# Patient Record
Sex: Male | Born: 1945
Health system: Southern US, Community
[De-identification: ages and names within clinical notes are randomized; demographics above are authoritative.]

## PROBLEM LIST (undated history)

## (undated) DIAGNOSIS — Z9889 Other specified postprocedural states: Secondary | ICD-10-CM

## (undated) DIAGNOSIS — G4733 Obstructive sleep apnea (adult) (pediatric): Secondary | ICD-10-CM

## (undated) DIAGNOSIS — K5732 Diverticulitis of large intestine without perforation or abscess without bleeding: Secondary | ICD-10-CM

## (undated) DIAGNOSIS — G4730 Sleep apnea, unspecified: Secondary | ICD-10-CM

## (undated) DIAGNOSIS — F528 Other sexual dysfunction not due to a substance or known physiological condition: Secondary | ICD-10-CM

## (undated) DIAGNOSIS — K439 Ventral hernia without obstruction or gangrene: Secondary | ICD-10-CM

## (undated) DIAGNOSIS — C61 Malignant neoplasm of prostate: Secondary | ICD-10-CM

## (undated) DIAGNOSIS — I739 Peripheral vascular disease, unspecified: Secondary | ICD-10-CM

## (undated) DIAGNOSIS — E785 Hyperlipidemia, unspecified: Secondary | ICD-10-CM

## (undated) DIAGNOSIS — R5383 Other fatigue: Secondary | ICD-10-CM

## (undated) DIAGNOSIS — M545 Low back pain, unspecified: Secondary | ICD-10-CM

## (undated) DIAGNOSIS — N183 Chronic kidney disease, stage 3 unspecified: Secondary | ICD-10-CM

## (undated) DIAGNOSIS — K219 Gastro-esophageal reflux disease without esophagitis: Secondary | ICD-10-CM

## (undated) DIAGNOSIS — M129 Arthropathy, unspecified: Secondary | ICD-10-CM

## (undated) DIAGNOSIS — R5381 Other malaise: Secondary | ICD-10-CM

## (undated) DIAGNOSIS — L732 Hidradenitis suppurativa: Secondary | ICD-10-CM

## (undated) DIAGNOSIS — I7389 Other specified peripheral vascular diseases: Secondary | ICD-10-CM

## (undated) DIAGNOSIS — I10 Essential (primary) hypertension: Secondary | ICD-10-CM

## (undated) DIAGNOSIS — I252 Old myocardial infarction: Secondary | ICD-10-CM

## (undated) DIAGNOSIS — E669 Obesity, unspecified: Secondary | ICD-10-CM

## (undated) DIAGNOSIS — E1149 Type 2 diabetes mellitus with other diabetic neurological complication: Secondary | ICD-10-CM

## (undated) DIAGNOSIS — I2510 Atherosclerotic heart disease of native coronary artery without angina pectoris: Secondary | ICD-10-CM

## (undated) HISTORY — DX: Other malaise: R53.81

## (undated) HISTORY — PX: APPENDECTOMY: SHX54

## (undated) HISTORY — DX: Obstructive sleep apnea (adult) (pediatric): G47.33

## (undated) HISTORY — DX: Old myocardial infarction: I25.2

## (undated) HISTORY — DX: Low back pain: M54.5

## (undated) HISTORY — DX: Other sexual dysfunction not due to a substance or known physiological condition: F52.8

## (undated) HISTORY — DX: Peripheral vascular disease, unspecified: I73.9

## (undated) HISTORY — PX: ABDOMINAL SURGERY: SHX537

## (undated) HISTORY — DX: Obesity, unspecified: E66.9

## (undated) HISTORY — DX: Malignant neoplasm of prostate: C61

## (undated) HISTORY — DX: Other fatigue: R53.83

## (undated) HISTORY — DX: Essential (primary) hypertension: I10

## (undated) HISTORY — DX: Diverticulitis of large intestine without perforation or abscess without bleeding: K57.32

## (undated) HISTORY — DX: Chronic kidney disease, stage 3 unspecified: N18.30

## (undated) HISTORY — DX: Sleep apnea, unspecified: G47.30

## (undated) HISTORY — DX: Ventral hernia without obstruction or gangrene: K43.9

## (undated) HISTORY — DX: Atherosclerotic heart disease of native coronary artery without angina pectoris: I25.10

## (undated) HISTORY — DX: Low back pain, unspecified: M54.50

## (undated) HISTORY — DX: Other specified postprocedural states: Z98.890

## (undated) HISTORY — DX: Chronic kidney disease, stage 3 (moderate): N18.3

## (undated) HISTORY — DX: Arthropathy, unspecified: M12.9

## (undated) HISTORY — DX: Other specified peripheral vascular diseases: I73.89

## (undated) HISTORY — PX: HERNIA REPAIR: SHX51

## (undated) HISTORY — DX: Type 2 diabetes mellitus with other diabetic neurological complication: E11.49

## (undated) HISTORY — PX: WRIST SURGERY: SHX841

## (undated) HISTORY — DX: Hyperlipidemia, unspecified: E78.5

---

## 1998-03-04 ENCOUNTER — Encounter: Admission: RE | Admit: 1998-03-04 | Discharge: 1998-03-04 | Payer: Self-pay | Admitting: Family Medicine

## 1998-03-07 ENCOUNTER — Encounter: Admission: RE | Admit: 1998-03-07 | Discharge: 1998-03-07 | Payer: Self-pay | Admitting: Family Medicine

## 1998-03-13 ENCOUNTER — Encounter: Admission: RE | Admit: 1998-03-13 | Discharge: 1998-03-13 | Payer: Self-pay | Admitting: Family Medicine

## 1998-03-24 ENCOUNTER — Encounter: Admission: RE | Admit: 1998-03-24 | Discharge: 1998-03-24 | Payer: Self-pay | Admitting: Family Medicine

## 1998-06-30 ENCOUNTER — Encounter: Admission: RE | Admit: 1998-06-30 | Discharge: 1998-06-30 | Payer: Self-pay | Admitting: Family Medicine

## 1998-09-12 ENCOUNTER — Encounter: Admission: RE | Admit: 1998-09-12 | Discharge: 1998-09-12 | Payer: Self-pay | Admitting: Family Medicine

## 1998-11-01 HISTORY — PX: JOINT REPLACEMENT: SHX530

## 1998-11-01 HISTORY — PX: TOTAL KNEE ARTHROPLASTY: SHX125

## 1998-12-18 ENCOUNTER — Encounter: Admission: RE | Admit: 1998-12-18 | Discharge: 1998-12-18 | Payer: Self-pay | Admitting: Family Medicine

## 1999-01-02 ENCOUNTER — Encounter: Admission: RE | Admit: 1999-01-02 | Discharge: 1999-01-02 | Payer: Self-pay | Admitting: Family Medicine

## 1999-01-21 ENCOUNTER — Encounter: Admission: RE | Admit: 1999-01-21 | Discharge: 1999-01-21 | Payer: Self-pay | Admitting: Family Medicine

## 1999-05-08 ENCOUNTER — Encounter: Admission: RE | Admit: 1999-05-08 | Discharge: 1999-05-08 | Payer: Self-pay | Admitting: Family Medicine

## 1999-10-14 ENCOUNTER — Encounter: Payer: Self-pay | Admitting: Orthopedic Surgery

## 1999-10-15 ENCOUNTER — Encounter: Admission: RE | Admit: 1999-10-15 | Discharge: 1999-10-15 | Payer: Self-pay | Admitting: Family Medicine

## 1999-10-19 ENCOUNTER — Encounter: Payer: Self-pay | Admitting: Orthopedic Surgery

## 1999-10-19 ENCOUNTER — Inpatient Hospital Stay (HOSPITAL_COMMUNITY): Admission: RE | Admit: 1999-10-19 | Discharge: 1999-10-23 | Payer: Self-pay | Admitting: Orthopedic Surgery

## 1999-10-23 ENCOUNTER — Inpatient Hospital Stay (HOSPITAL_COMMUNITY)
Admission: RE | Admit: 1999-10-23 | Discharge: 1999-10-28 | Payer: Self-pay | Admitting: Physical Medicine & Rehabilitation

## 1999-11-02 DIAGNOSIS — I252 Old myocardial infarction: Secondary | ICD-10-CM

## 1999-11-02 HISTORY — DX: Old myocardial infarction: I25.2

## 1999-11-20 ENCOUNTER — Encounter: Admission: RE | Admit: 1999-11-20 | Discharge: 1999-11-20 | Payer: Self-pay | Admitting: Family Medicine

## 1999-12-01 ENCOUNTER — Encounter: Admission: RE | Admit: 1999-12-01 | Discharge: 1999-12-01 | Payer: Self-pay | Admitting: Sports Medicine

## 1999-12-02 ENCOUNTER — Encounter: Admission: RE | Admit: 1999-12-02 | Discharge: 2000-01-01 | Payer: Self-pay | Admitting: Neurology

## 2000-01-11 ENCOUNTER — Encounter: Admission: RE | Admit: 2000-01-11 | Discharge: 2000-01-11 | Payer: Self-pay | Admitting: Family Medicine

## 2000-02-29 ENCOUNTER — Inpatient Hospital Stay (HOSPITAL_COMMUNITY): Admission: EM | Admit: 2000-02-29 | Discharge: 2000-03-04 | Payer: Self-pay | Admitting: Emergency Medicine

## 2000-02-29 ENCOUNTER — Encounter: Payer: Self-pay | Admitting: Emergency Medicine

## 2000-04-06 ENCOUNTER — Encounter: Admission: RE | Admit: 2000-04-06 | Discharge: 2000-04-06 | Payer: Self-pay | Admitting: Family Medicine

## 2000-10-03 ENCOUNTER — Encounter: Admission: RE | Admit: 2000-10-03 | Discharge: 2000-10-03 | Payer: Self-pay | Admitting: Family Medicine

## 2001-01-30 ENCOUNTER — Encounter: Admission: RE | Admit: 2001-01-30 | Discharge: 2001-01-30 | Payer: Self-pay | Admitting: Family Medicine

## 2001-06-30 ENCOUNTER — Encounter: Admission: RE | Admit: 2001-06-30 | Discharge: 2001-06-30 | Payer: Self-pay | Admitting: Family Medicine

## 2001-07-11 ENCOUNTER — Encounter: Admission: RE | Admit: 2001-07-11 | Discharge: 2001-07-11 | Payer: Self-pay | Admitting: Family Medicine

## 2001-10-09 ENCOUNTER — Encounter: Admission: RE | Admit: 2001-10-09 | Discharge: 2001-10-09 | Payer: Self-pay | Admitting: Family Medicine

## 2001-10-09 ENCOUNTER — Encounter: Admission: RE | Admit: 2001-10-09 | Discharge: 2001-10-09 | Payer: Self-pay | Admitting: Sports Medicine

## 2002-01-24 ENCOUNTER — Encounter: Admission: RE | Admit: 2002-01-24 | Discharge: 2002-01-24 | Payer: Self-pay | Admitting: Family Medicine

## 2002-01-30 ENCOUNTER — Encounter: Admission: RE | Admit: 2002-01-30 | Discharge: 2002-01-30 | Payer: Self-pay | Admitting: Family Medicine

## 2002-02-13 ENCOUNTER — Encounter: Admission: RE | Admit: 2002-02-13 | Discharge: 2002-02-13 | Payer: Self-pay | Admitting: Family Medicine

## 2002-06-15 ENCOUNTER — Encounter: Admission: RE | Admit: 2002-06-15 | Discharge: 2002-06-15 | Payer: Self-pay | Admitting: Family Medicine

## 2002-07-30 ENCOUNTER — Encounter: Admission: RE | Admit: 2002-07-30 | Discharge: 2002-07-30 | Payer: Self-pay | Admitting: Family Medicine

## 2002-08-15 ENCOUNTER — Ambulatory Visit (HOSPITAL_COMMUNITY): Admission: RE | Admit: 2002-08-15 | Discharge: 2002-08-15 | Payer: Self-pay | Admitting: *Deleted

## 2002-08-15 ENCOUNTER — Encounter (INDEPENDENT_AMBULATORY_CARE_PROVIDER_SITE_OTHER): Payer: Self-pay | Admitting: Specialist

## 2002-11-30 ENCOUNTER — Encounter: Admission: RE | Admit: 2002-11-30 | Discharge: 2002-11-30 | Payer: Self-pay | Admitting: Family Medicine

## 2002-12-21 ENCOUNTER — Encounter: Admission: RE | Admit: 2002-12-21 | Discharge: 2002-12-21 | Payer: Self-pay | Admitting: Family Medicine

## 2003-01-17 ENCOUNTER — Encounter: Admission: RE | Admit: 2003-01-17 | Discharge: 2003-01-17 | Payer: Self-pay | Admitting: Family Medicine

## 2003-02-18 ENCOUNTER — Encounter: Admission: RE | Admit: 2003-02-18 | Discharge: 2003-02-18 | Payer: Self-pay | Admitting: Family Medicine

## 2003-07-31 ENCOUNTER — Encounter: Admission: RE | Admit: 2003-07-31 | Discharge: 2003-07-31 | Payer: Self-pay | Admitting: Family Medicine

## 2003-08-09 ENCOUNTER — Encounter: Admission: RE | Admit: 2003-08-09 | Discharge: 2003-08-09 | Payer: Self-pay | Admitting: Sports Medicine

## 2004-02-24 ENCOUNTER — Encounter: Admission: RE | Admit: 2004-02-24 | Discharge: 2004-02-24 | Payer: Self-pay | Admitting: Family Medicine

## 2004-02-24 ENCOUNTER — Ambulatory Visit (HOSPITAL_COMMUNITY): Admission: RE | Admit: 2004-02-24 | Discharge: 2004-02-24 | Payer: Self-pay | Admitting: Sports Medicine

## 2004-02-25 ENCOUNTER — Encounter: Admission: RE | Admit: 2004-02-25 | Discharge: 2004-02-25 | Payer: Self-pay | Admitting: Sports Medicine

## 2004-02-26 ENCOUNTER — Encounter: Admission: RE | Admit: 2004-02-26 | Discharge: 2004-02-26 | Payer: Self-pay | Admitting: Family Medicine

## 2004-03-03 ENCOUNTER — Encounter: Admission: RE | Admit: 2004-03-03 | Discharge: 2004-03-03 | Payer: Self-pay | Admitting: Family Medicine

## 2004-07-20 ENCOUNTER — Ambulatory Visit: Payer: Self-pay | Admitting: Family Medicine

## 2005-05-06 ENCOUNTER — Ambulatory Visit: Payer: Self-pay | Admitting: Family Medicine

## 2005-06-03 ENCOUNTER — Ambulatory Visit: Payer: Self-pay | Admitting: Sports Medicine

## 2005-06-15 ENCOUNTER — Emergency Department (HOSPITAL_COMMUNITY): Admission: EM | Admit: 2005-06-15 | Discharge: 2005-06-15 | Payer: Self-pay | Admitting: Emergency Medicine

## 2005-09-17 ENCOUNTER — Ambulatory Visit: Payer: Self-pay | Admitting: Family Medicine

## 2006-03-11 ENCOUNTER — Ambulatory Visit: Payer: Self-pay | Admitting: Family Medicine

## 2006-03-30 ENCOUNTER — Ambulatory Visit: Payer: Self-pay | Admitting: Sports Medicine

## 2006-04-18 ENCOUNTER — Ambulatory Visit: Payer: Self-pay | Admitting: Family Medicine

## 2006-05-03 ENCOUNTER — Ambulatory Visit: Payer: Self-pay | Admitting: Sports Medicine

## 2006-06-09 ENCOUNTER — Ambulatory Visit (HOSPITAL_COMMUNITY): Admission: RE | Admit: 2006-06-09 | Discharge: 2006-06-09 | Payer: Self-pay | Admitting: Urology

## 2006-06-15 ENCOUNTER — Ambulatory Visit (HOSPITAL_COMMUNITY): Admission: RE | Admit: 2006-06-15 | Discharge: 2006-06-15 | Payer: Self-pay | Admitting: Vascular Surgery

## 2006-06-17 ENCOUNTER — Encounter: Payer: Self-pay | Admitting: Cardiology

## 2006-06-17 ENCOUNTER — Ambulatory Visit: Payer: Self-pay

## 2006-06-21 ENCOUNTER — Inpatient Hospital Stay (HOSPITAL_COMMUNITY): Admission: RE | Admit: 2006-06-21 | Discharge: 2006-07-02 | Payer: Self-pay | Admitting: Vascular Surgery

## 2006-06-21 ENCOUNTER — Encounter (INDEPENDENT_AMBULATORY_CARE_PROVIDER_SITE_OTHER): Payer: Self-pay | Admitting: Specialist

## 2006-06-23 ENCOUNTER — Encounter: Payer: Self-pay | Admitting: Vascular Surgery

## 2006-08-01 ENCOUNTER — Ambulatory Visit: Admission: RE | Admit: 2006-08-01 | Discharge: 2006-10-30 | Payer: Self-pay | Admitting: Radiation Oncology

## 2006-08-10 ENCOUNTER — Ambulatory Visit: Payer: Self-pay | Admitting: Family Medicine

## 2006-09-05 ENCOUNTER — Ambulatory Visit: Payer: Self-pay | Admitting: Family Medicine

## 2006-09-14 ENCOUNTER — Ambulatory Visit: Payer: Self-pay | Admitting: Sports Medicine

## 2006-10-31 ENCOUNTER — Ambulatory Visit: Admission: RE | Admit: 2006-10-31 | Discharge: 2007-01-29 | Payer: Self-pay | Admitting: Radiation Oncology

## 2006-12-29 DIAGNOSIS — E119 Type 2 diabetes mellitus without complications: Secondary | ICD-10-CM

## 2006-12-29 DIAGNOSIS — M1711 Unilateral primary osteoarthritis, right knee: Secondary | ICD-10-CM | POA: Insufficient documentation

## 2006-12-29 DIAGNOSIS — E1140 Type 2 diabetes mellitus with diabetic neuropathy, unspecified: Secondary | ICD-10-CM | POA: Insufficient documentation

## 2006-12-29 DIAGNOSIS — M109 Gout, unspecified: Secondary | ICD-10-CM | POA: Insufficient documentation

## 2006-12-29 DIAGNOSIS — I252 Old myocardial infarction: Secondary | ICD-10-CM

## 2006-12-29 DIAGNOSIS — I2510 Atherosclerotic heart disease of native coronary artery without angina pectoris: Secondary | ICD-10-CM

## 2006-12-29 DIAGNOSIS — C61 Malignant neoplasm of prostate: Secondary | ICD-10-CM | POA: Insufficient documentation

## 2006-12-29 DIAGNOSIS — I10 Essential (primary) hypertension: Secondary | ICD-10-CM | POA: Insufficient documentation

## 2006-12-29 DIAGNOSIS — I739 Peripheral vascular disease, unspecified: Secondary | ICD-10-CM | POA: Insufficient documentation

## 2006-12-29 DIAGNOSIS — K5732 Diverticulitis of large intestine without perforation or abscess without bleeding: Secondary | ICD-10-CM | POA: Insufficient documentation

## 2006-12-29 DIAGNOSIS — E785 Hyperlipidemia, unspecified: Secondary | ICD-10-CM | POA: Insufficient documentation

## 2006-12-29 DIAGNOSIS — F528 Other sexual dysfunction not due to a substance or known physiological condition: Secondary | ICD-10-CM

## 2007-01-09 ENCOUNTER — Ambulatory Visit: Payer: Self-pay | Admitting: Family Medicine

## 2007-02-03 ENCOUNTER — Encounter (INDEPENDENT_AMBULATORY_CARE_PROVIDER_SITE_OTHER): Payer: Self-pay | Admitting: Family Medicine

## 2007-03-07 ENCOUNTER — Ambulatory Visit: Payer: Self-pay | Admitting: Family Medicine

## 2007-04-03 ENCOUNTER — Telehealth (INDEPENDENT_AMBULATORY_CARE_PROVIDER_SITE_OTHER): Payer: Self-pay | Admitting: Family Medicine

## 2007-04-11 ENCOUNTER — Ambulatory Visit: Payer: Self-pay | Admitting: Sports Medicine

## 2007-04-11 LAB — CONVERTED CEMR LAB: Hgb A1c MFr Bld: 9 %

## 2007-05-12 ENCOUNTER — Encounter: Payer: Self-pay | Admitting: *Deleted

## 2007-06-16 ENCOUNTER — Encounter (INDEPENDENT_AMBULATORY_CARE_PROVIDER_SITE_OTHER): Payer: Self-pay | Admitting: Family Medicine

## 2007-06-28 ENCOUNTER — Telehealth (INDEPENDENT_AMBULATORY_CARE_PROVIDER_SITE_OTHER): Payer: Self-pay | Admitting: Family Medicine

## 2007-07-16 IMAGING — CR DG ABDOMEN 2V
2 series · 2 of 2 positions shown · non-contrast
Comparison: 06/28/2006

CLINICAL DATA: Abdominal aortic aneurysm. Nausea and vomiting.

[w abdomen upright *]
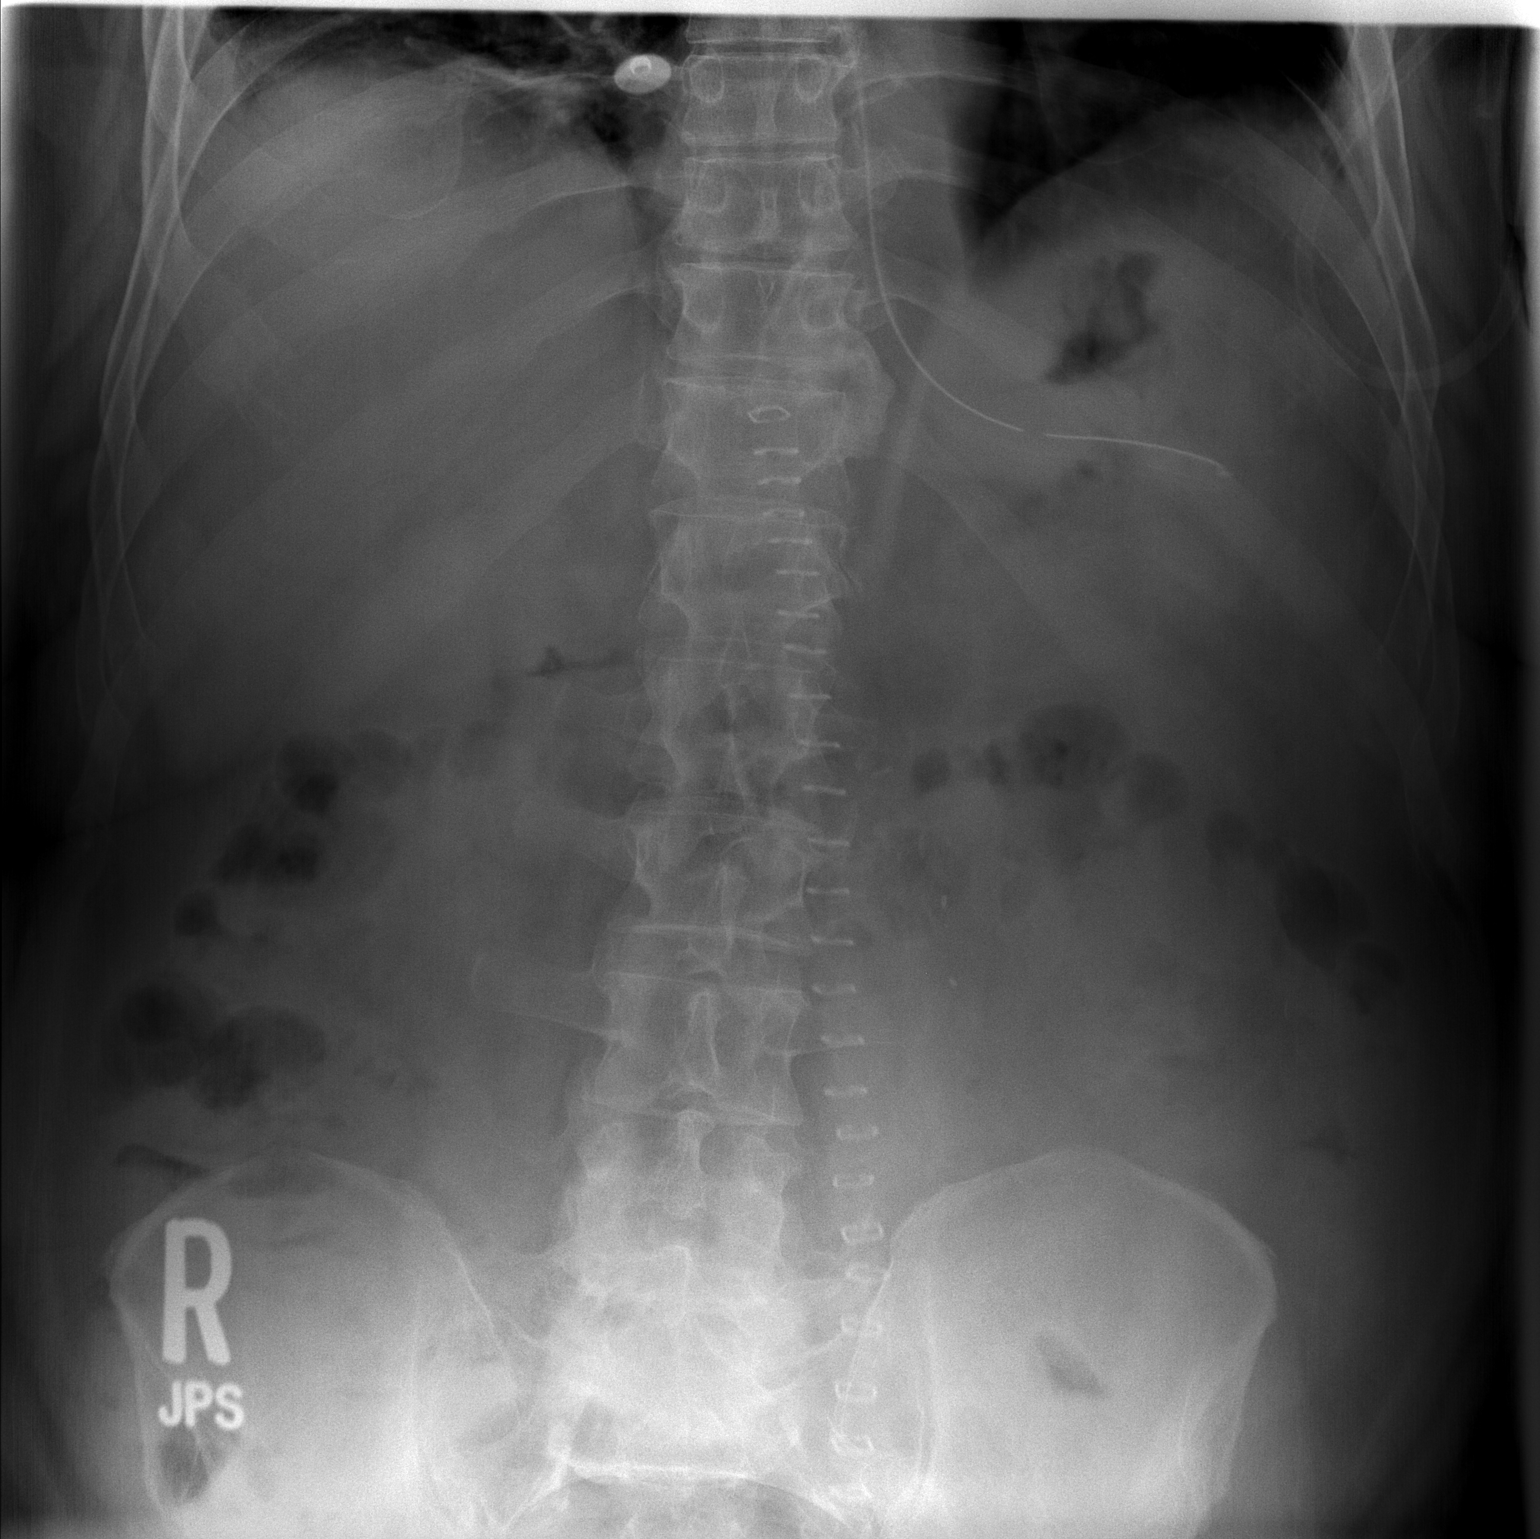

[t abdomen supine]
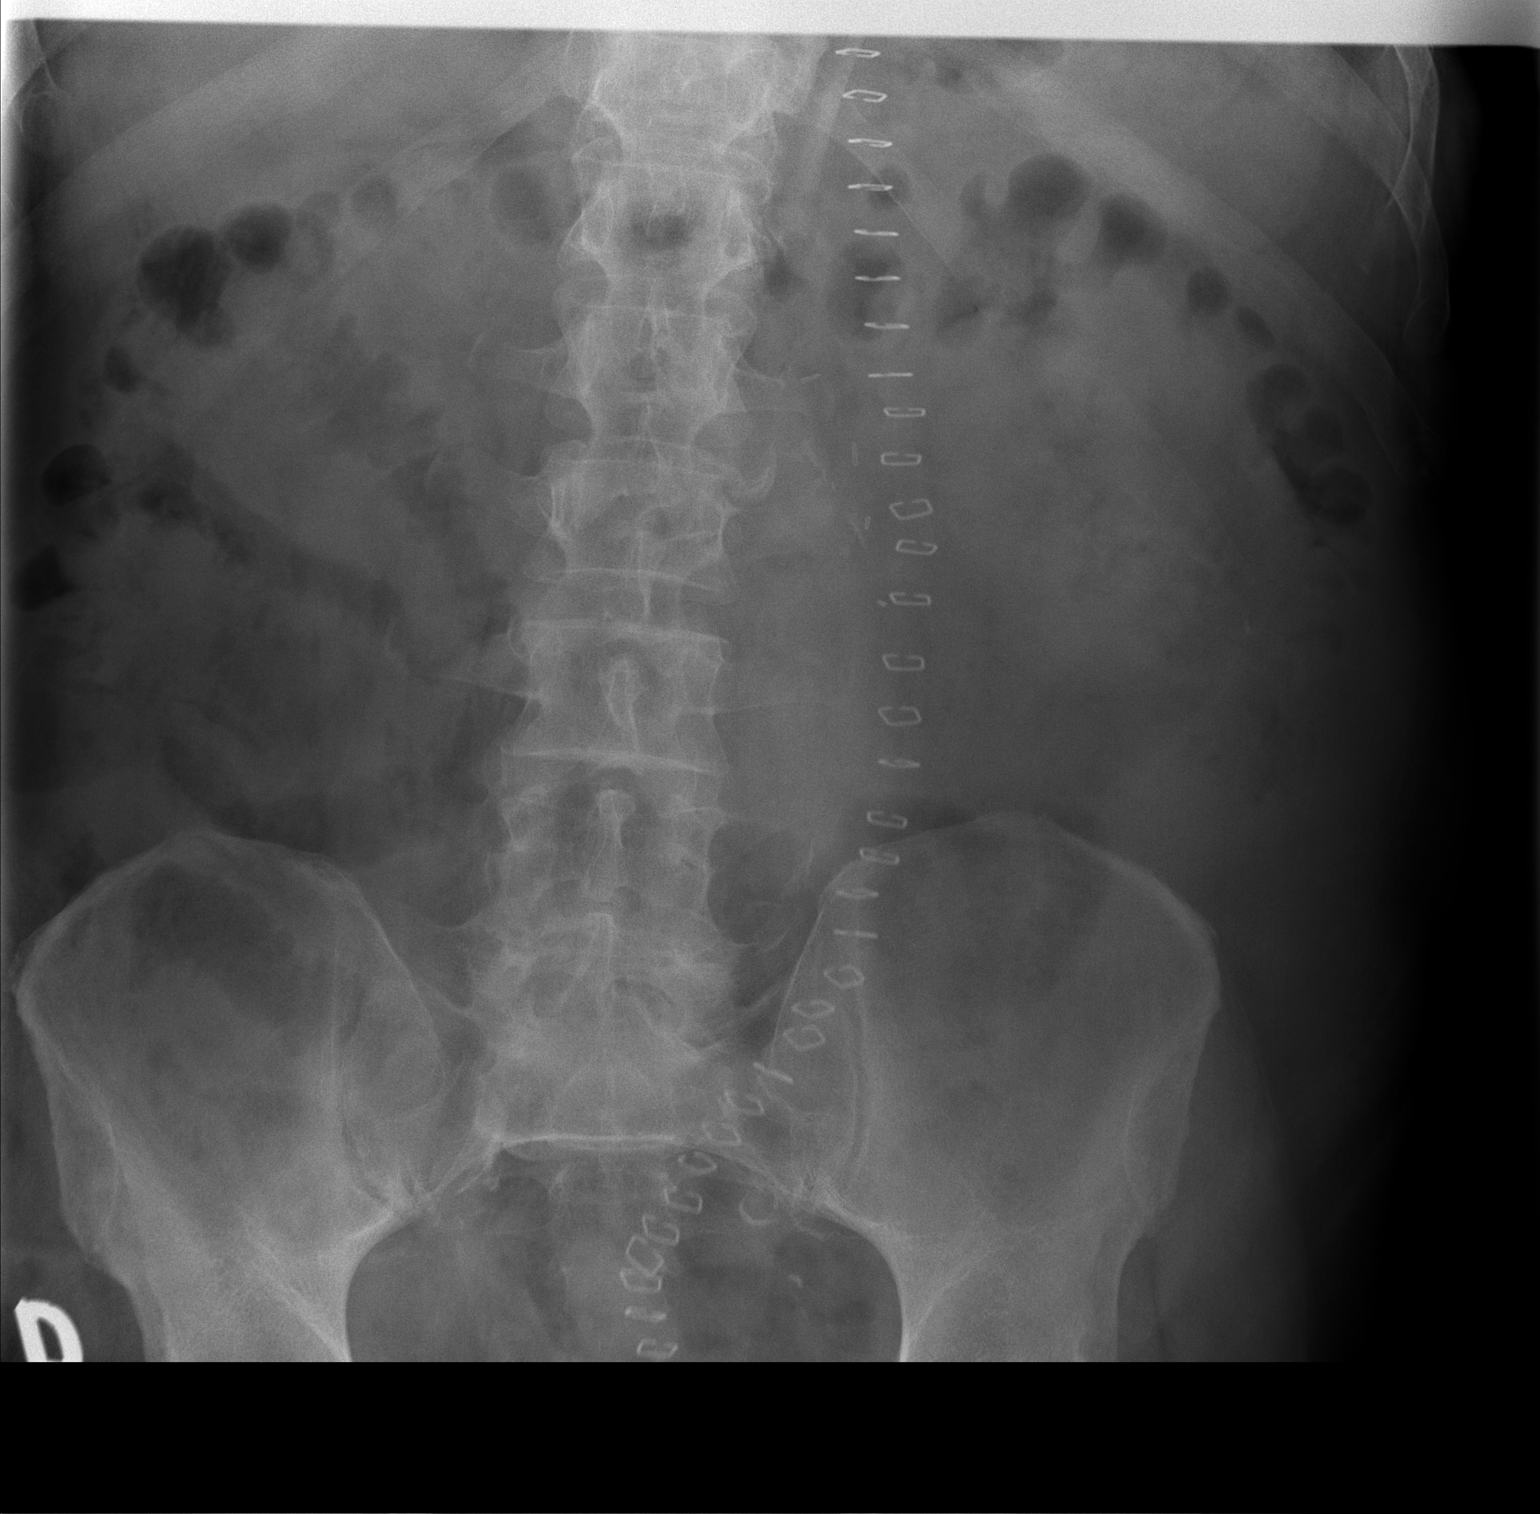

[2 of 2 positions shown; findings below may reference images not displayed]

ABDOMEN - 2 VIEW:

No evidence for intraperitoneal free air. Atelectasis noted at the right lung
base with a tiny right effusion. NG tube overlies the gastric fundus.

There is no gaseous bowel dilation on the current study. There are some
nondistended somewhat fixed bowel loops in the right abdomen which are stable
over multiple prior exams back to 06/26/2006. Although nondistended on the
current study, the fixed position may related to wall thickening or inflammatory
change. Clinical correlation will be required
IMPRESSION: No bowel dilation.

Several fixed bowel loops in the right abdomen appear to represent small bowel
based on earlier studies. Fixed position can be related to wall thickening or
inflammation. Please correlate clinically.

## 2007-08-15 ENCOUNTER — Ambulatory Visit: Payer: Self-pay | Admitting: Family Medicine

## 2007-08-18 ENCOUNTER — Telehealth (INDEPENDENT_AMBULATORY_CARE_PROVIDER_SITE_OTHER): Payer: Self-pay | Admitting: Family Medicine

## 2007-08-22 ENCOUNTER — Telehealth: Payer: Self-pay | Admitting: *Deleted

## 2007-09-05 ENCOUNTER — Ambulatory Visit: Payer: Self-pay | Admitting: Family Medicine

## 2007-09-05 ENCOUNTER — Encounter: Admission: RE | Admit: 2007-09-05 | Discharge: 2007-09-05 | Payer: Self-pay | Admitting: Sports Medicine

## 2007-09-05 DIAGNOSIS — M545 Low back pain, unspecified: Secondary | ICD-10-CM | POA: Insufficient documentation

## 2007-09-18 ENCOUNTER — Encounter (INDEPENDENT_AMBULATORY_CARE_PROVIDER_SITE_OTHER): Payer: Self-pay | Admitting: Family Medicine

## 2007-09-18 ENCOUNTER — Ambulatory Visit: Payer: Self-pay | Admitting: Family Medicine

## 2007-09-24 LAB — CONVERTED CEMR LAB
AST: 13 units/L (ref 0–37)
Albumin: 4.6 g/dL (ref 3.5–5.2)
Alkaline Phosphatase: 55 units/L (ref 39–117)
Calcium: 10.1 mg/dL (ref 8.4–10.5)
Creatinine, Ser: 1.43 mg/dL (ref 0.40–1.50)
Total Protein: 7.7 g/dL (ref 6.0–8.3)

## 2007-10-04 ENCOUNTER — Encounter (INDEPENDENT_AMBULATORY_CARE_PROVIDER_SITE_OTHER): Payer: Self-pay | Admitting: Family Medicine

## 2007-10-04 ENCOUNTER — Ambulatory Visit: Payer: Self-pay | Admitting: Family Medicine

## 2007-10-05 LAB — CONVERTED CEMR LAB
Total CHOL/HDL Ratio: 4.9
Triglycerides: 227 mg/dL — ABNORMAL HIGH (ref <150)
VLDL: 45 mg/dL — ABNORMAL HIGH (ref 0–40)

## 2007-10-10 ENCOUNTER — Encounter (INDEPENDENT_AMBULATORY_CARE_PROVIDER_SITE_OTHER): Payer: Self-pay | Admitting: Family Medicine

## 2007-11-02 HISTORY — PX: ABDOMINAL AORTIC ANEURYSM REPAIR: SUR1152

## 2007-11-23 ENCOUNTER — Encounter (INDEPENDENT_AMBULATORY_CARE_PROVIDER_SITE_OTHER): Payer: Self-pay | Admitting: Family Medicine

## 2007-11-24 ENCOUNTER — Telehealth: Payer: Self-pay | Admitting: *Deleted

## 2007-11-24 ENCOUNTER — Ambulatory Visit: Payer: Self-pay | Admitting: Family Medicine

## 2007-12-13 ENCOUNTER — Ambulatory Visit: Payer: Self-pay | Admitting: Family Medicine

## 2007-12-25 ENCOUNTER — Telehealth (INDEPENDENT_AMBULATORY_CARE_PROVIDER_SITE_OTHER): Payer: Self-pay | Admitting: Family Medicine

## 2008-01-12 ENCOUNTER — Ambulatory Visit: Payer: Self-pay | Admitting: Family Medicine

## 2008-01-12 ENCOUNTER — Telehealth (INDEPENDENT_AMBULATORY_CARE_PROVIDER_SITE_OTHER): Payer: Self-pay | Admitting: *Deleted

## 2008-01-12 ENCOUNTER — Encounter (INDEPENDENT_AMBULATORY_CARE_PROVIDER_SITE_OTHER): Payer: Self-pay | Admitting: Family Medicine

## 2008-01-12 DIAGNOSIS — N183 Chronic kidney disease, stage 3 (moderate): Secondary | ICD-10-CM

## 2008-01-12 LAB — CONVERTED CEMR LAB
BUN: 20 mg/dL (ref 6–23)
Calcium: 9.9 mg/dL (ref 8.4–10.5)
Creatinine, Ser: 1.23 mg/dL (ref 0.40–1.50)
Hgb A1c MFr Bld: 9 %
Sodium: 139 meq/L (ref 135–145)

## 2008-02-12 ENCOUNTER — Encounter (INDEPENDENT_AMBULATORY_CARE_PROVIDER_SITE_OTHER): Payer: Self-pay | Admitting: Family Medicine

## 2008-02-22 ENCOUNTER — Encounter (INDEPENDENT_AMBULATORY_CARE_PROVIDER_SITE_OTHER): Payer: Self-pay | Admitting: *Deleted

## 2008-04-25 ENCOUNTER — Encounter (INDEPENDENT_AMBULATORY_CARE_PROVIDER_SITE_OTHER): Payer: Self-pay | Admitting: Family Medicine

## 2008-04-25 ENCOUNTER — Ambulatory Visit: Payer: Self-pay | Admitting: Family Medicine

## 2008-04-25 LAB — CONVERTED CEMR LAB
BUN: 29 mg/dL — ABNORMAL HIGH (ref 6–23)
Calcium: 9.9 mg/dL (ref 8.4–10.5)
Creatinine, Ser: 1.4 mg/dL (ref 0.40–1.50)
Glucose, Bld: 104 mg/dL — ABNORMAL HIGH (ref 70–99)
Hgb A1c MFr Bld: 7 %
Sodium: 140 meq/L (ref 135–145)

## 2008-08-29 ENCOUNTER — Encounter (INDEPENDENT_AMBULATORY_CARE_PROVIDER_SITE_OTHER): Payer: Self-pay | Admitting: Family Medicine

## 2008-08-29 ENCOUNTER — Ambulatory Visit: Payer: Self-pay | Admitting: Family Medicine

## 2008-08-29 LAB — CONVERTED CEMR LAB
Calcium: 9.3 mg/dL (ref 8.4–10.5)
Direct LDL: 101 mg/dL — ABNORMAL HIGH
Sodium: 136 meq/L (ref 135–145)

## 2008-10-16 ENCOUNTER — Encounter (INDEPENDENT_AMBULATORY_CARE_PROVIDER_SITE_OTHER): Payer: Self-pay | Admitting: Family Medicine

## 2008-11-29 ENCOUNTER — Encounter (INDEPENDENT_AMBULATORY_CARE_PROVIDER_SITE_OTHER): Payer: Self-pay | Admitting: Family Medicine

## 2008-11-29 ENCOUNTER — Ambulatory Visit: Payer: Self-pay | Admitting: Family Medicine

## 2008-11-29 LAB — CONVERTED CEMR LAB
AST: 12 units/L (ref 0–37)
Albumin: 4.1 g/dL (ref 3.5–5.2)
Alkaline Phosphatase: 56 units/L (ref 39–117)
BUN: 21 mg/dL (ref 6–23)
Creatinine, Ser: 1.38 mg/dL (ref 0.40–1.50)
Glucose, Bld: 158 mg/dL — ABNORMAL HIGH (ref 70–99)
Hgb A1c MFr Bld: 8.4 %
MCHC: 32.5 g/dL (ref 30.0–36.0)
Platelets: 275 10*3/uL (ref 150–400)
TSH: 2.303 microintl units/mL (ref 0.350–4.50)
WBC: 6.8 10*3/uL (ref 4.0–10.5)

## 2008-11-30 ENCOUNTER — Encounter (INDEPENDENT_AMBULATORY_CARE_PROVIDER_SITE_OTHER): Payer: Self-pay | Admitting: Family Medicine

## 2008-12-30 LAB — CONVERTED CEMR LAB

## 2009-01-01 ENCOUNTER — Ambulatory Visit: Payer: Self-pay | Admitting: Family Medicine

## 2009-01-01 DIAGNOSIS — E669 Obesity, unspecified: Secondary | ICD-10-CM | POA: Insufficient documentation

## 2009-01-20 ENCOUNTER — Encounter (INDEPENDENT_AMBULATORY_CARE_PROVIDER_SITE_OTHER): Payer: Self-pay | Admitting: Family Medicine

## 2009-03-05 ENCOUNTER — Ambulatory Visit: Payer: Self-pay | Admitting: Family Medicine

## 2009-03-05 DIAGNOSIS — Z9889 Other specified postprocedural states: Secondary | ICD-10-CM | POA: Insufficient documentation

## 2009-03-05 DIAGNOSIS — I739 Peripheral vascular disease, unspecified: Secondary | ICD-10-CM

## 2009-03-05 LAB — CONVERTED CEMR LAB: Hgb A1c MFr Bld: 7.3 %

## 2009-05-28 ENCOUNTER — Encounter: Payer: Self-pay | Admitting: Family Medicine

## 2009-09-08 ENCOUNTER — Ambulatory Visit: Payer: Self-pay | Admitting: Family Medicine

## 2009-09-08 ENCOUNTER — Encounter: Payer: Self-pay | Admitting: Family Medicine

## 2009-09-08 LAB — CONVERTED CEMR LAB
CO2: 19 meq/L (ref 19–32)
Calcium: 9 mg/dL (ref 8.4–10.5)
Chloride: 100 meq/L (ref 96–112)
Creatinine, Ser: 1.28 mg/dL (ref 0.40–1.50)
HDL: 30 mg/dL — ABNORMAL LOW (ref >39)
Potassium: 4.4 meq/L (ref 3.5–5.3)
Sodium: 137 meq/L (ref 135–145)
Total CHOL/HDL Ratio: 5.4
Total Protein: 7.2 g/dL (ref 6.0–8.3)
Triglycerides: 255 mg/dL — ABNORMAL HIGH (ref <150)

## 2009-12-03 ENCOUNTER — Encounter: Payer: Self-pay | Admitting: Family Medicine

## 2009-12-30 ENCOUNTER — Encounter: Payer: Self-pay | Admitting: Family Medicine

## 2009-12-30 ENCOUNTER — Ambulatory Visit: Payer: Self-pay | Admitting: Family Medicine

## 2009-12-30 DIAGNOSIS — G4730 Sleep apnea, unspecified: Secondary | ICD-10-CM | POA: Insufficient documentation

## 2009-12-30 DIAGNOSIS — R5381 Other malaise: Secondary | ICD-10-CM | POA: Insufficient documentation

## 2009-12-30 DIAGNOSIS — R5383 Other fatigue: Secondary | ICD-10-CM

## 2009-12-30 LAB — CONVERTED CEMR LAB
Blood in Urine, dipstick: NEGATIVE
Free T4: 1.54 ng/dL (ref 0.80–1.80)
Glucose, Urine, Semiquant: NEGATIVE
Hgb A1c MFr Bld: 7.6 %
Ketones, urine, test strip: NEGATIVE
Specific Gravity, Urine: 1.02
TSH: 3.052 microintl units/mL (ref 0.350–4.500)
Urobilinogen, UA: 0.2
WBC Urine, dipstick: NEGATIVE
pH: 5.5

## 2010-01-20 ENCOUNTER — Ambulatory Visit (HOSPITAL_BASED_OUTPATIENT_CLINIC_OR_DEPARTMENT_OTHER): Admission: RE | Admit: 2010-01-20 | Discharge: 2010-01-20 | Payer: Self-pay | Admitting: Family Medicine

## 2010-01-21 ENCOUNTER — Encounter: Admission: RE | Admit: 2010-01-21 | Discharge: 2010-01-21 | Payer: Self-pay | Admitting: Family Medicine

## 2010-01-27 ENCOUNTER — Ambulatory Visit: Payer: Self-pay | Admitting: Internal Medicine

## 2010-02-10 ENCOUNTER — Ambulatory Visit: Payer: Self-pay | Admitting: Family Medicine

## 2010-02-10 ENCOUNTER — Encounter: Payer: Self-pay | Admitting: Family Medicine

## 2010-02-10 ENCOUNTER — Encounter: Payer: Self-pay | Admitting: *Deleted

## 2010-02-10 DIAGNOSIS — G4733 Obstructive sleep apnea (adult) (pediatric): Secondary | ICD-10-CM

## 2010-02-10 LAB — CONVERTED CEMR LAB
Calcium: 8.9 mg/dL (ref 8.4–10.5)
Creatinine, Ser: 1.66 mg/dL — ABNORMAL HIGH (ref 0.40–1.50)
Potassium: 4.1 meq/L (ref 3.5–5.3)

## 2010-03-10 ENCOUNTER — Telehealth (INDEPENDENT_AMBULATORY_CARE_PROVIDER_SITE_OTHER): Payer: Self-pay | Admitting: *Deleted

## 2010-03-19 ENCOUNTER — Encounter: Payer: Self-pay | Admitting: Family Medicine

## 2010-03-19 ENCOUNTER — Ambulatory Visit: Payer: Self-pay | Admitting: Family Medicine

## 2010-03-19 LAB — CONVERTED CEMR LAB
Calcium: 9.6 mg/dL (ref 8.4–10.5)
Creatinine, Ser: 1.56 mg/dL — ABNORMAL HIGH (ref 0.40–1.50)
Potassium: 4.3 meq/L (ref 3.5–5.3)
Sodium: 135 meq/L (ref 135–145)

## 2010-05-14 ENCOUNTER — Encounter: Payer: Self-pay | Admitting: Family Medicine

## 2010-05-29 ENCOUNTER — Encounter: Payer: Self-pay | Admitting: Family Medicine

## 2010-05-29 ENCOUNTER — Ambulatory Visit: Payer: Self-pay | Admitting: Family Medicine

## 2010-05-29 LAB — CONVERTED CEMR LAB
BUN: 23 mg/dL (ref 6–23)
Calcium: 9.3 mg/dL (ref 8.4–10.5)
Chloride: 100 meq/L (ref 96–112)
Sodium: 134 meq/L — ABNORMAL LOW (ref 135–145)

## 2010-06-05 ENCOUNTER — Encounter: Payer: Self-pay | Admitting: Family Medicine

## 2010-06-11 ENCOUNTER — Telehealth: Payer: Self-pay | Admitting: *Deleted

## 2010-08-03 ENCOUNTER — Encounter: Payer: Self-pay | Admitting: Family Medicine

## 2010-12-01 ENCOUNTER — Ambulatory Visit: Admit: 2010-12-01 | Payer: Self-pay

## 2010-12-01 ENCOUNTER — Ambulatory Visit
Admission: RE | Admit: 2010-12-01 | Discharge: 2010-12-01 | Payer: Self-pay | Source: Home / Self Care | Attending: Family Medicine | Admitting: Family Medicine

## 2010-12-01 NOTE — Miscellaneous (Signed)
Summary: SLEEP STUDY  Clinical Lists Changes NAME:  Adam [name], Adam [name]            ACCOUNT NO.:  [id number]      MEDICAL RECORD NO.:  [id number]          PATIENT TYPE:  OUT      LOCATION:  SLEEP CENTER                 FACILITY:  [facility]      PHYSICIAN:  Clinton D. Annamaria Boots, MD, FCCP, FACPDATE OF BIRTH:  22-Mar-1947      DATE OF STUDY:  01/20/2010                               NOCTURNAL POLYSOMNOGRAM      REFERRING PHYSICIAN:  Shanda Howells, MD      INDICATION FOR STUDY:  Hypersomnia with sleep apnea.      EPWORTH SLEEPINESS SCORE:  7/24.  BMI 37.  Weight 296 pounds.  Height 75   inches.  Neck 18.5 inches.      MEDICATIONS:  Charted and reviewed.      SLEEP ARCHITECTURE:  Split study protocol.  During the diagnostic phase,   total sleep time 137.5 minutes with sleep efficiency 77.2%.  Stage I was   23.6%, stage II 76.4%, stages III and REM were absent.  Sleep latency 21   minutes, awake after sleep onset 15 minutes, arousal index 102.1   indicating increased EEG arousal.      BEDTIME MEDICATIONS:  Glucophage, metoprolol, and Crestor.      RESPIRATORY DATA:  Split study protocol.  Apnea-hypopnea index (AHI)   56.7 per hour.  A total of 130 events were scored including 22   obstructive apneas and 108 hypopneas.  Nonsupine events.  CPAP was then   titrated to 15 CWP, AHI 0 per hour.  He wore a medium ResMed Quattro   full-face mask with heated humidifier.      OXYGEN DATA:  Moderately loud snoring before CPAP with oxygen   desaturation to a nadir of 86%.  After CPAP control, mean oxygen   saturation held 90.9% on room air.  Oxygen saturation on arrival while   awake was 93% on room air.      CARDIAC DATA:  Sinus rhythm with occasional PVC.      MOVEMENT-PARASOMNIA:  No significant movement disturbance.  Bathroom x1.      IMPRESSIONS-RECOMMENDATIONS:   1. Severe obstructive sleep apnea/hypopnea syndrome, AHI 56.7 per hour       with nonsupine events, moderately loud snoring, and  oxygen       desaturation to a nadir of 86% on room air.   2. Successful continuous positive airway pressure titration to 15 CWP,       AHI 0 per hour.  He wore a medium ResMed Quattro full-face mask       with heated humidifier.               Clinton D. Annamaria Boots, MD, [facility], FACP   Diplomate, Tax adviser of Sleep Medicine   Electronically Signed            CDY/MEDQ  D:  01/24/2010 09:20:52  T:  01/24/2010 20:59:18  Job:  [id number]

## 2010-12-01 NOTE — Progress Notes (Signed)
Summary: phn msg  Phone Note Call from Patient Call back at Home Phone [phone]   Caller: spouse-[name] Summary of Call: not sure if he needs to be taking the Novalog AND Lantus or just the Novalog? Initial call taken by: Audie Clear,  Mar 10, 2010 1:58 PM  Follow-up for Phone Call        advised that yes patient is to be on both insulins.  she states he has tried  to get insulin from pharmacy but they keep trying to give him the novolog. RN called [facility] and they do have current RX for Lantus. advised wife that rx is at pharmacy and to go and pick up. Follow-up by: Marcell Barlow RN,  Mar 10, 2010 5:32 PM

## 2010-12-01 NOTE — Assessment & Plan Note (Signed)
Summary: f/u bmc   Vital Signs:  Patient profile:   65 year old male Height:      73 inches Weight:      295 pounds Temp:     98.1 degrees F oral Pulse rate:   70 / minute BP sitting:   145 / 77  (left arm) Cuff size:   large  Vitals Entered By: Amado Nash, SMA CC: F/U Is Patient Diabetic? Yes   Primary Care Provider:  Shanda Howells MD  CC:  F/U.  History of Present Illness: 7 YOM w/ PMHx/o HTN, HLD, DM here for chronic followup: DM: Pt states that blood sugars have been less controlled since d/c of glipizide per pt. Pt states that fasting BGs 130s-180s w/ post prandial BGs in 300s. Pt states that h e has not been using any sliding scale coverage with meals.  Sleep: Pt inquiring as to results of sleep study. Pt reports that hypersomnolence has been unchanged since study.     Allergies: 1)  Nitroglycerin  Physical Exam  General:  alert and overweight-appearing.   Head:  normocephalic and atraumatic.   Eyes:  vision grossly intact.   Ears:  R ear normal and L ear normal.   Nose:  no external deformity.   Mouth:  good dentition.   Lungs:  normal respiratory effort.   Heart:  normal rate, regular rhythm, and no murmur.   Abdomen:  obese abdomen, non tender, + bowel sounds   Impression & Recommendations:  Problem # 1:  OBSTRUCTIVE SLEEP APNEA (ICD-327.23) Sleep study w/ findings of severe sleep apnea. Plan to setup pt for home CPAP. Will reassess in 1 month.  Orders: Home Health Referral ([place])  Problem # 2:  DIABETES MELLITUS II, UNCOMPLICATED ([id number]) Plan to discontinue Januvia and place pt on scheduled meal coverage w/ 5 units of novolog w/ meals. Plan to reassess blood sugars in 1 month.  His updated medication list for this problem includes:    Bayer Childrens Aspirin 81 Mg Chew (Aspirin) .Marland Kitchen... Take 1 tablet by mouth once a day    Glucophage 1000 Mg Tabs (Metformin hcl) .Marland Kitchen... Take 1 tablet by mouth twice a day    Januvia 100 Mg Tabs (Sitagliptin  phosphate) .Marland Kitchen... Take 1 tablet by mouth once a day    Lisinopril-hydrochlorothiazide 20-25 Mg Tabs (Lisinopril-hydrochlorothiazide) .Marland Kitchen... Take 1 tablet by mouth once a day    Lantus 100 Unit/ml Soln (Insulin glargine) .Marland KitchenMarland KitchenMarland KitchenMarland Kitchen 50 units daily    Novolog 100 Unit/ml Soln (Insulin aspart) ..... Inject sliding scale three times a day with meals as directed. 30 units max daily. dispense 1 month supply  Complete Medication List: 1)  Amitriptyline Hcl 25 Mg Tabs (Amitriptyline hcl) .... Take 3 tablet by mouth every night 2)  Bayer Childrens Aspirin 81 Mg Chew (Aspirin) .... Take 1 tablet by mouth once a day 3)  Cilostazol 100 Mg Tabs (Cilostazol) .... Take 1 tablet by mouth twice a day 4)  Crestor 40 Mg Tabs (Rosuvastatin calcium) .... Take 1 tablet by mouth once a day 5)  Glucophage 1000 Mg Tabs (Metformin hcl) .... Take 1 tablet by mouth twice a day 6)  Januvia 100 Mg Tabs (Sitagliptin phosphate) .... Take 1 tablet by mouth once a day 7)  Lisinopril-hydrochlorothiazide 20-25 Mg Tabs (Lisinopril-hydrochlorothiazide) .... Take 1 tablet by mouth once a day 8)  Nexium 40 Mg Cpdr (Esomeprazole magnesium) .... Take 1 capsule by mouth once a day 9)  Lantus 100 Unit/ml Soln (Insulin glargine) .Marland KitchenMarland KitchenMarland Kitchen  50 units daily 10)  Metoprolol Tartrate 100 Mg Tabs (Metoprolol tartrate) .Marland Kitchen.. 1 tablet by mouth bid 11)  Novolog 100 Unit/ml Soln (Insulin aspart) .... Inject sliding scale three times a day with meals as directed. 30 units max daily. dispense 1 month supply  Other Orders: Basic Met-[facility] [id number])    Prevention & Chronic Care Immunizations   Influenza vaccine: given  (08/29/2008)   Influenza vaccine due: 08/29/2009    Tetanus booster: 11/01/2002: Done.   Tetanus booster due: 11/01/2012    Pneumococcal vaccine: Done.  (07/02/2002)   Pneumococcal vaccine due: None    H. zoster vaccine: Not documented  Colorectal Screening   Hemoccult: Done.  (07/02/2001)   Hemoccult due: Not Indicated     Colonoscopy: Done.  (08/01/2002)   Colonoscopy due: 08/01/2012  Other Screening   PSA: followed by urology  (12/30/2008)   PSA due due: Not Indicated  Reports requested:  Smoking status: quit  (12/30/2009)  Diabetes Mellitus   HgbA1C: 7.6  (12/30/2009)   Hemoglobin A1C due: 07/26/2008    Eye exam: Not documented   Last eye exam report requested.   Diabetic eye exam action/deferral: Ophthalmology referral  (12/30/2009)    Foot exam: yes  (12/30/2009)   High risk foot: Not documented   Foot care education: Not documented   Foot exam due: 04/25/2009    Urine microalbumin/creatinine ratio: Not documented   Urine microalbumin/cr due: 09/17/2008    Diabetes flowsheet reviewed?: Yes   Progress toward A1C goal: Unchanged  Lipids   Total Cholesterol: 162  (09/08/2009)   LDL: 81  (09/08/2009)   LDL Direct: 101  (08/29/2008)   HDL: 30  (09/08/2009)   Triglycerides: 255  (09/08/2009)    SGOT (AST): 16  (09/08/2009)   SGPT (ALT): 15  (09/08/2009)   Alkaline phosphatase: 69  (09/08/2009)   Total bilirubin: 0.6  (09/08/2009)    Lipid flowsheet reviewed?: Yes   Progress toward LDL goal: Unchanged  Hypertension   Last Blood Pressure: 145 / 77  (02/10/2010)   Serum creatinine: 1.28  (09/08/2009)   Serum potassium 4.4  (09/08/2009)    Hypertension flowsheet reviewed?: Yes   Progress toward BP goal: Unchanged  Self-Management Support :    Diabetes self-management support: Written self-care plan, Referred for medical nutrition therapy  (12/30/2009)    Hypertension self-management support: Written self-care plan, Education handout  (02/10/2010)   Hypertension self-care plan printed.   Hypertension education handout printed    Lipid self-management support: Written self-care plan, Referred for medical nutrition therapy  (12/30/2009)    Nursing Instructions: Request report of last diabetic eye exam   Appended Document: Addendum for billing     Allergies: 1)   Nitroglycerin   Complete Medication List: 1)  Amitriptyline Hcl 25 Mg Tabs (Amitriptyline hcl) .... Take 3 tablet by mouth every night 2)  Bayer Childrens Aspirin 81 Mg Chew (Aspirin) .... Take 1 tablet by mouth once a day 3)  Cilostazol 100 Mg Tabs (Cilostazol) .... Take 1 tablet by mouth twice a day 4)  Crestor 40 Mg Tabs (Rosuvastatin calcium) .... Take 1 tablet by mouth once a day 5)  Glucophage 1000 Mg Tabs (Metformin hcl) .... Take 1 tablet by mouth twice a day 6)  Januvia 100 Mg Tabs (Sitagliptin phosphate) .... Take 1 tablet by mouth once a day 7)  Lisinopril-hydrochlorothiazide 20-25 Mg Tabs (Lisinopril-hydrochlorothiazide) .... Take 1 tablet by mouth once a day 8)  Nexium 40 Mg Cpdr (Esomeprazole magnesium) .... Take 1 capsule by  mouth once a day 9)  Lantus 100 Unit/ml Soln (Insulin glargine) .... 50 units daily 10)  Metoprolol Tartrate 100 Mg Tabs (Metoprolol tartrate) .Marland Kitchen.. 1 tablet by mouth bid 11)  Novolog 100 Unit/ml Soln (Insulin aspart) .... Inject sliding scale three times a day with meals as directed. 30 units max daily. dispense 1 month supply  Other Orders: [place]- Est  Level 4 [id number])  Appended Document: f/u bmc     Allergies: 1)  Nitroglycerin   Complete Medication List: 1)  Amitriptyline Hcl 25 Mg Tabs (Amitriptyline hcl) .... Take 3 tablet by mouth every night 2)  Bayer Childrens Aspirin 81 Mg Chew (Aspirin) .... Take 1 tablet by mouth once a day 3)  Cilostazol 100 Mg Tabs (Cilostazol) .... Take 1 tablet by mouth twice a day 4)  Crestor 40 Mg Tabs (Rosuvastatin calcium) .... Take 1 tablet by mouth once a day 5)  Glucophage 1000 Mg Tabs (Metformin hcl) .... Take 1 tablet by mouth twice a day 6)  Januvia 100 Mg Tabs (Sitagliptin phosphate) .... Take 1 tablet by mouth once a day 7)  Lisinopril-hydrochlorothiazide 20-25 Mg Tabs (Lisinopril-hydrochlorothiazide) .... Take 1 tablet by mouth once a day 8)  Nexium 40 Mg Cpdr (Esomeprazole magnesium) .... Take 1 capsule  by mouth once a day 9)  Lantus 100 Unit/ml Soln (Insulin glargine) .... 50 units daily 10)  Metoprolol Tartrate 100 Mg Tabs (Metoprolol tartrate) .Marland Kitchen.. 1 tablet by mouth bid 11)  Novolog 100 Unit/ml Soln (Insulin aspart) .... Inject 5 units daily  15-26min before meals. dispense 1 month supply Prescriptions: NOVOLOG 100 UNIT/ML SOLN (INSULIN ASPART) inject 5 units daily  15-36min before meals. Dispense 1 month supply  #0 x 0   Entered and Authorized by:   Shanda Howells MD   Signed by:   Shanda Howells MD on 02/13/2010   Method used:   Electronically to        [facility] (retail)       [address], [place]  [id number]       Ph: [phone]       Fax: [phone]   RxID:   [id number] [facility] [facility] ASPIRIN 81 MG CHEW (ASPIRIN) Take 1 tablet by mouth once a day  #31 x 12   Entered and Authorized by:   Shanda Howells MD   Signed by:   Shanda Howells MD on 02/13/2010   Method used:   Electronically to        [facility] (retail)       [address] [place] [address], [place]  [id number]       Ph: [phone]       Fax: [phone]   RxID:   [id number] AMITRIPTYLINE HCL 25 MG TABS (AMITRIPTYLINE HCL) Take 3 tablet by mouth every night  #90 x 2   Entered and Authorized by:   Shanda Howells MD   Signed by:   Shanda Howells MD on 02/13/2010   Method used:   Electronically to        [facility] (retail)       [address]       [place], [place]  [id number]       Ph: [phone]       Fax: [phone]   RxID:   [id number]  the above Rx for novolog insulin was not received by pharmacy. MD had entered # 0 and  0 refills but to dispense one month supply . RN gave him 1 vial  and 3 additional refills. Marcell Barlow RN  February 13, 2010 3:42 PM

## 2010-12-01 NOTE — Consult Note (Signed)
[facility]: [facility]   Imported By: Audie Clear 06/10/2010 14:04:50  _____________________________________________________________________  External Attachment:    Type:   Image     Comment:   External Document  Appended Document: [facility] PSA trending down per report. persistent nocturia and ED.  Shanda Howells MD {DATETIMESTAMP()}

## 2010-12-01 NOTE — Miscellaneous (Signed)
Summary: CPAP  Clinical Lists Changes spoke with pt, notified him that he needed to sched apt with his PCP to discuss his CPAP machine before Aug 2nd to be in complaince with medicare.  Pt stated he would check with his wife and have her call back to sched apt when she was avail to be at the apt will him.Isabelle Course  May 14, 2010 2:32 PM

## 2010-12-01 NOTE — Progress Notes (Signed)
Summary: refill  Phone Note Refill Request Call back at Home Phone [phone] Message from:  Patient  Refills Requested: Medication #1:  LANTUS 100 UNIT/ML  SOLN 50 units daily needs asap  Initial call taken by: Audie Clear,  June 11, 2010 9:51 AM  Follow-up for Phone Call       Follow-up by: Elige Radon RN,  June 11, 2010 9:57 AM    Prescriptions: LANTUS 100 UNIT/ML  SOLN (INSULIN GLARGINE) 50 units daily  #2 x 3   Entered by:   Elige Radon RN   Authorized by:   Shanda Howells MD   Signed by:   Elige Radon RN on 06/11/2010   Method used:   Electronically to        [facility] (retail)       [address], [place]  [id number]       Ph: [phone]       Fax: [phone]   RxID:   [id number]

## 2010-12-01 NOTE — Assessment & Plan Note (Signed)
Vital Signs:  Patient profile:   65 year old male Height:      73 inches Weight:      293.7 pounds BMI:     38.89 Pulse rate:   70 / minute BP sitting:   130 / 70  (left arm) Cuff size:   large  Vitals Entered By: Mauricia Area CMA, (Mar 19, 2010 1:42 PM) CC: f/up sleep study. need rx for diabetic shoes. Is Patient Diabetic? Yes Pain Assessment Patient in pain? no        Primary Care Provider:  Shanda Howells MD  CC:  f/up sleep study. need rx for diabetic shoes..  History of Present Illness: 65 YOM w/ PMHx/o HTN, DM, OSA, h/o MI here for followup visit OSA: Pt reports significant improvement in daytime sleepiness and fatigue s/p starting of CPAP. Pt w/ CPAP use for 3-4 weeks. Tolerating well per pt.  DM: Pt reports fasting blood sugars in 100s-120s per pt w/ post prandial blood sugars in 130s-140s. Tolerating three times a day meal coverage well. No HA, vision changes, SOB, CP, LE edema, polyuria, polydypsia.  Obesity: 6 lb weight loss since 09/2009. Pt actively walking 3-4 times per week; actively trying to eat a low carb diet.   Habits & Providers  Alcohol-Tobacco-Diet     Tobacco Status: quit  Allergies: 1)  Nitroglycerin  Review of Systems       See HPI  Physical Exam  General:  alert and overweight-appearing.   Head:  normocephalic and atraumatic.   Eyes:  vision grossly intact.   Ears:  R ear normal and L ear normal.   Nose:  no external deformity.   Mouth:  good dentition.   Neck:  supple, full ROM  Lungs:  CTAB Heart:  RRR Abdomen:  obese, NT   Impression & Recommendations:  Problem # 1:  OBSTRUCTIVE SLEEP APNEA (ICD-327.23) Marked improvement in fatigue and hypersomnoence per pt. Pt encouraged to continue CPAP therapy as pt is tolerating well.   Problem # 2:  DIABETES MELLITUS II, UNCOMPLICATED (XX123456) Plan to continue regimen as blood sugars much improved w/ three times a day coverage. Will obtain BMET to assess renal function. HbA1C in  1 month. [facility] flowsheet reviewed.  His updated medication list for this problem includes:    Bayer Childrens Aspirin 81 Mg Chew (Aspirin) .Marland Kitchen... Take 1 tablet by mouth once a day    Glucophage 1000 Mg Tabs (Metformin hcl) .Marland Kitchen... Take 1 tablet by mouth twice a day    Januvia 100 Mg Tabs (Sitagliptin phosphate) .Marland Kitchen... Take 1 tablet by mouth once a day    Lisinopril-hydrochlorothiazide 20-25 Mg Tabs (Lisinopril-hydrochlorothiazide) .Marland Kitchen... Take 1 tablet by mouth once a day    Lantus 100 Unit/ml Soln (Insulin glargine) .Marland KitchenMarland KitchenMarland KitchenMarland Kitchen 50 units daily    Novolog 100 Unit/ml Soln (Insulin aspart) ..... Inject 5 units daily  15-13min before meals. dispense 1 month supply  Problem # 3:  OBESITY (ICD-278.00) Pt encouraged to continue exercise regimen for risk foactor modification. Pt agreeable.  Complete Medication List: 1)  Amitriptyline Hcl 25 Mg Tabs (Amitriptyline hcl) .... Take 3 tablet by mouth every night 2)  Bayer Childrens Aspirin 81 Mg Chew (Aspirin) .... Take 1 tablet by mouth once a day 3)  Cilostazol 100 Mg Tabs (Cilostazol) .... Take 1 tablet by mouth twice a day 4)  Crestor 40 Mg Tabs (Rosuvastatin calcium) .... Take 1 tablet by mouth once a day 5)  Glucophage 1000 Mg Tabs (Metformin hcl) .Marland KitchenMarland KitchenMarland Kitchen  Take 1 tablet by mouth twice a day 6)  Januvia 100 Mg Tabs (Sitagliptin phosphate) .... Take 1 tablet by mouth once a day 7)  Lisinopril-hydrochlorothiazide 20-25 Mg Tabs (Lisinopril-hydrochlorothiazide) .... Take 1 tablet by mouth once a day 8)  Nexium 40 Mg Cpdr (Esomeprazole magnesium) .... Take 1 capsule by mouth once a day 9)  Lantus 100 Unit/ml Soln (Insulin glargine) .... 50 units daily 10)  Metoprolol Tartrate 100 Mg Tabs (Metoprolol tartrate) .Marland Kitchen.. 1 tablet by mouth bid 11)  Novolog 100 Unit/ml Soln (Insulin aspart) .... Inject 5 units daily  15-40min before meals. dispense 1 month supply  Other Orders: Basic Met[facility] [phone]) [facility] Microalbumin[facility] [id number]) [place]- Est Level  3 [id number])   Prevention &  Chronic Care Immunizations   Influenza vaccine: given  (08/29/2008)   Influenza vaccine due: 08/29/2009    Tetanus booster: 11/01/2002: Done.   Tetanus booster due: 11/01/2012    Pneumococcal vaccine: Done.  (07/02/2002)   Pneumococcal vaccine due: None    H. zoster vaccine: Not documented  Colorectal Screening   Hemoccult: Done.  (07/02/2001)   Hemoccult due: Not Indicated    Colonoscopy: Done.  (08/01/2002)   Colonoscopy due: 08/01/2012  Other Screening   PSA: followed by urology  (12/30/2008)   PSA due due: Not Indicated   Smoking status: quit  (03/19/2010)  Diabetes Mellitus   HgbA1C: 7.6  (12/30/2009)   Hemoglobin A1C due: 07/26/2008    Eye exam: Not documented   Diabetic eye exam action/deferral: Ophthalmology referral  (12/30/2009)    Foot exam: yes  (12/30/2009)   High risk foot: Not documented   Foot care education: Not documented   Foot exam due: 04/25/2009    Urine microalbumin/creatinine ratio: Not documented   Urine microalbumin action/deferral: Ordered   Urine microalbumin/cr due: 09/17/2008    Diabetes flowsheet reviewed?: Yes   Progress toward A1C goal: At goal  Lipids   Total Cholesterol: 162  (09/08/2009)   LDL: 81  (09/08/2009)   LDL Direct: 101  (08/29/2008)   HDL: 30  (09/08/2009)   Triglycerides: 255  (09/08/2009)    SGOT (AST): 16  (09/08/2009)   SGPT (ALT): 15  (09/08/2009)   Alkaline phosphatase: 69  (09/08/2009)   Total bilirubin: 0.6  (09/08/2009)    Lipid flowsheet reviewed?: Yes   Progress toward LDL goal: Unchanged  Hypertension   Last Blood Pressure: 130 / 70  (03/19/2010)   Serum creatinine: 1.66  (02/10/2010)   Serum potassium 4.1  (02/10/2010)  Self-Management Support :    Diabetes self-management support: Written self-care plan, Referred for medical nutrition therapy  (12/30/2009)    Hypertension self-management support: Written self-care plan, Education handout  (02/10/2010)    Lipid self-management support:  Written self-care plan, Referred for medical nutrition therapy  (12/30/2009)

## 2010-12-01 NOTE — Assessment & Plan Note (Signed)
Summary: f/up,tcb   Vital Signs:  Patient profile:   65 year old male Height:      73 inches Weight:      296 pounds BMI:     39.19 Temp:     98.0 degrees F oral Pulse rate:   70 / minute BP sitting:   123 / 73  Vitals Entered By: Schuyler Amor CMA (December 30, 2009 10:03 AM) CC: F/U diabetes Is Patient Diabetic? Yes Pain Assessment Patient in pain? no        Primary Care Provider:  Shanda Howells MD  CC:  F/U diabetes.  History of Present Illness: 65 YO male w/ PMHx/o DM, obesity, peripheral vascular disease here for diabetic followup. Pt denoes any headache, vision changes, CP, nausea, vomitng, diarrhea, constipation, or dysuria since last visit. Pt reports that blood sugars have been "normal" since the last visit.Pt states that he has also tried diet and lifestyle changes since last visit including walking and home exercise on a daily basis Pt does also report having chronic fatigue over last 1 year. Pt does report to daytime somnolence. Per spouse, pt w/ longstanding hx/o of snoring and episodes of "stopping breathing" while sleeping. Pt denies any previous hx/o sleep apnea, or evaluation for snooring.   Habits & Providers  Alcohol-Tobacco-Diet     Tobacco Status: quit  Allergies: 1)  Nitroglycerin  Physical Exam  General:  alert and overweight-appearing.   Head:  normocephalic and atraumatic Eyes:  vision grossly intact, pupils equal, pupils round, and pupils reactive to light.   Ears:  R ear normal and L ear normal.   Nose:  no external deformity.   Mouth:  good dentition.   Neck:  supple and full ROM, minimal neck girth Lungs:  no intercostal retractions.   Heart:  normal rate, regular rhythm, and no murmur.   Abdomen:  obese abdomen, + bowel sounds, non tender Msk:  normal ROM and no joint tenderness.    Diabetes Management Exam:    Foot Exam (with socks and/or shoes not present):       Sensory-Pinprick/Light touch:          Left medial foot (L-4): normal          Left dorsal foot (L-5): normal          Left lateral foot (S-1): normal          Right medial foot (L-4): normal          Right dorsal foot (L-5): normal          Right lateral foot (S-1): normal       Sensory-Monofilament:          Left foot: normal          Right foot: normal       Inspection:          Left foot: normal          Right foot: normal       Nails:          Left foot: normal          Right foot: normal   Impression & Recommendations:  Problem # 1:  DIABETES MELLITUS II, UNCOMPLICATED ([id number]) Pt currently very close to goal w/ HbA1C with improving A1Cs over last 2-3 clinical visits. Pt encouraged to continue diet and lifestyle changes. Pt given referral for nutrition counseling. Pt agreeable to referral.  The following medications were removed from the medication list:  Glucotrol Xl 10 Mg Tb24 (Glipizide) .Marland Kitchen... 2 tablets by mouth every morning His updated medication list for this problem includes:    Bayer Childrens Aspirin 81 Mg Chew (Aspirin) .Marland Kitchen... Take 1 tablet by mouth once a day    Glucophage 1000 Mg Tabs (Metformin hcl) .Marland Kitchen... Take 1 tablet by mouth twice a day    Januvia 100 Mg Tabs (Sitagliptin phosphate) .Marland Kitchen... Take 1 tablet by mouth once a day    Lisinopril-hydrochlorothiazide 20-25 Mg Tabs (Lisinopril-hydrochlorothiazide) .Marland Kitchen... Take 1 tablet by mouth once a day    Lantus 100 Unit/ml Soln (Insulin glargine) .Marland KitchenMarland KitchenMarland KitchenMarland Kitchen 50 units daily    Novolog 100 Unit/ml Soln (Insulin aspart) ..... Inject sliding scale three times a day with meals as directed. 30 units max daily. dispense 1 month supply  Orders: A1C-[facility] [id number]) [address]- Est Level  5 [id number])  Problem # 2:  FATIGUE, CHRONIC (ICD-780.79) In setting of large neck girth, hx/o snoring and observed apnic spells by spouse, pt may have wroking dx of OSA that will need further evaluation. Thyroid studies are also ordered to rule hypothyroidism as source of fatigue.  Orders: TSH-[facility] [id number]) Free T3-[facility]  [id number]) Free T4-[facility] ([phone]) [facility]- Est Level  5 [id number])  Complete Medication List: 1)  Amitriptyline Hcl 25 Mg Tabs (Amitriptyline hcl) .... Take 3 tablet by mouth every night 2)  Bayer Childrens Aspirin 81 Mg Chew (Aspirin) .... Take 1 tablet by mouth once a day 3)  Cilostazol 100 Mg Tabs (Cilostazol) .... Take 1 tablet by mouth twice a day 4)  Crestor 40 Mg Tabs (Rosuvastatin calcium) .... Take 1 tablet by mouth once a day 5)  Glucophage 1000 Mg Tabs (Metformin hcl) .... Take 1 tablet by mouth twice a day 6)  Januvia 100 Mg Tabs (Sitagliptin phosphate) .... Take 1 tablet by mouth once a day 7)  Lisinopril-hydrochlorothiazide 20-25 Mg Tabs (Lisinopril-hydrochlorothiazide) .... Take 1 tablet by mouth once a day 8)  Nexium 40 Mg Cpdr (Esomeprazole magnesium) .... Take 1 capsule by mouth once a day 9)  Lantus 100 Unit/ml Soln (Insulin glargine) .... 50 units daily 10)  Metoprolol Tartrate 100 Mg Tabs (Metoprolol tartrate) .Marland Kitchen.. 1 tablet by mouth bid 11)  Novolog 100 Unit/ml Soln (Insulin aspart) .... Inject sliding scale three times a day with meals as directed. 30 units max daily. dispense 1 month supply  Other Orders: Split Night (Split Night) Urinalysis-[facility] (00000) Nutrition Referral (Nutrition) Prescriptions: LANTUS 100 UNIT/ML  SOLN (INSULIN GLARGINE) 50 units daily  #32mL x 3   Entered and Authorized by:   Shanda Howells MD   Signed by:   Shanda Howells MD on 12/30/2009   Method used:   Electronically to        [facility] (retail)       [address] [place], [place]  [id number]       Ph: [phone]       Fax: [phone]   RxID:   [id number]   Laboratory Results   Urine Tests  Date/Time Received: December 30, 2009 11:53 AM  Date/Time Reported: December 30, 2009 12:11 PM   Routine Urinalysis   Color: yellow Appearance: Clear Glucose: negative   (Normal Range: Negative) Bilirubin: negative   (Normal Range: Negative) Ketone:  negative   (Normal Range: Negative) Spec. Gravity: 1.020   (Normal Range: 1.003-1.035) Blood: negative   (Normal Range: Negative) pH: 5.5   (Normal Range: 5.0-8.0) Protein: negative   (Normal Range: Negative)  Urobilinogen: 0.2   (Normal Range: 0-1) Nitrite: negative   (Normal Range: Negative) Leukocyte Esterace: negative   (Normal Range: Negative)    Comments: ...........test performed by...........Marland KitchenHedy Camara, CMA   Blood Tests   Date/Time Received: December 30, 2009 10:03 AM  Date/Time Reported: December 30, 2009 10:20 AM   HGBA1C: 7.6%   (Normal Range: Non-Diabetic - 3-6%   Control Diabetic - 6-8%)  Comments: ...........test performed by...........Marland KitchenHedy Camara, CMA      Prevention & Chronic Care Immunizations   Influenza vaccine: given  (08/29/2008)   Influenza vaccine due: 08/29/2009    Tetanus booster: 11/01/2002: Done.   Tetanus booster due: 11/01/2012    Pneumococcal vaccine: Done.  (07/02/2002)   Pneumococcal vaccine due: None    H. zoster vaccine: Not documented  Colorectal Screening   Hemoccult: Done.  (07/02/2001)   Hemoccult due: Not Indicated    Colonoscopy: Done.  (08/01/2002)   Colonoscopy due: 08/01/2012  Other Screening   PSA: followed by urology  (12/30/2008)   PSA due due: Not Indicated   Smoking status: quit  (12/30/2009)  Diabetes Mellitus   HgbA1C: 7.6  (12/30/2009)   Hemoglobin A1C due: 07/26/2008    Eye exam: Not documented   Diabetic eye exam action/deferral: Ophthalmology referral  (12/30/2009)    Foot exam: yes  (12/30/2009)   High risk foot: Not documented   Foot care education: Not documented   Foot exam due: 04/25/2009    Urine microalbumin/creatinine ratio: Not documented   Urine microalbumin/cr due: 09/17/2008    Diabetes flowsheet reviewed?: Yes   Progress toward A1C goal: Unchanged  Lipids   Total Cholesterol: 162  (09/08/2009)   LDL: 81  (09/08/2009)   LDL Direct: 101  (08/29/2008)   HDL: 30  (09/08/2009)    Triglycerides: 255  (09/08/2009)    SGOT (AST): 16  (09/08/2009)   SGPT (ALT): 15  (09/08/2009)   Alkaline phosphatase: 69  (09/08/2009)   Total bilirubin: 0.6  (09/08/2009)    Lipid flowsheet reviewed?: Yes   Progress toward LDL goal: Unchanged  Hypertension   Last Blood Pressure: 123 / 73  (12/30/2009)   Serum creatinine: 1.28  (09/08/2009)   Serum potassium 4.4  (09/08/2009)    Hypertension flowsheet reviewed?: Yes   Progress toward BP goal: Unchanged  Self-Management Support :    Diabetes self-management support: Written self-care plan, Referred for medical nutrition therapy  (12/30/2009)   Diabetes care plan printed   Referred.    Hypertension self-management support: Written self-care plan, Referred for medical nutrition therapy  (12/30/2009)   Hypertension self-care plan printed.    Lipid self-management support: Written self-care plan, Referred for medical nutrition therapy  (12/30/2009)   Lipid self-care plan printed.   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

## 2010-12-01 NOTE — Consult Note (Signed)
Summary: [facility]   Imported By: Audie Clear 12/05/2009 17:03:09  _____________________________________________________________________  External Attachment:    Type:   Image     Comment:   External Document

## 2010-12-01 NOTE — Consult Note (Signed)
Summary: [facility]  [facility]   Imported By: Raymond Gurney 08/18/2010 16:42:44  _____________________________________________________________________  External Attachment:    Type:   Image     Comment:   External Document

## 2010-12-01 NOTE — Assessment & Plan Note (Signed)
Summary: Chronic Problem Followup   Vital Signs:  Patient profile:   65 year old male Weight:      298 pounds Pulse rate:   75 / minute BP sitting:   130 / 75  (left arm) Cuff size:   large  Vitals Entered By: Mauricia Area CMA, (May 29, 2010 1:33 PM) CC: f/up sleep study. Is Patient Diabetic? Yes Pain Assessment Patient in pain? no        Primary Care Provider:  Shanda Howells MD  CC:  f/up sleep study..  History of Present Illness:  65 YOM w/ PMHx/o HTN DM, OSA here for followup visit:  OSA: Tolerating CPAP very well. Feels that it has helped dramatically w/ fatigue and overall energy.  BPs- good at home per report. BPs in [id number] systolic. Asymptomatic. No CP, SOB, HA, vision changes. Diet: Eating a low salt diet. exercising less because of heat, but still active in evenings DM: CBGs 170s at home. Eating alot of sweet fruits per pt (watermelon, cantelope). No polyuria, polydypsia, hyperhagia.   Prostate Ca: Went for PSA today; was 3 last checked 3 months ago. No concerns from urology per pt. Plans to go back 06/08/2010.   Habits & Providers  Alcohol-Tobacco-Diet     Tobacco Status: quit > 6 months     Tobacco Counseling: not to resume use of tobacco products  Allergies: 1)  Nitroglycerin  Social History: Smoking Status:  quit > 6 months  Physical Exam  General:  alert and overweight-appearing.   Head:  normocephalic and atraumatic.   Eyes:  vision grossly intact.   Ears:  R ear normal and L ear normal.   Nose:  no external deformity.   Mouth:  good dentition.   Neck:  large neck girth; supple and full ROM.   Lungs:  CTAB, no wheezes, rales, rhoncii Heart:  RRR, no rubs, gallops, murmurs Abdomen:  obese, NT/ND  Diabetes Management Exam:    Foot Exam (with socks and/or shoes not present):       Sensory-Pinprick/Light touch:          Left medial foot (L-4): normal          Left dorsal foot (L-5): normal          Left lateral foot (S-1): normal  Right medial foot (L-4): normal          Right dorsal foot (L-5): normal          Right lateral foot (S-1): normal       Sensory-Monofilament:          Left foot: normal          Right foot: normal       Inspection:          Left foot: normal          Right foot: normal       Nails:          Left foot: normal          Right foot: normal   Impression & Recommendations:  Problem # 1:  OBSTRUCTIVE SLEEP APNEA (ICD-327.23) Tolerating CPAP well w/ symptomatology much improved per pt. Encouraged to keep using.   Problem # 2:  HYPERTENSION, BENIGN SYSTEMIC (ICD-401.1) BPs well controlled thus far. Will continue current regimen. Will obtain BMET to assess renal function. Would consider transitioning off ACEi if renal function progressively worse. His updated medication list for this problem includes:    Lisinopril-hydrochlorothiazide 20-25 Mg Tabs (Lisinopril-hydrochlorothiazide) .Marland KitchenMarland KitchenMarland KitchenMarland Kitchen  Take 1 tablet by mouth once a day    Metoprolol Tartrate 100 Mg Tabs (Metoprolol tartrate) .Marland Kitchen... 1 tablet by mouth bid  Orders: [place]- Est Level  3 [id number])  Problem # 3:  DIABETES MELLITUS II, UNCOMPLICATED ([id number]) Plan to obtain HbA1C to assess blood glucose control. Will consider increasing lantus if HbA1C increased. Encuraged to eat high vegetable vs. fruit diet as well as to resume previous exercise regimen at sundown to avoid high daytime temps. Pt agreeable.  His updated medication list for this problem includes:    Bayer Childrens Aspirin 81 Mg Chew (Aspirin) .Marland Kitchen... Take 1 tablet by mouth once a day    Glucophage 1000 Mg Tabs (Metformin hcl) .Marland Kitchen... Take 1 tablet by mouth twice a day    Januvia 100 Mg Tabs (Sitagliptin phosphate) .Marland Kitchen... Take 1 tablet by mouth once a day    Lisinopril-hydrochlorothiazide 20-25 Mg Tabs (Lisinopril-hydrochlorothiazide) .Marland Kitchen... Take 1 tablet by mouth once a day    Lantus 100 Unit/ml Soln (Insulin glargine) .Marland KitchenMarland KitchenMarland KitchenMarland Kitchen 50 units daily    Novolog 100 Unit/ml Soln (Insulin aspart)  ..... Inject 5 units daily  15-26min before meals. dispense 1 month supply  Orders: A1C-[facility] [id number]) UA Microalbumin-[facility] [id number]) [place]- Est Level  3 [id number])  Problem # 4:  PROSTATE CANCER (ICD-185) Clinically stable(baseline nocturia) w/ incremental rise in PSA from 12/2009 urology report.   Pending PSA level and urology re-evaluation 06/08/10. Otherwise stable. Will followup on urology recommendations.   Complete Medication List: 1)  Amitriptyline Hcl 25 Mg Tabs (Amitriptyline hcl) .... Take 3 tablet by mouth every night 2)  Bayer Childrens Aspirin 81 Mg Chew (Aspirin) .... Take 1 tablet by mouth once a day 3)  Cilostazol 100 Mg Tabs (Cilostazol) .... Take 1 tablet by mouth twice a day 4)  Crestor 40 Mg Tabs (Rosuvastatin calcium) .... Take 1 tablet by mouth once a day 5)  Glucophage 1000 Mg Tabs (Metformin hcl) .... Take 1 tablet by mouth twice a day 6)  Januvia 100 Mg Tabs (Sitagliptin phosphate) .... Take 1 tablet by mouth once a day 7)  Lisinopril-hydrochlorothiazide 20-25 Mg Tabs (Lisinopril-hydrochlorothiazide) .... Take 1 tablet by mouth once a day 8)  Nexium 40 Mg Cpdr (Esomeprazole magnesium) .... Take 1 capsule by mouth once a day 9)  Lantus 100 Unit/ml Soln (Insulin glargine) .... 50 units daily 10)  Metoprolol Tartrate 100 Mg Tabs (Metoprolol tartrate) .Marland Kitchen.. 1 tablet by mouth bid 11)  Novolog 100 Unit/ml Soln (Insulin aspart) .... Inject 5 units daily  15-16min before meals. dispense 1 month supply  Other Orders: Basic Met-[facility] [phone])   Prevention & Chronic Care Immunizations   Influenza vaccine: given  (08/29/2008)   Influenza vaccine due: 08/29/2009    Tetanus booster: 11/01/2002: Done.   Tetanus booster due: 11/01/2012    Pneumococcal vaccine: Done.  (07/02/2002)   Pneumococcal vaccine due: None    H. zoster vaccine: Not documented  Colorectal Screening   Hemoccult: Done.  (07/02/2001)   Hemoccult due: Not Indicated    Colonoscopy: Done.  (08/01/2002)    Colonoscopy due: 08/01/2012  Other Screening   PSA: followed by urology  (12/30/2008)   PSA due due: Not Indicated   Smoking status: quit > 6 months  (05/29/2010)  Diabetes Mellitus   HgbA1C: 8.9  (05/29/2010)   Hemoglobin A1C due: 07/26/2008    Eye exam: Not documented   Diabetic eye exam action/deferral: Ophthalmology referral  (12/30/2009)   Eye exam due: 08/28/2010    Foot exam: yes  (05/29/2010)  High risk foot: Not documented   Foot care education: Not documented   Foot exam due: 04/25/2009    Urine microalbumin/creatinine ratio: Not documented   Urine microalbumin action/deferral: Ordered   Urine microalbumin/cr due: 09/17/2008    Diabetes flowsheet reviewed?: Yes   Progress toward A1C goal: At goal  Lipids   Total Cholesterol: 162  (09/08/2009)   LDL: 81  (09/08/2009)   LDL Direct: 101  (08/29/2008)   HDL: 30  (09/08/2009)   Triglycerides: 255  (09/08/2009)    SGOT (AST): 16  (09/08/2009)   SGPT (ALT): 15  (09/08/2009)   Alkaline phosphatase: 69  (09/08/2009)   Total bilirubin: 0.6  (09/08/2009)    Lipid flowsheet reviewed?: Yes   Progress toward LDL goal: Improved  Hypertension   Last Blood Pressure: 130 / 75  (05/29/2010)   Serum creatinine: 1.56  (03/19/2010)   Serum potassium 4.3  (03/19/2010)    Hypertension flowsheet reviewed?: Yes   Progress toward BP goal: At goal  Self-Management Support :    Diabetes self-management support: Written self-care plan, Education handout  (05/29/2010)   Diabetes care plan printed   Diabetes education handout printed    Hypertension self-management support: Written self-care plan, Education handout  (05/29/2010)   Hypertension self-care plan printed.   Hypertension education handout printed    Lipid self-management support: Written self-care plan, Education handout  (05/29/2010)   Lipid self-care plan printed.   Lipid education handout printed   Laboratory Results   Urine Tests  Date/Time Received:  May 29, 2010 2:07 PM  Date/Time Reported: May 29, 2010 2:37 PM   Microalbumin (urine): 10 mg/L Creatinine: 200mg /dL  A:C Ratio <30 Normal Comments: ...........test performed by...........Marland KitchenHedy Camara, CMA   Blood Tests   Date/Time Received: May 29, 2010 2:07 PM  Date/Time Reported: May 29, 2010 2:37 PM   HGBA1C: 8.9%   (Normal Range: Non-Diabetic - 3-6%   Control Diabetic - 6-8%)  Comments: ...........test performed by...........Marland KitchenHedy Camara, CMA

## 2010-12-09 NOTE — Assessment & Plan Note (Signed)
Summary: F/U  Adam [name]   Vital Signs:  Patient profile:   65 year old male Height:      73 inches Weight:      296 pounds BP sitting:   142 / 82  (right arm) Cuff size:   regular  Vitals Entered By: Schuyler Amor CMA (December 01, 2010 9:12 AM)  Primary Care Provider:  Shanda Howells MD   History of Present Illness: Reports elevated blood sugars in the 200 fasting.  He would like to change his diet but blames his wife on her cooking.  He does not exercise moslty because his legs are weak, and painful.  Short visit as primary MD sick today.  Current Medications (verified): 1)  Bayer Childrens Aspirin 81 Mg Chew (Aspirin) .... Take 1 Tablet By Mouth Once A Day 2)  Cilostazol 100 Mg Tabs (Cilostazol) .... Take 1 Tablet By Mouth Twice A Day 3)  Crestor 40 Mg Tabs (Rosuvastatin Calcium) .... Take 1 Tablet By Mouth Once A Day 4)  Glucophage 1000 Mg Tabs (Metformin Hcl) .... Take 1 Tablet By Mouth Twice A Day 5)  Lisinopril-Hydrochlorothiazide 20-25 Mg Tabs (Lisinopril-Hydrochlorothiazide) .... Take 1 Tablet By Mouth Once A Day 6)  Nexium 40 Mg Cpdr (Esomeprazole Magnesium) .... Take 1 Capsule By Mouth Once A Day 7)  Lantus 100 Unit/ml  Soln (Insulin Glargine) .... 55 Units For One Week, and 60 After If Fastings Are Greater Than 150 8)  Metoprolol Tartrate 100 Mg  Tabs (Metoprolol Tartrate) .Marland Kitchen.. 1 Tablet By Mouth Bid 9)  Novolog 100 Unit/ml Soln (Insulin Aspart) .... Per Sliding Scale  Allergies (verified): 1)  Nitroglycerin  Physical Exam  General:  alert and well-developed.     Impression & Recommendations:  Problem # 1:  DIABETES MELLITUS II, UNCOMPLICATED ([id number]) Spent 15 minutes counseling, may increase Lantus to 55 units and if sugars are still high can increase to 60 units, refilled chronic meds,  The following medications were removed from the medication list:    Januvia 100 Mg Tabs (Sitagliptin phosphate) .Marland Kitchen... Take 1 tablet by mouth once a day His updated medication list  for this problem includes:    Bayer Childrens Aspirin 81 Mg Chew (Aspirin) .Marland Kitchen... Take 1 tablet by mouth once a day    Glucophage 1000 Mg Tabs (Metformin hcl) .Marland Kitchen... Take 1 tablet by mouth twice a day    Lisinopril-hydrochlorothiazide 20-25 Mg Tabs (Lisinopril-hydrochlorothiazide) .Marland Kitchen... Take 1 tablet by mouth once a day    Lantus 100 Unit/ml Soln (Insulin glargine) .Marland KitchenMarland KitchenMarland KitchenMarland Kitchen 55 units for one week, and 60 after if fastings are greater than 150    Novolog 100 Unit/ml Soln (Insulin aspart) .Marland Kitchen... Per sliding scale  Orders: A1C-[facility] [id number]) Comp Met-[facility] [id number]) Lipid-[facility] [id number]) [place]- Est Level  2 [id number])  Complete Medication List: 1)  Bayer Childrens Aspirin 81 Mg Chew (Aspirin) .... Take 1 tablet by mouth once a day 2)  Cilostazol 100 Mg Tabs (Cilostazol) .... Take 1 tablet by mouth twice a day 3)  Crestor 40 Mg Tabs (Rosuvastatin calcium) .... Take 1 tablet by mouth once a day 4)  Glucophage 1000 Mg Tabs (Metformin hcl) .... Take 1 tablet by mouth twice a day 5)  Lisinopril-hydrochlorothiazide 20-25 Mg Tabs (Lisinopril-hydrochlorothiazide) .... Take 1 tablet by mouth once a day 6)  Nexium 40 Mg Cpdr (Esomeprazole magnesium) .... Take 1 capsule by mouth once a day 7)  Lantus 100 Unit/ml Soln (Insulin glargine) .... 55 units for one week, and 60 after if fastings are greater  than 150 8)  Metoprolol Tartrate 100 Mg Tabs (Metoprolol tartrate) .Marland Kitchen.. 1 tablet by mouth bid 9)  Novolog 100 Unit/ml Soln (Insulin aspart) .... Per sliding scale  Patient Instructions: 1)  Please schedule a follow-up appointment in 1 month.  Prescriptions: LANTUS 100 UNIT/ML  SOLN (INSULIN GLARGINE) 55 units for one week, and 60 after if fastings are greater than 150  #3 x 6   Entered and Authorized by:   Tereasa Coop NP   Signed by:   Tereasa Coop NP on 12/01/2010   Method used:   Electronically to        [facility] (retail)       [address], [place]  [id number]        Ph: [phone]       Fax: [phone]   RxID:   [id number] NEXIUM 40 MG CPDR (ESOMEPRAZOLE MAGNESIUM) Take 1 capsule by mouth once a day  #30 x 6   Entered and Authorized by:   Tereasa Coop NP   Signed by:   Tereasa Coop NP on 12/01/2010   Method used:   Electronically to        [facility] (retail)       [address], [place]  [id number]       Ph: [phone]       Fax: [phone]   RxID:   [id number] LISINOPRIL-HYDROCHLOROTHIAZIDE 20-25 MG TABS (LISINOPRIL-HYDROCHLOROTHIAZIDE) Take 1 tablet by mouth once a day  #30 x 6   Entered and Authorized by:   Tereasa Coop NP   Signed by:   Tereasa Coop NP on 12/01/2010   Method used:   Electronically to        [facility] (retail)       [address], [place]  [id number]       Ph: [phone]       Fax: [phone]   RxID:   [id number] GLUCOPHAGE 1000 MG TABS (METFORMIN HCL) Take 1 tablet by mouth twice a day  #60 x 6   Entered and Authorized by:   Tereasa Coop NP   Signed by:   Tereasa Coop NP on 12/01/2010   Method used:   Electronically to        [facility] (retail)       [address], [place]  [id number]       Ph: [phone]       Fax: [phone]   RxID:   [id number] CRESTOR 40 MG TABS (ROSUVASTATIN CALCIUM) Take 1 tablet by mouth once a day  #30 x 6   Entered and Authorized by:   Tereasa Coop NP   Signed by:   Tereasa Coop NP on 12/01/2010   Method used:   Electronically to        [facility] (retail)       [address], [place]  [id number]       Ph: [phone]       Fax: [phone]   RxID:   [id number] CILOSTAZOL 100 MG TABS (CILOSTAZOL) Take 1 tablet by mouth twice a day  #60 x 3   Entered and Authorized by:   Tereasa Coop NP   Signed by:   Tereasa Coop NP on 12/01/2010  Method used:   Electronically to        [facility] (retail)       [address], [place]  [id number]       Ph: [phone]       Fax: [phone]   RxID:   [id number] NOVOLOG 100 UNIT/ML SOLN (INSULIN ASPART) per sliding scale  #3 x 11   Entered and Authorized by:   Tereasa Coop NP   Signed by:   Tereasa Coop NP on 12/01/2010   Method used:   Electronically to        [facility] (retail)       [address], [place]  [id number]       Ph: [phone]       Fax: [phone]   RxID:   [id number]    Orders Added: 1)  A1C-[facility] [83036] 2)  Comp Met-[facility] [id number] 3)  Lipid-[facility] [80061-22930] 4)  [place]- Est Level  2 [99212]  Appended Document: A1c  8.7 %    Lab Visit  Laboratory Results   Blood Tests   Date/Time Received: December 01, 2010 9:16 AM  Date/Time Reported: December 01, 2010 3:27 PM   HGBA1C: 8.7%   (Normal Range: Non-Diabetic - 3-6%   Control Diabetic - 6-8%)  Comments: ...............test performed by......Marland KitchenBonnie A. Martinique, MLS (ASCP)cm    Orders Today:

## 2010-12-15 ENCOUNTER — Other Ambulatory Visit: Payer: Self-pay | Admitting: Family Medicine

## 2010-12-15 NOTE — Telephone Encounter (Signed)
Refill request

## 2010-12-30 ENCOUNTER — Ambulatory Visit: Payer: Self-pay | Admitting: Family Medicine

## 2011-01-15 ENCOUNTER — Other Ambulatory Visit: Payer: Self-pay | Admitting: Family Medicine

## 2011-01-15 DIAGNOSIS — I10 Essential (primary) hypertension: Secondary | ICD-10-CM

## 2011-01-15 NOTE — Telephone Encounter (Signed)
Refill request

## 2011-02-24 ENCOUNTER — Ambulatory Visit (INDEPENDENT_AMBULATORY_CARE_PROVIDER_SITE_OTHER): Payer: Medicare Other | Admitting: Family Medicine

## 2011-02-24 ENCOUNTER — Encounter: Payer: Self-pay | Admitting: Family Medicine

## 2011-02-24 DIAGNOSIS — I10 Essential (primary) hypertension: Secondary | ICD-10-CM

## 2011-02-24 DIAGNOSIS — M109 Gout, unspecified: Secondary | ICD-10-CM

## 2011-02-24 DIAGNOSIS — E119 Type 2 diabetes mellitus without complications: Secondary | ICD-10-CM

## 2011-02-24 LAB — COMPREHENSIVE METABOLIC PANEL
BUN: 22 mg/dL (ref 6–23)
CO2: 25 mEq/L (ref 19–32)
Calcium: 9.8 mg/dL (ref 8.4–10.5)
Chloride: 101 mEq/L (ref 96–112)
Creat: 1.32 mg/dL (ref 0.40–1.50)
Glucose, Bld: 181 mg/dL — ABNORMAL HIGH (ref 70–99)

## 2011-02-24 LAB — POCT GLYCOSYLATED HEMOGLOBIN (HGB A1C): Hemoglobin A1C: 8.7

## 2011-02-24 MED ORDER — ALLOPURINOL 100 MG PO TABS: 100.0000 mg | ORAL_TABLET | Freq: Every day | ORAL | Status: DC

## 2011-02-24 MED ORDER — LOSARTAN POTASSIUM 50 MG PO TABS: 50.0000 mg | ORAL_TABLET | Freq: Every day | ORAL | Status: DC

## 2011-02-24 NOTE — Progress Notes (Signed)
  Subjective:    Patient ID: Adam [name], male    DOB: 01-18-1946, 65 y.o.   MRN: [id number]  HPI Pt here for chronic problem follow up: Last seen by Primary MD 05/2010: DM- Checking blood sugars daily. Has been intermittently compliant with low carb diet. Blood sugar ranging 160s-200s at home. No polyuria, polydypsia, polyphagia. Curretnly on Lantus 65 units daily, Metformin 2g daily, novolog 5 units tid ac.   HTN- Checking blood pressures at home. SBPs usually in 100s-110s per pt. Has had epsiodes of systolic pressures in the 90s per pt. Usually asymptomatic per pt. Has been compliant with medication regimen per pt   Gout- Has had 2-3 mild gout flares since last MD visit. Has used friend of family's indomethacin with minimal improvement in sxs. Gout flares usually in ankles/feet per pt. Most recent gout flare 2-3 weeks ago per pt.    Review of Systems See HPI     Objective:   Physical Exam Gen: up in chair, NAD, obese  CV: RRR, no murmurs auscultated PULM: CTAB, no wheezes, rales, rhoncii ABD: obese/NT/+ bowel sounds  EXT: 2+ peripheral pulses, trace edema    Assessment & Plan:  DM- Overall stable per report. Will adjust regimen pending A1C today. Most recent A1C @ 8.7 11/2010. Goal A1C <8; A1C <7 would be ideal.   HTN- Stable on current regimen. Will transition to cozaar from lisnopril/HCTZ in setting of concurrent gout. Wil follow up BPs in 1 month to reassess. Next step would be addition of norvasc. HR @ 60; likely at max dose of metoprolol.  Gout- Will check uric acid level today. Will start on allopurinol and cozaar. Will recheck uric acid level in 4-6 weeks.

## 2011-02-24 NOTE — Patient Instructions (Addendum)
STOP the lisinopril and hydrochlorothiazide START the cozaar and allopurinol for your gout.  Gout Gout is an inflammatory condition (arthritis) caused by a buildup of uric acid crystals in the joints. Uric acid is a chemical that is normally present in the blood. Under some circumstances, uric acid can form into crystals in your joints. This causes joint redness, soreness, and swelling (inflammation). Repeat attacks are common. Over time, uric acid crystals can form into masses (tophi) near a joint, causing disfigurement. Gout is treatable and often preventable. CAUSES The disease begins with elevated levels of uric acid in the blood. Uric acid is produced by your body when it breaks down a naturally found substance called purines. This also happens when you eat certain foods such as meats and fish. Causes of an elevated uric acid level include:  Being passed down from parent to child (heredity).   Diseases that cause increased uric acid production (obesity, psoriasis, some cancers).   Excessive alcohol use.   Diet, especially diets rich in meat and seafood.   Medicines, including certain cancer-fighting drugs (chemotherapy), diuretics, and aspirin.   Chronic kidney disease. The kidneys are no longer able to remove uric acid well.   Problems with metabolism.  Conditions strongly associated with gout include:  Obesity.   High blood pressure.   High cholesterol.   Diabetes.  Not everyone with elevated uric acid levels gets gout. It is not understood why some people get gout and others do not. Surgery, joint injury, and eating too much of certain foods are some of the factors that can lead to gout. SYMPTOMS  An attack of gout comes on quickly. It causes intense pain with redness, swelling, and warmth in a joint.   Fever can occur.   Often, only one joint is involved. Certain joints are more commonly involved:   Base of the big toe.   Knee.   Ankle.   Wrist.   Finger.    Without treatment, an attack usually goes away in a few days to weeks. Between attacks, you usually will not have symptoms, which is different from many other forms of arthritis. DIAGNOSIS Your caregiver will suspect gout based on your symptoms and exam. Removal of fluid from the joint (arthrocentesis) is done to check for uric acid crystals. Your caregiver will give you a medicine that numbs the area (local anesthetic) and use a needle to remove joint fluid for exam. Gout is confirmed when uric acid crystals are seen in joint fluid, using a special microscope. Sometimes, blood, urine, and X-ray tests are also used. TREATMENT There are 2 phases to gout treatment: treating the sudden onset (acute) attack and preventing attacks (prophylaxis). Treatment of an Acute Attack  Medicines are used. These include anti-inflammatory medicines or steroid medicines.   An injection of steroid medicine into the affected joint is sometimes necessary.   The painful joint is rested. Movement can worsen the arthritis.   You may use warm or cold treatments on painful joints, depending which works best for you.   Discuss the use of coffee, vitamin C, or cherries with your caregiver. These may be helpful treatment options.  Treatment to Prevent Attacks After the acute attack subsides, your caregiver may advise prophylactic medicine. These medicines either help your kidneys eliminate uric acid from your body or decrease your uric acid production. You may need to stay on these medicines for a very long time. The early phase of treatment with prophylactic medicine can be associated with an increase in  acute gout attacks. For this reason, during the first few months of treatment, your caregiver may also advise you to take medicines usually used for acute gout treatment. Be sure you understand your caregiver's directions. You should also discuss dietary treatment with your caregiver. Certain foods such as meats and fish  can increase uric acid levels. Other foods such as dairy can decrease levels. Your caregiver can give you a list of foods to avoid. HOME CARE INSTRUCTIONS  Do not take aspirin to relieve pain. This raises uric acid levels.   Only take over-the-counter or prescription medicines for pain, discomfort, or fever as directed by your caregiver.   Rest the joint as much as possible. When in bed, keep sheets and blankets off painful areas.   Keep the affected joint raised (elevated).   Use crutches if the painful joint is in your leg.   Drink enough water and fluids to keep your urine clear or pale yellow. This helps your body get rid of uric acid. Do not drink alcoholic beverages. They slow the passage of uric acid.   Follow your caregiver's dietary instructions. Pay careful attention to the amount of protein you eat. Your daily diet should emphasize fruits, vegetables, whole grains, and fat-free or low-fat milk products.   Maintain a healthy body weight.  SEEK MEDICAL CARE IF:  You have an oral temperature above 100.5.   You develop diarrhea, vomiting, or any side effects from medicines.   You do not feel better in 24 hours, or you are getting worse.  SEEK IMMEDIATE MEDICAL CARE IF:  Your joint becomes suddenly more tender and you have:   Chills.   An oral temperature above 100.5, not controlled by medicine.  MAKE SURE YOU:  Understand these instructions.   Will watch your condition.   Will get help right away if you are not doing well or get worse.  Document Released: 10/15/2000 Document Re-Released: 04/07/2010 [facility] Patient Information 2011 [place].

## 2011-02-25 ENCOUNTER — Encounter: Payer: Self-pay | Admitting: Family Medicine

## 2011-02-25 NOTE — Assessment & Plan Note (Signed)
Will check uric acid level today. Will start on allopurinol and cozaar. Will recheck uric acid level in 4-6 weeks

## 2011-02-25 NOTE — Assessment & Plan Note (Signed)
Stable on current regimen. Will transition to cozaar from lisnopril/HCTZ in setting of concurrent gout. Wil follow up BPs in 1 month to reassess. Next step would be addition of norvasc. HR @ 60; likely at max dose of metoprolol

## 2011-02-25 NOTE — Assessment & Plan Note (Signed)
Overall stable per report. Will adjust regimen pending A1C today. Most recent A1C @ 8.7 11/2010. Goal A1C <8; A1C <7 would be ideal

## 2011-02-26 ENCOUNTER — Encounter: Payer: Self-pay | Admitting: Family Medicine

## 2011-02-26 ENCOUNTER — Telehealth: Payer: Self-pay | Admitting: Family Medicine

## 2011-02-26 NOTE — Telephone Encounter (Signed)
Called left VM for pt about lab results and need to increase lantus to 70 units daily with most recent A1C @ 8.7. Hypoglycemic red flags reviewed. Letter also sent.

## 2011-03-16 ENCOUNTER — Encounter: Payer: Self-pay | Admitting: Sports Medicine

## 2011-03-19 NOTE — Op Note (Signed)
NAME:  Adam [name], Adam [name]:  [id number]   MEDICAL RECORD NO.:  [id number]          PATIENT TYPE:  INP   LOCATION:  2313                         FACILITY:  [facility]   PHYSICIAN:  Rosetta Posner, M.D.    DATE OF BIRTH:  1946/01/08   DATE OF PROCEDURE:  06/21/2006  DATE OF DISCHARGE:                                 OPERATIVE REPORT   PREOPERATIVE DIAGNOSIS:  Large junctional renal abdominal aortic aneurysm.   POSTOPERATIVE DIAGNOSIS:  Large junctional renal abdominal aortic aneurysm  with dense adhesions to prior abdominal wall mesh.   PROCEDURE:  1. Abdominal exploration with lysis of dense adhesions to the anterior      abdominal wall mesh and small bowel.  2. Resection graft abdominal aortic aneurysm with an 18 x 9 Hemashield      aorto bi-external iliac bypass.   SURGEON:  Curt Jews, MD   ASSISTANT:  Shanon Rosser, MD   ANESTHESIA:  General endotracheal.   COMPLICATIONS:  None.   CONDITION TO RECOVERY ROOM:  Stable.   PROCEDURE IN DETAIL:  The patient was taken to the operating room, placed in  supine position.  The abdomen and both groins were draped in the usual  sterile fashion.  An incision was made through the prior midline incision  from the level of the xyphoid and 2 mm below the umbilicus.  The patient had  prior mesh in the abdominal wall for a ventral hernia repair.  The abdomen  and the peritoneum was entered above this, below the level of the xyphoid.  There was extensive dense adhesion to the small bowel up to the mesh.  The  majority of this was taken down sharply.  There was one segment of mesh that  was densely adherent to the small bowel and could not be removed.  For this  reason the mesh was left intact with the serosa of the small bowel.  The  remaining adhesions of the small bowel were lysed to give exposure to the  aorta.  The Omni-Tract retractor was used for exposure.  The transverse  colon and omentum were reflected superiorly and  the small bowel was  retracted to the right.  The duodenum was mobilized off the aneurysm.  The  patient had a large 8.5 cm infrarenal aneurysm.  The patient had a  retroaortic left renal vein.  The aorta was encircled below the level of the  renal arteries.  There were two small accessory renal arteries, one on each,  left and right side, and these were ligated and divided for exposure.  The  aorta was encircled below the level of the renal arteries.  The dissection  was continued down under the bifurcation.  The common iliac arteries  bilaterally were non-aneurysmal; however, they were extensively calcified  and too extensively calcified for anastomosis.  The external iliac arteries  were exposed through retroperitoneal incisions, prior to their exit at the  groin bilaterally and were of good caliber.  They had minimal  atherosclerotic change.  These were encircled with blue vessel loops.  A  tunnel was created from the  level of the external iliac up to the aortic  bifurcation, taking care to pass behind the level of the ureters.  The  patient was given 25 gm of Mannitol and 9000 units of intravenous heparin.  After adequate insufflation time the aorta was occluded below the level of  the renal arteries with a Harken clamp.  The iliac common iliac arteries  were occluded bilaterally with Henley clamps.  The aorta was opened  longitudinally with electrocautery.  A large amount of mural thrombus was  removed.  The lumbar back bleeders were controlled with 0 Silk figure-eight  sutures.  The aorta was transected below the level of the proximal clamp.  The distal aorta was then endarterectomized and the distal aorta was over-  sewn with 3-0 Prolene suture at the level of the aortic bifurcation.  Next,  the 18 x 9 Hemashield graft was brought into the field and was cut to the  appropriate length and using the felt strips reinforcement was sewn end-to-  end to the aorta with a running 3-0  Prolene suture.  The anastomosis was  tested and found to be adequate.  Next, the right and left limb of the graft  were brought to their respective groin to the prior retroperitoneal tunnel.  The external iliac arteries were occluded proximally and distally  bilaterally and were opened with an #11 blade and Potts scissors.  The graft  was cut to the appropriate length, was spatulated and sewn end-to-side to  the external iliac artery bilaterally with running 5-0 Prolene sutures.  Prior to completion of each anastomoses, the usual flush maneuvers were  undertaken.  Next, the anastomoses were completed.  The patient had normal  femoral pulses bilaterally.  The patient was given 50 mg of protamine to  reverse the heparin.  The retroperitoneum was closed over the iliac and  anastomoses bilaterally with running 2-0 Vicryl sutures.  Next, the aneurysm  wall was closed with 2-0 Vicryl  to close the sac over the aneurysm.  Next,  retroperitoneum was closed with 2-0 Vicryl sutures.  The small bowel was  rinsed entirely and found to be without injuries and placed back in the  pelvis.  The transverse colon and omentum were placed over this.  The  midline was closed with a #1 Prolene suture being brought together and tying  in the middle.  This incorporated the mesh where present as well.  The skin  was closed with skin clips.  A sterile dressing was applied and the patient  was taken to the recovery room in stable condition.      Rosetta Posner, M.D.  Electronically Signed     TFE/MEDQ  D:  06/21/2006  T:  06/21/2006  Job:  [id number]   cc:   Marshall Cork. Jeffie Pollock, M.D.  Biagio Borg, MD  Denice Bors. Stanford Breed, MD, [facility]
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
## 2011-03-19 NOTE — Op Note (Signed)
NAME:  Adam [name], Adam [name]            ACCOUNT NO.:  [id number]   MEDICAL RECORD NO.:  [id number]          PATIENT TYPE:  AMB   LOCATION:  SDS                          FACILITY:  [facility]   PHYSICIAN:  Rosetta Posner, M.D.    DATE OF BIRTH:  26-Jan-1946   DATE OF PROCEDURE:  06/15/2006  DATE OF DISCHARGE:                                 OPERATIVE REPORT   PREOPERATIVE DIAGNOSIS:  Juxtarenal abdominal aortic aneurysm.   POSTOP DIAGNOSIS:  Juxtarenal abdominal aortic aneurysm.   PROCEDURE:  Aortogram with bilateral lower extremity runoff.   SURGEON:  Rosetta Posner, M.D.   ANESTHESIA:  1% lidocaine local.   COMPLICATIONS:  None.   DISPOSITION:  To holding area stable.   PROCEDURE IN DETAIL:  The patient was taken to the peripheral vascular  catheter lab, placed in the supine position where the area of both groins  were prepped and draped in the usual sterile fashion.  Using local  anesthesia and a single wall puncture the right common femoral artery was  entered; and a guidewire was passed up to the level of the suprarenal aorta.  A 5-French sheath was passed over the guidewire; and a pigtail catheter was  positioned at the level of the suprarenal aorta.  An AP projection and  lateral projection at this level were obtained.  The patient had widely  patent celiac and superior mesenteric arteries.  There were widely patent  renal arteries as well.  The patient had aneurysm beginning at the level of  the renal arteries.   Next, the pigtail catheter was pulled down to the level above the  bifurcation.  There was no evidence of aneurysmal change in the iliac  arteries bilaterally.  The patient had normal caliber internal, external,  and common iliac arteries bilaterally.  There was some moderate stenosis at  the takeoff of the right common iliac artery.  Long leg runoff was then  obtained.  This revealed patent common femoral and superficial femoral  arteries bilaterally.  There was  extensive narrowing in both the superficial  femoral arteries, at the level of the adductor canal with subtotal occlusion  on both sides.  The popliteal artery, themselves, were widely patent.  The  patient had had prior bilateral knee replacements making visualization  behind it very difficult.  There was runoff via primarily the peroneal  artery on the left, with occlusion of the posterior tibial artery.  The  anterior tibial artery was patent proximally and did not visualize distally.  On the right leg, again, peroneal artery was the dominant runoff vessel with  a very small atretic anterior tibial and occluded posterior tibial artery.  The patient tolerated the procedure without immediate complications; and was  transferred to the holding area in stable condition.   FINDINGS:  1. Juxtarenal Dominick aneurysm extending down to the aortic bifurcation.  2. Severe bilateral peripheral vascular occlusive disease with subtotal      SFA occlusions bilaterally; and severe tibial disease with peroneal      artery runoff bilaterally.      Rosetta Posner, M.D.  Electronically Signed  TFE/MEDQ  D:  06/15/2006  T:  06/15/2006  Job:  [id number]   cc:   Marshall Cork. Jeffie Pollock, M.D.
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
## 2011-03-19 NOTE — Discharge Summary (Signed)
[place]. [facility]  Patient:    Adam [name], Adam [name]                     MRN: [id number] Adm. Date:  [id number] Disc. Date: 03/04/00 Attending:  Lily Lovings Dictator:   [name], P.A.-C. CC:         Alethia Berthold, M.D. - Family Practice                           Discharge Summary  DATE OF BIRTH:  October 25, 1946  DISCHARGE DIAGNOSES:  1. Coronary artery disease.  2. Status post inferior myocardial infarction with unsuccessful opening     of the distal right coronary artery occlusion, medically treated.  3. Hyperlipidemia.  4. Hypertension.  5. Hypertension.  6. Diabetes mellitus type 2.  7. Degenerative joint disease.  8. Status post bilateral total knee arthroplasty.  9. Status post appendectomy. 10. Status post colon resection.  HISTORY OF PRESENT ILLNESS:  This 65 year old African-American male with the above-noted history and no cardiac history prior to admission, developed substernal chest pain around 9 a.m. on the day of admission.  He had associated shortness f breath, nausea, diaphoresis, but no radiating symptoms.  The pain waxed and waned all day.  This prompted him to come to the emergency room.  Electrocardiogram changes were noted.  These included inferolateral ST segment elevations.  PHYSICAL EXAMINATION:  GENERAL:  On admission revealed a well-developed, well-nourished male, in mild distress.  VITAL SIGNS:  Blood pressure 98/50, pulse 80.  NECK:  Supple, without jugular venous distention or bruits.  CHEST:  Clear to auscultation, with normal expansion.  CARDIOVASCULAR:  A regular rate and rhythm.  No murmurs, rubs, or gallops.  EXTREMITIES:  With 2+ femoral pulses, no bruits.  LABORATORY DATA:  Hemoglobin 13.3, hematocrit 38.6, WBC 10, platelet count 314,000. INR 1.2.  Sodium 133, potassium 4.5, chloride 102, BUN 16, creatinine 1.1, glucose 139.  SGOT 52. CPK 857, MB 123.4, troponin I 1.02.  HOSPITAL COURSE:   The patient was taken emergently to the cardiac catheterization laboratory.  The results of the coronary arteriography revealed a normal left main. Left anterior descending with 25% stenosis midvessel.  A second diagonal with a 25% stenosis at its origin.  The left circumflex with 25% stenosis proximally and 25% midvessel.  Three obtuse marginals noted.  Right coronary artery with diffuse proximal 30% stenosis.  The distal vessel had a 100% occlusion right after the acute margin, with TIMI-0 flow into the distal vessel.  There appeared to be a large amount of thrombus burden.  The acute margin itself had 99% stenosis. Faint left to right collaterals were noted filling the distal RCA.  Moderate akinesis of the inferior wall was noted.  The ejection fraction estimated at 55%-60%.  An unsuccessful attempt was made at opening the occlusion in the RCA.  The patient was returned to the CCU and was continued on ReoPro for completion of a 12-hour infusion.  It was decided to treat the patient medically with aspirin and beta blocker.  The patient remained stable after the cardiac catheterization without  complications.  His beta blocker was increased throughout his admission.  It was felt that an ACE inhibitor could be initiated at some point; however, the patients blood pressure was never high enough to tolerate the addition of the ACE inhibitor. Of note, the patient was on Zestoretic prior to admission.  This was  since discontinued.  The patient began cardiac rehabilitation phase I.  He tolerated is walks well without many problems.  There was a brief mild episode of chest pain on day two of admission with ambulation; therefore Imdur was added to the patients  medical regimen.  DISPOSITION:  On Mar 04, 2000, it was felt that the patient was stable enough for discharge to home.  LABORATORY DATA:  Sodium 137, potassium 3.8, chloride 105, CO2 of 28, glucose 76, BUN 14, creatinine 1.1,  calcium 8.5.  WBC 8.5, hemoglobin 11.6, hematocrit 35.6, MCV 82.6, platelet count 296,000.  Total protein 6.7, albumin 3.2, AST 52, ALT 1, alkaline phosphatase 60, total bilirubin 0.5.  Cardiac enzyme summary:  Total CPK #1 of 857, CPK-MB 123.4, troponin I 1.02.  Total CPK #2 of 1650, MB 199.4, troponin I 5.07.  Total CPK 1144, troponin I 1.40.  Total CPK 752, troponin 3.02.  Lipid  profile:  Total cholesterol 134, triglycerides 175, HDL 30, LDL 69.  DISCHARGE MEDICATIONS:  1. Lopressor 50 mg b.i.d.  2. Elavil 25 mg q.h.s.  3. Zocor 20 mg q.h.s.  4. Enteric-coated aspirin 325 mg q.d.  5. Prilosec 20 mg q.d.  6. Glucotrol XL 5 mg q.d.  7. Indomethacin SR 75 mg b.i.d. p.r.n. gout.  8. Trazodone 100 mg q.h.s. p.r.n.  9. Glucophage 500 mg b.i.d. 10. Nitroglycerin 0.4 mg one sublingual p.r.n. chest pain.  A special note is made that the patients Zestoretic is to be discontinued for now.  ACTIVITIES:  The patient is to refrain from any driving, sexual activity, or exertional activities for seven days.  DIET:  He is to maintain a low-fat, low-cholesterol, low-sodium, diabetic diet.   INSTRUCTIONS:  He is to watch his groin for any increased swelling or bleeding. To call our office with concerns.  FOLLOWUP:  The patient will be contacted by our office for a follow-up appointment, to be scheduled with Dr. Denice Bors. Crenshaw in two weeks.  NOTATION:  As noted above, the patients LDL is optimal with his statin therapy;  however, his HDL is considerably low, and with his given risk factors for coronary artery disease and known coronary artery disease, it may be prudent to do a lipoprotein analysis and consider Niaspan as an additional therapy for his cholesterol, and this can be addressed as an outpatient. DD:  03/04/00 TD:  03/04/00 Job: [id number]
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
## 2011-03-19 NOTE — Cardiovascular Report (Signed)
[place]. [facility]  Patient:    Adam [name], Adam [name]                     MRN: [id number] Proc. Date: 03/01/00 Adm. Date:  [id number] Attending:  Lily Lovings CC:         Denice Bors. Stanford Breed, M.D. [facility]             Cardiac Catheterization Laboratory                        Cardiac Catheterization  PROCEDURES PERFORMED: 1. Left heart catheterization with coronary angiography and left    ventriculography. 2. Attempt to cross a total occlusion of the distal right coronary artery,    which was unsuccessful due to inability to pass a wire across the    occlusion.  INDICATIONS:  Adam [name] is a 65 year old male with diabetes and hypertension.  He developed chest pain at approximately 9 a.m.  He ultimately presented to the emergency room 12 hours after the onset of a chest pain.  He said his pain waxed and waned in severity throughout the day.  Initial ECG showed mild inferior ST segment elevation.  The patient then got ______ and got worse chest pain with more pronounced inferior ST elevation that then improved again.  His cardiac enzymes were at that point positive for myocardial infarction.  Thus was apparent that he presented late with an acute inferior myocardial infarction.  However, because of his somewhat persistent chest pain we opted to take him directly to the catheterization laboratory.  DESCRIPTION OF PROCEDURE:  We placed a 7 French sheath in the right femoral artery, 6 French sheath in the right femoral vein.  Diagnostic catheterization was performed with standard Judkins 6 French catheters.  Contrast was Visipaque which was then changed to Hexabrix.  There were no complications.  CATHETERIZATION RESULTS:  HEMODYNAMICS:  Left ventricular pressure 120/25.  Aortic pressure 120/78. There is no aortic valve gradient.  LEFT VENTRICULOGRAM:  There is moderate akinesis of the inferior wall. Ejection fraction is estimated at 55-60%.  No mitral  regurgitation.  CORONARY ARTERIOGRAPHY:  (Right dominant).  Left main:  Normal.  Left anterior descending:  The LAD has a 25% stenosis in the mid vessel. It gives rise to a small first diagonal and normal sized second diagonal.  The second diagonal has a 25% stenosis at its origin.  Left circumflex:  The left circumflex has a 25% stenosis proximally and a 25% stenosis in the mid vessel.  It gives rise to a small OM-1, a large OM-2 and a small OM-3.  Right coronary artery:  The right coronary artery is a dominant vessel.  It is diffusely calcified with moderate ectasia in the mid vessel.  The proximal vessel has a diffuse 30% stenosis.  In the distal vessel right after the acute margin is a 100% occlusion with TIMI-0 flow into the distal vessel.  There appears to be a large amount of thrombus burden.  The acute margin itself has a 99% stenosis.  There are faint left to right collaterals filling the distal right coronary artery.  IMPRESSION: 1. Normal to mildly decreased left ventricular systolic function with moderate    inferior akinesis secondary to acute inferior myocardial infarction. 2. One-vessel coronary artery disease as described with a total occlusion    of the distal right coronary artery.  At this point in the procedure, the patients pain had almost completely resolved.  However, because of persistent low-grade pain we opted to proceed with an attempt at opening the distal occlusion to the right coronary artery. We used a 7 [place] guiding catheter with side holes.  Heparin and ReoPro were administered per protocol.  We initially attempted to cross the lesion with a Hi-Torque Floppy wire.  This wire would not cross the lesion.  The complicating factors were the presence of an acute marginal branch coming off right at the occlusion at angulation of the vessel at the site of the occlusion, as well as an apparently significant large amount of organized thrombus which  clearly was several hours old.  We then attempted to cross the lesion with a Choice PT wire and that was also unsuccessful.  At that point we used backup with an angioplasty balloon and Hi-Torque Floppy wire which again would not cross the lesion.  We then tried a Hi-Torque intermediate wire with balloon backup and although we were able to point this wire in the right direction it failed to cross the lesion in spite of lengthy attempts.  At that point, the patient was completely pain-free and hemodynamically stable.  It was apparent that the lesion would only be opened with great difficulty and that there would be a significant amount of distal thrombus.  Furthermore, the with the patient being pain-free at that point, it was unlikely that we would achieve salvage of myocardium were we able to open the vessel.  Because of these factors we opted to stop the case at that point.  The sheaths were sewn in place and the patient was transported to the CCU in stable condition.  CONCLUSIONS:  Late presentation with an acute inferior myocardial infarction secondary to occlusion of the distal right coronary artery.  Unsuccessful attempt to open this occlusion due to inability to cross the lesion as described above.  The patient is hemodynamically stable and at this point he appears to have completed his infarct.  PLAN:  The patient will be managed in the CCU.  He will be treated medically with aspirin and beta blocker.  As the ReoPro has already been started, we will go ahead and complete a 12-hour infusion. DD:  03/01/00 TD:  03/01/00 Job: [id number]

## 2011-03-30 ENCOUNTER — Encounter: Payer: Self-pay | Admitting: Family Medicine

## 2011-03-30 ENCOUNTER — Ambulatory Visit (INDEPENDENT_AMBULATORY_CARE_PROVIDER_SITE_OTHER): Payer: Medicare Other | Admitting: Family Medicine

## 2011-03-30 DIAGNOSIS — I10 Essential (primary) hypertension: Secondary | ICD-10-CM

## 2011-03-30 DIAGNOSIS — E119 Type 2 diabetes mellitus without complications: Secondary | ICD-10-CM

## 2011-03-30 MED ORDER — GLIPIZIDE 10 MG PO TABS: 10.0000 mg | ORAL_TABLET | Freq: Two times a day (BID) | ORAL | Status: DC

## 2011-03-30 MED ORDER — INSULIN GLARGINE 100 UNIT/ML ~~LOC~~ SOLN: 75.0000 [IU] | Freq: Every day | SUBCUTANEOUS | Status: DC

## 2011-03-30 NOTE — Patient Instructions (Signed)
It was good to see you today We will stay on the same blood pressure regimen for now I am starting you on glipizide for your blood sugars. Take it with meals Stop the allopurinol for now. Call or set up an appointment whenever you have a gout flare Otherwise call with any questions,  God Bless, Shanda Howells

## 2011-03-31 NOTE — Progress Notes (Signed)
  Subjective:    Patient ID: Adam [name], male    DOB: 05-09-1946, 65 y.o.   MRN: [id number]  HPI Pt here for chronic problem follow up: HTN: Overall well controlled. BPs at home in 110s-130s. Has been compliant with BP regimen. No HA, CP, SOB.  DM: Recently increased to lantus 75 units daily for elevated blood sugars. Also taking metformin 1g bid. Blod sugars have been ranging in the 160s to 210s. Minimal polyuria, polydypsia.  Gout: Pt reports increased "jitteriness" and "uneasiness" with taking allopurinol. Pt feels that he cannot tolerate allopurinol. No gout flares since last clinical visit. Also on cozaar for HTN. Most recent uric acid level 5.4.  Review of Systems See HPI    Objective:   Physical Exam Gen: up in chair, NAD, obese  CV: RRR, no murmurs auscultated  PULM: CTAB, no wheezes, rales, rhoncii  ABD: obese/NT/+ bowel sounds  EXT: 2+ peripheral pulses, trace edema     Assessment & Plan:  HTN: Stable on current regimen. Brought in BP log. SBPs in 110s-120s. Question white coat hypertension contributing to BP today. Will follow up in 2-4 weeks.  DM: Will start pt on glipizide for meal associated hyperglycemia. Will continue with lantus 75 daily and metformin 1g BID.  Gout: Will d/c allopurinol as pt reports subjective intolerance. Will continue cozaar. WIll consider colchicine if pt has a gout flare in the future.

## 2011-04-05 MED ORDER — INSULIN GLARGINE 100 UNIT/ML ~~LOC~~ SOLN: SUBCUTANEOUS | Status: DC

## 2011-04-05 NOTE — Assessment & Plan Note (Addendum)
Will start pt on glipizide 10mg  bid  for meal associated hyperglycemia. Will continue with lantus 75 daily and metformin 1g BID

## 2011-04-05 NOTE — Assessment & Plan Note (Signed)
Stable on current regimen. Brought in BP log. SBPs in 110s-120s. Question white coat hypertension contributing to BP today. Will follow up in 2-4 weeks

## 2011-05-12 ENCOUNTER — Encounter: Payer: Self-pay | Admitting: Family Medicine

## 2011-05-12 ENCOUNTER — Ambulatory Visit (INDEPENDENT_AMBULATORY_CARE_PROVIDER_SITE_OTHER): Payer: Medicare Other | Admitting: Family Medicine

## 2011-05-12 VITALS — BP 138/77 | HR 62 | Temp 98.0°F | Ht 74.0 in | Wt 303.9 lb

## 2011-05-12 DIAGNOSIS — R0602 Shortness of breath: Secondary | ICD-10-CM

## 2011-05-12 DIAGNOSIS — R229 Localized swelling, mass and lump, unspecified: Secondary | ICD-10-CM

## 2011-05-12 DIAGNOSIS — R2240 Localized swelling, mass and lump, unspecified lower limb: Secondary | ICD-10-CM

## 2011-05-12 DIAGNOSIS — I5030 Unspecified diastolic (congestive) heart failure: Secondary | ICD-10-CM

## 2011-05-12 DIAGNOSIS — I10 Essential (primary) hypertension: Secondary | ICD-10-CM

## 2011-05-12 DIAGNOSIS — R609 Edema, unspecified: Secondary | ICD-10-CM

## 2011-05-12 DIAGNOSIS — E119 Type 2 diabetes mellitus without complications: Secondary | ICD-10-CM

## 2011-05-12 LAB — COMPREHENSIVE METABOLIC PANEL
Albumin: 4.2 g/dL (ref 3.5–5.2)
Alkaline Phosphatase: 63 U/L (ref 39–117)
BUN: 17 mg/dL (ref 6–23)
Creat: 1.19 mg/dL (ref 0.50–1.35)
Glucose, Bld: 140 mg/dL — ABNORMAL HIGH (ref 70–99)
Total Bilirubin: 0.5 mg/dL (ref 0.3–1.2)

## 2011-05-12 LAB — POCT GLYCOSYLATED HEMOGLOBIN (HGB A1C): Hemoglobin A1C: 8.2

## 2011-05-12 MED ORDER — INSULIN GLARGINE 100 UNIT/ML ~~LOC~~ SOLN: SUBCUTANEOUS | Status: DC

## 2011-05-12 NOTE — Patient Instructions (Addendum)
It was good to see you today Try cutting back on your salt  I am increasing you lantus for your blood sugars Be sure to cut back on your carbs I am checking some blood work today Call if you have any questions God Olevia Bowens,  Shanda Howells

## 2011-05-13 ENCOUNTER — Encounter: Payer: Self-pay | Admitting: Family Medicine

## 2011-05-16 DIAGNOSIS — R2240 Localized swelling, mass and lump, unspecified lower limb: Secondary | ICD-10-CM | POA: Insufficient documentation

## 2011-05-16 NOTE — Assessment & Plan Note (Addendum)
Encouraged decreased carbohydrate intake. Will increase lantus to 80units daily. Will check A1C.

## 2011-05-16 NOTE — Assessment & Plan Note (Signed)
Will check BNP. No overt signs of volume overload on exam. Encouraged to decrease salt intake.

## 2011-05-16 NOTE — Progress Notes (Signed)
  Subjective:    Patient ID: [name], male    DOB: 12-Sep-1946, 65 y.o.   MRN: [id number]  HPI Pt here for chronic problem follow up: HTN: Checks his BP when he goes to [employer]. BPs ranging in 110s-120s per pt. No HA, CP, SOB. Is still eating high salt diet.  DM: Checking blood sugars daily. Ranging in 140s-160s. Says his sugar/carb intake has increased over last 3 months because of all those "summer fruits and cobblers". Mild polyphagia per pt. No polyuria, polydypsia LE swelling: Pt reports intermittent LE swelling over this time period. Is ass'd with high salt intake. Also worse after prolonged periods of standing. No orthopnea, DOE.    Review of Systems See HPI, otherwise 12 point ROS negative.  Objective:   Physical Exam Gen: up in chair, NAD, obese   CV: RRR, no murmurs auscultated   PULM: CTAB, no wheezes, rales, rhoncii   ABD: obese/NT/+ bowel sounds   EXT: 2+ peripheral pulses, trace edema  Assessment & Plan:

## 2011-05-16 NOTE — Assessment & Plan Note (Signed)
Overall stable. Will continue with current regimen. Will check Cr and K.

## 2011-05-24 ENCOUNTER — Other Ambulatory Visit: Payer: Self-pay | Admitting: Family Medicine

## 2011-05-24 NOTE — Telephone Encounter (Signed)
Refill request

## 2011-06-16 ENCOUNTER — Other Ambulatory Visit: Payer: Self-pay | Admitting: Family Medicine

## 2011-06-16 NOTE — Telephone Encounter (Signed)
Refill request

## 2011-06-22 ENCOUNTER — Ambulatory Visit: Payer: Medicare Other | Admitting: Family Medicine

## 2011-06-23 ENCOUNTER — Encounter: Payer: Self-pay | Admitting: Family Medicine

## 2011-08-03 ENCOUNTER — Ambulatory Visit: Payer: Medicare Other | Admitting: Family Medicine

## 2011-08-28 ENCOUNTER — Inpatient Hospital Stay (INDEPENDENT_AMBULATORY_CARE_PROVIDER_SITE_OTHER)
Admission: RE | Admit: 2011-08-28 | Discharge: 2011-08-28 | Disposition: A | Discharge status: Home / Self Care | Payer: PRIVATE HEALTH INSURANCE | Source: Ambulatory Visit | Attending: Emergency Medicine | Admitting: Emergency Medicine

## 2011-08-28 ENCOUNTER — Ambulatory Visit (INDEPENDENT_AMBULATORY_CARE_PROVIDER_SITE_OTHER): Payer: PRIVATE HEALTH INSURANCE

## 2011-08-28 DIAGNOSIS — S63509A Unspecified sprain of unspecified wrist, initial encounter: Secondary | ICD-10-CM

## 2011-10-06 ENCOUNTER — Ambulatory Visit (INDEPENDENT_AMBULATORY_CARE_PROVIDER_SITE_OTHER): Payer: PRIVATE HEALTH INSURANCE | Admitting: Family Medicine

## 2011-10-06 ENCOUNTER — Encounter: Payer: Self-pay | Admitting: Family Medicine

## 2011-10-06 VITALS — BP 147/79 | HR 82 | Ht 75.0 in | Wt 302.0 lb

## 2011-10-06 DIAGNOSIS — E119 Type 2 diabetes mellitus without complications: Secondary | ICD-10-CM

## 2011-10-06 DIAGNOSIS — I10 Essential (primary) hypertension: Secondary | ICD-10-CM

## 2011-10-06 DIAGNOSIS — K439 Ventral hernia without obstruction or gangrene: Secondary | ICD-10-CM | POA: Insufficient documentation

## 2011-10-06 LAB — BASIC METABOLIC PANEL
CO2: 22 mEq/L (ref 19–32)
Calcium: 9.5 mg/dL (ref 8.4–10.5)
Potassium: 3.9 mEq/L (ref 3.5–5.3)
Sodium: 141 mEq/L (ref 135–145)

## 2011-10-06 MED ORDER — INSULIN GLARGINE 100 UNIT/ML ~~LOC~~ SOLN: SUBCUTANEOUS | Status: DC

## 2011-10-06 MED ORDER — GLIPIZIDE 10 MG PO TABS: 20.0000 mg | ORAL_TABLET | Freq: Two times a day (BID) | ORAL | Status: DC

## 2011-10-06 NOTE — Assessment & Plan Note (Addendum)
Mildly elevated today in the setting of high salt intake. Patient instructed to decrease salt. Will check creatinine and potassium today. Next step in management may be addition of calcium channel blocker. Will defer treatment pending renal followup.

## 2011-10-06 NOTE — Assessment & Plan Note (Signed)
Will check A1c today. Plan to increase glipizide to max dose of 20 twice a day. Patient is instructed to take this with meals as to avoid hypoglycemia. Hypoglycemic red flags were also discussed.

## 2011-10-06 NOTE — Progress Notes (Signed)
   S:  Patient is here for chronic problem followup. DM: Checking CBGs?:yes How Often?:2-3 times per day Blood Sugar Range:120s-200s  Polyuria, polydypsia, polyphagia:no Symptomatic Hypoglycemia:no Medication Compliance?:yes ACE if hypertensive/obesity?:yes Statin if LDL >100?:yes   HTN:  Checking BPS Daily ?:yes BP Range:140s when her goes to doctor  Any HA, CP, SOB?:no Any Medication Side Effects:no Medication Compliance:yes Salt intake?:yes, eats viennas intermittently  Exercise?:intermittent light activity. Overall exercise has significantly decreased since recently moving.  Weight Loss?:no. + noted weight gain.   Ventral hernia: Patient reports a 2-3 year history of ventral hernia status post aneurysm repair. Patient states he's noticed worsening of abdominal distention as well as having intermittent abdominal pain and diarrhea. No reports of frank emesis. Patient denies any strenuous activity that may have exacerbated this.     O:  Current outpatient prescriptions:aspirin ([employer] ASPIRIN) 81 MG chewable tablet, Chew 81 mg by mouth daily.  , Disp: , Rfl: ;  cilostazol (PLETAL) 100 MG tablet, TAKE ONE TABLET TWICE DAILY, Disp: 60 tablet, Rfl: 6;  CRESTOR 40 MG tablet, TAKE 1 TABLET EACH EVENING, Disp: 30 tablet, Rfl: 6;  glipiZIDE (GLUCOTROL) 10 MG tablet, Take 1 tablet (10 mg total) by mouth 2 (two) times daily with a meal., Disp: 60 tablet, Rfl: 11 insulin aspart (NOVOLOG) 100 UNIT/ML injection, Inject 5 units daily 15-30 mins before meals.  Dispense 1 month supply. , Disp: , Rfl: ;  insulin glargine (LANTUS) 100 UNIT/ML injection, 80 units sub q daily., Disp: 40 mL, Rfl: 3;  losartan (COZAAR) 50 MG tablet, Take 1 tablet (50 mg total) by mouth daily., Disp: 30 tablet, Rfl: 11;  metFORMIN (GLUCOPHAGE) 1000 MG tablet, TAKE ONE TABLET TWICE DAILY, Disp: 60 tablet, Rfl: 6 metoprolol (LOPRESSOR) 100 MG tablet, TAKE ONE TABLET TWICE DAILY, Disp: 60 tablet, Rfl: 1;  NEXIUM 40  MG capsule, TAKE ONE (1) CAPSULE EACH DAY, Disp: 30 each, Rfl: 6;  amitriptyline (ELAVIL) 25 MG tablet, Take 3 tablets by mouth every night , Disp: , Rfl:   Wt Readings from Last 3 Encounters:  10/06/11 302 lb (136.986 kg)  05/12/11 303 lb 14.4 oz (137.848 kg)  03/30/11 297 lb (134.718 kg)   Temp Readings from Last 3 Encounters:  05/12/11 98 F (36.7 C) Oral  03/30/11 98 F (36.7 C) Oral  02/24/11 97.9 F (36.6 C) Oral   BP Readings from Last 3 Encounters:  10/06/11 147/79  05/12/11 138/77  03/30/11 153/88   Pulse Readings from Last 3 Encounters:  10/06/11 82  05/12/11 62  03/30/11 61    General: alert, cooperative and moderately obese HEENT: PERRLA and extra ocular movement intact Heart: S1, S2 normal, no murmur, rub or gallop, regular rate and rhythm Lungs: clear to auscultation, no wheezes or rales and unlabored breathing Abdomen: + diastasis recti with increased intraabdominal pressure, + bowel sounds, no marked abd tenderness. Extremities: extremities normal, atraumatic, no cyanosis or edema and trace edema bilaterally  Skin:no rashes  A/P:

## 2011-10-06 NOTE — Assessment & Plan Note (Signed)
Plan to refer patient to renal for this as to optimize medical management. Will check Cr and K today.

## 2011-10-06 NOTE — Assessment & Plan Note (Signed)
No current signs of obstruction. Will refer patient to surgery for management of this. Discussed red flags for reevaluation. Patient agreeable to plan.

## 2011-10-06 NOTE — Patient Instructions (Addendum)
It was good to see you today  I have increased your glipizide  I am referring you to the surgeon and the kidney doctors Come back to see me in 1 month  Call with any questions,  God Bless, Shanda Howells MD   Diabetes and Exercise Regular exercise is important and can help:   Control blood glucose (sugar).   Decrease blood pressure.    Control blood lipids (cholesterol, triglycerides).   Improve overall health.  BENEFITS FROM EXERCISE  Improved fitness.   Improved flexibility.   Improved endurance.   Increased bone density.   Weight control.   Increased muscle strength.   Decreased body fat.   Improvement of the body's use of insulin, a hormone.   Increased insulin sensitivity.   Reduction of insulin needs.   Reduced stress and tension.   Helps you feel better.  People with diabetes who add exercise to their lifestyle gain additional benefits, including:  Weight loss.   Reduced appetite.   Improvement of the body's use of blood glucose.   Decreased risk factors for heart disease:   Lowering of cholesterol and triglycerides.   Raising the level of good cholesterol (high-density lipoproteins, HDL).   Lowering blood sugar.   Decreased blood pressure.  TYPE 1 DIABETES AND EXERCISE  Exercise will usually lower your blood glucose.   If blood glucose is greater than 240 mg/dl, check urine ketones. If ketones are present, do not exercise.   Location of the insulin injection sites may need to be adjusted with exercise. Avoid injecting insulin into areas of the body that will be exercised. For example, avoid injecting insulin into:   The arms when playing tennis.   The legs when jogging. For more information, discuss this with your caregiver.   Keep a record of:   Food intake.   Type and amount of exercise.   Expected peak times of insulin action.   Blood glucose levels.  Do this before, during, and after exercise. Review your records with your  caregiver. This will help you to develop guidelines for adjusting food intake and insulin amounts.  TYPE 2 DIABETES AND EXERCISE  Regular physical activity can help control blood glucose.   Exercise is important because it may:   Increase the body's sensitivity to insulin.   Improve blood glucose control.   Exercise reduces the risk of heart disease. It decreases serum cholesterol and triglycerides. It also lowers blood pressure.   Those who take insulin or oral hypoglycemic agents should watch for signs of hypoglycemia. These signs include dizziness, shaking, sweating, chills, and confusion.   Body water is lost during exercise. It must be replaced. This will help to avoid loss of body fluids (dehydration) or heat stroke.  Be sure to talk to your caregiver before starting an exercise program to make sure it is safe for you. Remember, any activity is better than none.  Document Released: 01/08/2004 Document Revised: 06/30/2011 Document Reviewed: 04/24/2009 [facility] Patient Information 2012 [place].

## 2011-10-18 ENCOUNTER — Other Ambulatory Visit: Payer: Self-pay | Admitting: Family Medicine

## 2011-10-18 NOTE — Telephone Encounter (Signed)
Refill request

## 2011-11-04 ENCOUNTER — Ambulatory Visit (INDEPENDENT_AMBULATORY_CARE_PROVIDER_SITE_OTHER): Payer: PRIVATE HEALTH INSURANCE | Admitting: General Surgery

## 2011-11-08 ENCOUNTER — Encounter (INDEPENDENT_AMBULATORY_CARE_PROVIDER_SITE_OTHER): Payer: Self-pay | Admitting: General Surgery

## 2011-11-09 ENCOUNTER — Ambulatory Visit: Payer: PRIVATE HEALTH INSURANCE | Admitting: Family Medicine

## 2011-11-11 ENCOUNTER — Ambulatory Visit: Payer: PRIVATE HEALTH INSURANCE | Admitting: Family Medicine

## 2011-11-19 ENCOUNTER — Ambulatory Visit: Payer: PRIVATE HEALTH INSURANCE | Admitting: Family Medicine

## 2011-12-09 ENCOUNTER — Ambulatory Visit (INDEPENDENT_AMBULATORY_CARE_PROVIDER_SITE_OTHER): Payer: PRIVATE HEALTH INSURANCE | Admitting: General Surgery

## 2011-12-09 ENCOUNTER — Encounter (INDEPENDENT_AMBULATORY_CARE_PROVIDER_SITE_OTHER): Payer: Self-pay | Admitting: General Surgery

## 2011-12-09 ENCOUNTER — Encounter (INDEPENDENT_AMBULATORY_CARE_PROVIDER_SITE_OTHER): Payer: Self-pay

## 2011-12-09 VITALS — BP 132/78 | HR 68 | Temp 97.6°F | Resp 18 | Ht 75.0 in | Wt 314.2 lb

## 2011-12-09 DIAGNOSIS — K430 Incisional hernia with obstruction, without gangrene: Secondary | ICD-10-CM

## 2011-12-09 NOTE — Progress Notes (Signed)
Patient ID: Adam [name], male   DOB: December 20, 1945, 66 y.o.   MRN: [id number]  Chief Complaint  Patient presents with  . Hernia    Ventral     HPI Adam [name] is a 66 y.o. male.  This patient presents for evaluation of a recurrent incisional hernia. He had a prior bowel resection for perforated bowel followed by an incisional hernia repair with mesh. Subsequently he had a abdominal aortic aneurysm repaired as well through the mesh.  He first noticed a bulge after his aneurysm surgery but states that it has been increasing in size and has been causing some symptoms. He states that he has noticed a bulge for while and now has some discomfort after eating which he describes as an "achey" pain.  He states the pain didn't last as long as it takes to digest his food and then goes away. He states it is current on his colonoscopy although he does have some constipation which is normal for him and he recently started taking a stool softener. Of note, he does have a history of a small myocardial infarction in 2002.  HPI  Past Medical History  Diagnosis Date  . Cancer   . Diabetes mellitus   . Hyperlipidemia   . Hypertension   . Ventral hernia     Past Surgical History  Procedure Date  . Abdominal aortic aneurysm repair 2009    History reviewed. No pertinent family history.  Social History History  Substance Use Topics  . Smoking status: Former Smoker    Quit date: 11/01/1994  . Smokeless tobacco: Not on file  . Alcohol Use: No    Allergies  Allergen Reactions  . Nitroglycerin Other (See Comments)    Drop in blood pressure, caused dizziness.    Current Outpatient Prescriptions  Medication Sig Dispense Refill  . amitriptyline (ELAVIL) 25 MG tablet Take 3 tablets by mouth every night       . aspirin (BAYER CHILDRENS ASPIRIN) 81 MG chewable tablet Chew 81 mg by mouth daily.        . cilostazol (PLETAL) 100 MG tablet TAKE ONE TABLET TWICE DAILY  60 tablet  6  . CRESTOR 40 MG  tablet TAKE 1 TABLET EACH EVENING  30 tablet  6  . glipiZIDE (GLUCOTROL) 10 MG tablet Take 2 tablets (20 mg total) by mouth 2 (two) times daily with a meal.  120 tablet  11  . insulin aspart (NOVOLOG) 100 UNIT/ML injection Inject 5 units daily 15-30 mins before meals.  Dispense 1 month supply.       . insulin glargine (LANTUS) 100 UNIT/ML injection 80 units sub q daily.  50 mL  3  . losartan (COZAAR) 50 MG tablet Take 1 tablet (50 mg total) by mouth daily.  30 tablet  11  . metFORMIN (GLUCOPHAGE) 1000 MG tablet TAKE ONE TABLET TWICE DAILY  60 tablet  6  . metoprolol (LOPRESSOR) 100 MG tablet TAKE ONE TABLET TWICE DAILY  60 tablet  6  . NEXIUM 40 MG capsule TAKE ONE (1) CAPSULE EACH DAY  30 each  6    Review of Systems Review of Systems All other review of systems negative or noncontributory except as stated in the HPI  Blood pressure 132/78, pulse 68, temperature 97.6 F (36.4 C), temperature source Temporal, resp. rate 18, height 6\' 3"  (1.905 m), weight 314 lb 3.2 oz (142.52 kg).  Physical Exam Physical Exam Physical Exam  Nursing note and vitals reviewed. Constitutional: She is oriented  to person, place, and time. She appears well-developed and well-nourished. No distress.  HENT:  Head: Normocephalic and atraumatic.  Mouth/Throat: No oropharyngeal exudate.  Eyes: Conjunctivae and EOM are normal. Pupils are equal, round, and reactive to light. Right eye exhibits no discharge. Left eye exhibits no discharge. No scleral icterus.  Neck: Normal range of motion. Neck supple. No tracheal deviation present.  Cardiovascular: Normal rate, regular rhythm, normal heart sounds and intact distal pulses.   Pulmonary/Chest: Effort normal and breath sounds normal. No stridor. No respiratory distress. She has no wheezes.  Abdominal: Soft. Bowel sounds are normal. She exhibits no distension and no mass. There is no tenderness. There is no rebound and no guarding. WHSS in midline with moderate size  ventral hernia which is reducible.   Musculoskeletal: Normal range of motion. She exhibits no edema and no tenderness.  Neurological: She is alert and oriented to person, place, and time.  Skin: Skin is warm and dry. No rash noted. She is not diaphoretic. No erythema. No pallor.  Psychiatric: She has a normal mood and affect. Her behavior is normal. Judgment and thought content normal.    Data Reviewed   Assessment    Recurrent incisional hernia He does have a reducible recurrent incisional hernia on exam and this is increasing in size and becoming more symptomatic for him. He has pain daily and he would like to have this repaired. We discussed the surgical options such as laparoscopic repair versus open repair and I recommended that we obtain a CT scan of the abdomen to evaluate the hernia prior to deciding on the surgical approach. I did review his prior AAA note in his surgeon at that time commented on dense adhesions to the mesh which was placed at his prior hernia repair. From the standpoint, I think that we'll most likely need to perform open repair of what will have more information to decide after a CT scan.    Plan    Will check CT abdomen and obtain cardiac clearance and he will follow up after his scan to plan his procedure.       [place], [place] 12/09/2011, 12:15 PM

## 2011-12-13 ENCOUNTER — Ambulatory Visit
Admission: RE | Admit: 2011-12-13 | Discharge: 2011-12-13 | Disposition: A | Discharge status: Home / Self Care | Payer: PRIVATE HEALTH INSURANCE | Source: Ambulatory Visit | Attending: General Surgery | Admitting: General Surgery

## 2011-12-13 DIAGNOSIS — K430 Incisional hernia with obstruction, without gangrene: Secondary | ICD-10-CM

## 2011-12-13 MED ORDER — IOHEXOL 300 MG/ML  SOLN
125.0000 mL | Freq: Once | INTRAMUSCULAR | Status: AC | PRN
  Administered 2011-12-13: 125 mL via INTRAVENOUS

## 2011-12-15 ENCOUNTER — Encounter (INDEPENDENT_AMBULATORY_CARE_PROVIDER_SITE_OTHER): Payer: Self-pay

## 2011-12-20 ENCOUNTER — Telehealth: Payer: Self-pay | Admitting: Family Medicine

## 2011-12-20 NOTE — Telephone Encounter (Signed)
Is calling to find out when his appt for his Kidney is going to be.

## 2011-12-20 NOTE — Telephone Encounter (Signed)
Called [facility] to find out about appointment for referral that was put in back in December. The referral coordinator is out today and is supposed to call me back in the morning to schedule appointment. I called patient and told him I would call back once I have an appointment.Mikhi Athey, Kevin Fenton

## 2011-12-22 NOTE — Telephone Encounter (Signed)
Faxed new referral to [facility] informing them that the original referral was sent back in December and to please schedule and call patient with appointment.Tahiry Spicer, Kevin Fenton

## 2011-12-23 NOTE — Telephone Encounter (Signed)
Called patient and told him I have called and re-faxed referral to [facility] and if he has not heard anything by tomorrow to give them a call for an appointment.Jamesrobert Ohanesian, Kevin Fenton

## 2012-01-05 ENCOUNTER — Other Ambulatory Visit: Payer: Self-pay | Admitting: Nephrology

## 2012-01-05 DIAGNOSIS — N182 Chronic kidney disease, stage 2 (mild): Secondary | ICD-10-CM

## 2012-01-11 ENCOUNTER — Ambulatory Visit
Admission: RE | Admit: 2012-01-11 | Discharge: 2012-01-11 | Disposition: A | Discharge status: Home / Self Care | Payer: PRIVATE HEALTH INSURANCE | Source: Ambulatory Visit | Attending: Nephrology | Admitting: Nephrology

## 2012-01-11 DIAGNOSIS — N182 Chronic kidney disease, stage 2 (mild): Secondary | ICD-10-CM

## 2012-01-19 ENCOUNTER — Other Ambulatory Visit: Payer: PRIVATE HEALTH INSURANCE

## 2012-02-14 ENCOUNTER — Other Ambulatory Visit: Payer: Self-pay | Admitting: Family Medicine

## 2012-02-15 ENCOUNTER — Ambulatory Visit (INDEPENDENT_AMBULATORY_CARE_PROVIDER_SITE_OTHER): Payer: PRIVATE HEALTH INSURANCE | Admitting: General Surgery

## 2012-02-15 VITALS — BP 152/79 | HR 71 | Temp 97.4°F | Ht 76.0 in | Wt 316.8 lb

## 2012-02-15 DIAGNOSIS — Z01818 Encounter for other preprocedural examination: Secondary | ICD-10-CM

## 2012-02-15 DIAGNOSIS — K432 Incisional hernia without obstruction or gangrene: Secondary | ICD-10-CM

## 2012-02-25 ENCOUNTER — Encounter: Payer: Self-pay | Admitting: Family Medicine

## 2012-02-25 ENCOUNTER — Ambulatory Visit (INDEPENDENT_AMBULATORY_CARE_PROVIDER_SITE_OTHER): Payer: PRIVATE HEALTH INSURANCE | Admitting: Family Medicine

## 2012-02-25 VITALS — BP 163/84 | HR 54 | Ht 76.0 in | Wt 315.0 lb

## 2012-02-25 DIAGNOSIS — K439 Ventral hernia without obstruction or gangrene: Secondary | ICD-10-CM

## 2012-02-25 DIAGNOSIS — I10 Essential (primary) hypertension: Secondary | ICD-10-CM

## 2012-02-25 DIAGNOSIS — Z79899 Other long term (current) drug therapy: Secondary | ICD-10-CM

## 2012-02-25 DIAGNOSIS — N183 Chronic kidney disease, stage 3 unspecified: Secondary | ICD-10-CM

## 2012-02-25 DIAGNOSIS — M109 Gout, unspecified: Secondary | ICD-10-CM

## 2012-02-25 DIAGNOSIS — E119 Type 2 diabetes mellitus without complications: Secondary | ICD-10-CM

## 2012-02-25 LAB — COMPREHENSIVE METABOLIC PANEL
Albumin: 3.9 g/dL (ref 3.5–5.2)
BUN: 23 mg/dL (ref 6–23)
CO2: 24 mEq/L (ref 19–32)
Calcium: 8.8 mg/dL (ref 8.4–10.5)
Chloride: 102 mEq/L (ref 96–112)
Creat: 1.34 mg/dL (ref 0.50–1.35)
Glucose, Bld: 155 mg/dL — ABNORMAL HIGH (ref 70–99)
Potassium: 4.2 mEq/L (ref 3.5–5.3)

## 2012-02-25 LAB — URIC ACID: Uric Acid, Serum: 6.1 mg/dL (ref 4.0–7.8)

## 2012-02-25 NOTE — Progress Notes (Signed)
S:  Pt is here for chronic problem follow up:  Pt is also pending abdominal hernia repair via [facility]CS in the next 1-2 months pending formal cardiology evaluation.   DM: Checking CBGs?:yes How Often?:tid Blood Sugar Range:130s-160s Polyuria, polydypsia, polyphagia:no Symptomatic Hypoglycemia:yes Medication Compliance?:yes ACE if hypertensive/obesity?:yes; ARG Statin if LDL >100?:yes   Lab Results  Component Value Date   LDLCALC 81 09/08/2009   Lab Results  Component Value Date   HGBA1C 8.2 02/25/2012    HTN:  Checking BPS Daily ?:yes BP Range:120s-130s Any HA, CP, SOB?:no Any Medication Side Effects:some mild swelling  Medication Compliance:yes Salt intake?:high salt  Exercise?:tries to weekly  Weight Loss?: no    Review of Systems - Negative except as noted above in HPI, otherwise 12 point ROS negative   O:  Current Outpatient Prescriptions  Medication Sig Dispense Refill  . amitriptyline (ELAVIL) 25 MG tablet Take 3 tablets by mouth every night       . aspirin (BAYER CHILDRENS ASPIRIN) 81 MG chewable tablet Chew 81 mg by mouth daily.        . cilostazol (PLETAL) 100 MG tablet TAKE ONE TABLET TWICE DAILY  60 tablet  6  . CRESTOR 40 MG tablet TAKE 1 TABLET EACH EVENING  30 tablet  11  . glipiZIDE (GLUCOTROL) 10 MG tablet Take 2 tablets (20 mg total) by mouth 2 (two) times daily with a meal.  120 tablet  11  . insulin aspart (NOVOLOG) 100 UNIT/ML injection Inject 5 units daily 15-30 mins before meals.  Dispense 1 month supply.       . insulin glargine (LANTUS) 100 UNIT/ML injection 80 units sub q daily.  50 mL  3  . losartan (COZAAR) 50 MG tablet Take 1 tablet (50 mg total) by mouth daily.  30 tablet  11  . metFORMIN (GLUCOPHAGE) 1000 MG tablet TAKE ONE TABLET TWICE DAILY  60 tablet  11  . metoprolol (LOPRESSOR) 100 MG tablet TAKE ONE TABLET TWICE DAILY  60 tablet  6  . NEXIUM 40 MG capsule TAKE ONE (1) CAPSULE EACH DAY  30 each  11    Wt Readings from Last 3 Encounters:    02/25/12 315 lb (142.883 kg)  02/15/12 316 lb 12.8 oz (143.7 kg)  12/09/11 314 lb 3.2 oz (142.52 kg)   Temp Readings from Last 3 Encounters:  02/15/12 97.4 F (36.3 C) Temporal  12/09/11 97.6 F (36.4 C) Temporal  05/12/11 98 F (36.7 C) Oral   BP Readings from Last 3 Encounters:  02/25/12 163/84  02/15/12 152/79  12/09/11 132/78   Pulse Readings from Last 3 Encounters:  02/25/12 54  02/15/12 71  12/09/11 68     General: alert and cooperative HEENT: PERRLA and extra ocular movement intact Heart: S1, S2 normal, no murmur, rub or gallop, regular rate and rhythm Lungs: clear to auscultation, no wheezes or rales and unlabored breathing Abdomen: obese, non tender,  Extremities: extremities normal, atraumatic, no cyanosis or edema Skin:no rashes, no ecchymoses Neurology: normal without focal findings; diabetic foot exam with no sensory deficits.    A/P:

## 2012-02-25 NOTE — Patient Instructions (Signed)
It was good to see today Stop the glipizide Increase your Lantus 85 units twice a day Followup with me next week about your blood sugars Call if any questions  Diabetes and Exercise Regular exercise is important and can help:   Control blood glucose (sugar).   Decrease blood pressure.    Control blood lipids (cholesterol, triglycerides).   Improve overall health.  BENEFITS FROM EXERCISE  Improved fitness.   Improved flexibility.   Improved endurance.   Increased bone density.   Weight control.   Increased muscle strength.   Decreased body fat.   Improvement of the body's use of insulin, a hormone.   Increased insulin sensitivity.   Reduction of insulin needs.   Reduced stress and tension.   Helps you feel better.  People with diabetes who add exercise to their lifestyle gain additional benefits, including:  Weight loss.   Reduced appetite.   Improvement of the body's use of blood glucose.   Decreased risk factors for heart disease:   Lowering of cholesterol and triglycerides.   Raising the level of good cholesterol (high-density lipoproteins, HDL).   Lowering blood sugar.   Decreased blood pressure.  TYPE 1 DIABETES AND EXERCISE  Exercise will usually lower your blood glucose.   If blood glucose is greater than 240 mg/dl, check urine ketones. If ketones are present, do not exercise.   Location of the insulin injection sites may need to be adjusted with exercise. Avoid injecting insulin into areas of the body that will be exercised. For example, avoid injecting insulin into:   The arms when playing tennis.   The legs when jogging. For more information, discuss this with your caregiver.   Keep a record of:   Food intake.   Type and amount of exercise.   Expected peak times of insulin action.   Blood glucose levels.  Do this before, during, and after exercise. Review your records with your caregiver. This will help you to develop  guidelines for adjusting food intake and insulin amounts.  TYPE 2 DIABETES AND EXERCISE  Regular physical activity can help control blood glucose.   Exercise is important because it may:   Increase the body's sensitivity to insulin.   Improve blood glucose control.   Exercise reduces the risk of heart disease. It decreases serum cholesterol and triglycerides. It also lowers blood pressure.   Those who take insulin or oral hypoglycemic agents should watch for signs of hypoglycemia. These signs include dizziness, shaking, sweating, chills, and confusion.   Body water is lost during exercise. It must be replaced. This will help to avoid loss of body fluids (dehydration) or heat stroke.  Be sure to talk to your caregiver before starting an exercise program to make sure it is safe for you. Remember, any activity is better than none.  Document Released: 01/08/2004 Document Revised: 10/07/2011 Document Reviewed: 04/24/2009 [facility] Patient Information 2012 [place].

## 2012-02-26 LAB — MICROALBUMIN / CREATININE URINE RATIO
Creatinine, Urine: 180.5 mg/dL
Microalb, Ur: 5.94 mg/dL — ABNORMAL HIGH (ref 0.00–1.89)

## 2012-02-28 NOTE — Assessment & Plan Note (Signed)
Elevated today. Discussed low salt diet. Will check Cr and K and adjust medications pending labs.

## 2012-02-28 NOTE — Assessment & Plan Note (Signed)
Checking Cr and K today. Is plugged in with [facility]. Has annual visits.

## 2012-02-28 NOTE — Assessment & Plan Note (Signed)
Surgical repair pending surgical clearance.

## 2012-02-28 NOTE — Assessment & Plan Note (Signed)
Will transition off of glipizide as pt has had some episodes of symptomatic hypoglycemia with this this. Will increase lantus to adjust for this.

## 2012-03-01 ENCOUNTER — Ambulatory Visit (INDEPENDENT_AMBULATORY_CARE_PROVIDER_SITE_OTHER): Payer: PRIVATE HEALTH INSURANCE | Admitting: Family Medicine

## 2012-03-01 ENCOUNTER — Encounter: Payer: Self-pay | Admitting: Family Medicine

## 2012-03-01 DIAGNOSIS — M109 Gout, unspecified: Secondary | ICD-10-CM

## 2012-03-01 DIAGNOSIS — E119 Type 2 diabetes mellitus without complications: Secondary | ICD-10-CM

## 2012-03-01 DIAGNOSIS — K439 Ventral hernia without obstruction or gangrene: Secondary | ICD-10-CM

## 2012-03-01 DIAGNOSIS — I10 Essential (primary) hypertension: Secondary | ICD-10-CM

## 2012-03-01 MED ORDER — ROSUVASTATIN CALCIUM 40 MG PO TABS: 40.0000 mg | ORAL_TABLET | Freq: Every day | ORAL | Status: DC

## 2012-03-01 MED ORDER — ESOMEPRAZOLE MAGNESIUM 40 MG PO CPDR: 40.0000 mg | DELAYED_RELEASE_CAPSULE | Freq: Every day | ORAL | Status: DC

## 2012-03-01 MED ORDER — LOSARTAN POTASSIUM 50 MG PO TABS: 50.0000 mg | ORAL_TABLET | Freq: Every day | ORAL | Status: DC

## 2012-03-01 NOTE — Assessment & Plan Note (Signed)
Pending preoperative clearance for outpatient elective repair by [facility] [place] [facility].

## 2012-03-01 NOTE — Assessment & Plan Note (Signed)
CBGs better control with Lantus 75 units twice a day. We'll continue with this regimen.

## 2012-03-01 NOTE — Patient Instructions (Signed)
Chronic Renal Insufficiency Chronic renal insufficiency (also called kidney failure) occurs when there is kidney damage done. The damage prevents the kidneys from working like they should.  The kidneys do many important things. They:  Filter waste out of the blood.   Regulate the amount of water and various salts in the blood stream.   Produce chemicals that:   Prompt the bone marrow to make red blood cells.   Regulate blood pressure.   Keep calcium in balance throughout the bones and the body.  When the kidneys are damaged, they can no longer filter waste products out of the blood. These substances build up in the blood, causing illness.  CAUSES   Diabetes.   High blood pressure.   Glomerular diseases: Conditions that damage the tiny blood vessels (glomeruli) within the kidneys, such as:   Membranous nephropathy.   IgA nephropathy.   Focal segmental glomerulosclerosis.   Poisons (such as overdoses or misuse of acetaminophen or NSAIDS, or exposure to other toxic substances).   Kidney injuries.   Kidney cancer or cancer that spreads to the kidney.   Medications such as NSAIDs. These problems are rare.   Kidney stones.   Alport disease.   Polycystic kidneys.  SYMPTOMS  Most people do not notice symptoms of kidney failure until their kidney function drops below about 30-40% of normal. Symptoms can include:  Weakness.   Tiredness.   Frequent urination.   Intense need to urinate.   Excess bruising.   Low urine production.   Blood in the urine.   Pain in the kidney area.   Feeling sick to your stomach (nausea).   Vomiting.   Unusual bleeding.   Numbness in hands and feet.   Swelling in legs, arms and face.   Confusion.  DIAGNOSIS  Your caregiver will look for signs of kidney failure. Tests to diagnose kidney failure may include:  Urine tests: May reveal the presence of blood, protein or sugar.   Blood tests: May show low red blood cell count  (anemia) or high levels of waste products (BUN and creatinine) that are normally filtered out of the bloodstream by the kidneys.   Imaging tests - These are tests that create pictures of the organs inside the abdomen, such as the kidneys. They may reveal masses growing in the kidneys or blockages to the flow of urine. Possible imaging tests may include:   Ultrasound.   CT scan.   MRI.   Intravenous pyelogram or IVP. This is a test that involves injecting dye into the bloodstream and then taking a series of x-rays of the kidneys. This allows the kidneys and other parts of the urinary system to be viewed more clearly.   Kidney biopsy - A small sample of kidney is removed using a special needle. The sample is examined for abnormalities under a microscope.  TREATMENT  Chronic kidney failure cannot usually be cured. The various symptoms are treated, and measures are taken to avoid further kidney damage. Treatment for mild to moderate kidney failure may include:  Medication for high blood pressure.   Good control of diabetes.   Medication and diet change to improve anemia.   A low-sodium, low-potassium, low-protein and/or low-cholesterol diet.   Limiting the quantity of liquids in the diet.  Treatment for more severe kidney failure may require:  Dialysis - Mechanical methods of filtering the blood.   Kidney transplant - An operation that removes the diseased kidney and replaces it with a donated kidney.  HOME  CARE INSTRUCTIONS   Take medication as told by your caregiver.   Quit smoking if you are a smoker. Talk to your caregiver about a smoking cessation program.   Follow your prescribed diet.   If you are prescribed vitamins, take them as told.  SEEK IMMEDIATE MEDICAL CARE IF:  You start to produce less urine.   You notice blood in your urine.   You have increased pain.   You have increased weakness, fatigue or confusion.   You notice new swelling.   You develop a  fever.   You feel that you are having side effects of medicines prescribed.  Document Released: 07/27/2008 Document Revised: 10/07/2011 Document Reviewed: 11/09/2010 [facility] Patient Information 2012 [place].

## 2012-03-01 NOTE — Progress Notes (Signed)
S:  Patient presents today for diabetic followup. Patient was seen last week it was transitioned from glipizide and Lantus 70 units twice a day to Lantus 75 units twice a day as patient is having symptomatic hypoglycemia was glipizide. Patient is here today for followup of this.  Today, patient states her blood sugars have been in the 90s to 110 since his transition was made. Patient denies any episodes of hyper or hypoglycemia. Patient states she's been compliant with taking his Lantus. Otherwise no issues or concerns.   Patient is also pending formal preoperative clearance for an elective umbilical hernia repair. Seemed CT imaging results were discussed at this visit today.   O:  Current Outpatient Prescriptions  Medication Sig Dispense Refill  . amitriptyline (ELAVIL) 25 MG tablet Take 3 tablets by mouth every night       . aspirin (BAYER CHILDRENS ASPIRIN) 81 MG chewable tablet Chew 81 mg by mouth daily.        . cilostazol (PLETAL) 100 MG tablet TAKE ONE TABLET TWICE DAILY  60 tablet  6  . esomeprazole (NEXIUM) 40 MG capsule Take 1 capsule (40 mg total) by mouth daily before breakfast.  30 capsule  11  . glipiZIDE (GLUCOTROL) 10 MG tablet Take 2 tablets (20 mg total) by mouth 2 (two) times daily with a meal.  120 tablet  11  . insulin aspart (NOVOLOG) 100 UNIT/ML injection Inject 5 units daily 15-30 mins before meals.  Dispense 1 month supply.       . insulin glargine (LANTUS) 100 UNIT/ML injection 80 units sub q daily.  50 mL  3  . losartan (COZAAR) 50 MG tablet Take 1 tablet (50 mg total) by mouth daily.  30 tablet  11  . metFORMIN (GLUCOPHAGE) 1000 MG tablet TAKE ONE TABLET TWICE DAILY  60 tablet  11  . metoprolol (LOPRESSOR) 100 MG tablet TAKE ONE TABLET TWICE DAILY  60 tablet  6  . rosuvastatin (CRESTOR) 40 MG tablet Take 1 tablet (40 mg total) by mouth daily.  30 tablet  11  . DISCONTD: CRESTOR 40 MG tablet TAKE 1 TABLET EACH EVENING  30 tablet  11  . DISCONTD: losartan (COZAAR) 50  MG tablet Take 1 tablet (50 mg total) by mouth daily.  30 tablet  11  . DISCONTD: NEXIUM 40 MG capsule TAKE ONE (1) CAPSULE EACH DAY  30 each  11    Wt Readings from Last 3 Encounters:  03/01/12 311 lb (141.069 kg)  02/25/12 315 lb (142.883 kg)  02/15/12 316 lb 12.8 oz (143.7 kg)   Temp Readings from Last 3 Encounters:  02/15/12 97.4 F (36.3 C) Temporal  12/09/11 97.6 F (36.4 C) Temporal  05/12/11 98 F (36.7 C) Oral   BP Readings from Last 3 Encounters:  03/01/12 142/80  02/25/12 163/84  02/15/12 152/79   Pulse Readings from Last 3 Encounters:  02/25/12 54  02/15/12 71  12/09/11 68   General: alert and cooperative  HEENT: PERRLA and extra ocular movement intact  Heart: S1, S2 normal, no murmur, rub or gallop, regular rate and rhythm  Lungs: clear to auscultation, no wheezes or rales and unlabored breathing  Abdomen: obese, non tender, Extremities: extremities normal, atraumatic, no cyanosis or edema  Skin:no rashes, no ecchymoses  Neurology: normal without focal findings; diabetic foot exam with no sensory deficits. Diabetic foot exam within normal limits.    A/P:

## 2012-03-02 ENCOUNTER — Encounter: Payer: Self-pay | Admitting: *Deleted

## 2012-03-03 ENCOUNTER — Ambulatory Visit (INDEPENDENT_AMBULATORY_CARE_PROVIDER_SITE_OTHER): Payer: PRIVATE HEALTH INSURANCE | Admitting: Cardiology

## 2012-03-03 ENCOUNTER — Encounter: Payer: Self-pay | Admitting: Cardiology

## 2012-03-03 VITALS — BP 151/79 | HR 77 | Ht 75.0 in | Wt 315.8 lb

## 2012-03-03 DIAGNOSIS — I739 Peripheral vascular disease, unspecified: Secondary | ICD-10-CM

## 2012-03-03 DIAGNOSIS — I7389 Other specified peripheral vascular diseases: Secondary | ICD-10-CM

## 2012-03-03 DIAGNOSIS — I2510 Atherosclerotic heart disease of native coronary artery without angina pectoris: Secondary | ICD-10-CM

## 2012-03-03 DIAGNOSIS — Z01810 Encounter for preprocedural cardiovascular examination: Secondary | ICD-10-CM

## 2012-03-03 DIAGNOSIS — E785 Hyperlipidemia, unspecified: Secondary | ICD-10-CM

## 2012-03-03 DIAGNOSIS — I10 Essential (primary) hypertension: Secondary | ICD-10-CM

## 2012-03-03 NOTE — Assessment & Plan Note (Addendum)
Plan Myoview for risk stratification. Continue beta blocker.

## 2012-03-03 NOTE — Assessment & Plan Note (Signed)
Blood pressure mildly elevated. However he states typically normal at home. Continue present medications.
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
## 2012-03-03 NOTE — Patient Instructions (Signed)
Your physician wants you to follow-up in: [place] will receive a reminder letter in the mail two months in advance. If you don't receive a letter, please call our office to schedule the follow-up appointment.   Your physician has requested that you have a lexiscan myoview. For further information please visit [email or web address]. Please follow instruction sheet, as given. [place] has requested that you have a lower extremity arterial duplex. During this test, ultrasound are used to evaluate arterial blood flow in the legs. Allow one hour for this exam. There are no restrictions or special instructions.

## 2012-03-03 NOTE — Assessment & Plan Note (Signed)
Continue statin. Lipids and liver monitored by primary care. 

## 2012-03-03 NOTE — Assessment & Plan Note (Addendum)
Continue aspirin and statin. Schedule a  ABIs with Doppler.

## 2012-03-03 NOTE — Assessment & Plan Note (Addendum)
Continue aspirin and statin. Myoview for risk stratification.

## 2012-03-08 ENCOUNTER — Encounter (INDEPENDENT_AMBULATORY_CARE_PROVIDER_SITE_OTHER): Payer: Medicare Other

## 2012-03-08 DIAGNOSIS — I70219 Atherosclerosis of native arteries of extremities with intermittent claudication, unspecified extremity: Secondary | ICD-10-CM

## 2012-03-08 DIAGNOSIS — I739 Peripheral vascular disease, unspecified: Secondary | ICD-10-CM

## 2012-03-10 ENCOUNTER — Other Ambulatory Visit: Payer: Self-pay | Admitting: Family Medicine

## 2012-03-14 ENCOUNTER — Ambulatory Visit (HOSPITAL_COMMUNITY): Payer: Medicare Other | Attending: Cardiology | Admitting: Radiology

## 2012-03-14 VITALS — BP 152/81 | Ht 75.0 in | Wt 315.0 lb

## 2012-03-14 DIAGNOSIS — I2510 Atherosclerotic heart disease of native coronary artery without angina pectoris: Secondary | ICD-10-CM

## 2012-03-14 DIAGNOSIS — R0609 Other forms of dyspnea: Secondary | ICD-10-CM | POA: Insufficient documentation

## 2012-03-14 DIAGNOSIS — E785 Hyperlipidemia, unspecified: Secondary | ICD-10-CM | POA: Insufficient documentation

## 2012-03-14 DIAGNOSIS — R0989 Other specified symptoms and signs involving the circulatory and respiratory systems: Secondary | ICD-10-CM | POA: Insufficient documentation

## 2012-03-14 DIAGNOSIS — R5381 Other malaise: Secondary | ICD-10-CM | POA: Insufficient documentation

## 2012-03-14 DIAGNOSIS — Z01810 Encounter for preprocedural cardiovascular examination: Secondary | ICD-10-CM | POA: Insufficient documentation

## 2012-03-14 DIAGNOSIS — Z87891 Personal history of nicotine dependence: Secondary | ICD-10-CM | POA: Insufficient documentation

## 2012-03-14 DIAGNOSIS — R5383 Other fatigue: Secondary | ICD-10-CM | POA: Insufficient documentation

## 2012-03-14 DIAGNOSIS — R0602 Shortness of breath: Secondary | ICD-10-CM

## 2012-03-14 DIAGNOSIS — Z8249 Family history of ischemic heart disease and other diseases of the circulatory system: Secondary | ICD-10-CM | POA: Insufficient documentation

## 2012-03-14 DIAGNOSIS — Z794 Long term (current) use of insulin: Secondary | ICD-10-CM | POA: Insufficient documentation

## 2012-03-14 DIAGNOSIS — I10 Essential (primary) hypertension: Secondary | ICD-10-CM | POA: Insufficient documentation

## 2012-03-14 DIAGNOSIS — I739 Peripheral vascular disease, unspecified: Secondary | ICD-10-CM | POA: Insufficient documentation

## 2012-03-14 DIAGNOSIS — E119 Type 2 diabetes mellitus without complications: Secondary | ICD-10-CM | POA: Insufficient documentation

## 2012-03-14 DIAGNOSIS — I252 Old myocardial infarction: Secondary | ICD-10-CM | POA: Insufficient documentation

## 2012-03-14 MED ORDER — TECHNETIUM TC 99M TETROFOSMIN IV KIT
30.0000 | PACK | Freq: Once | INTRAVENOUS | Status: AC | PRN
  Administered 2012-03-14: 30 via INTRAVENOUS

## 2012-03-14 MED ORDER — REGADENOSON 0.4 MG/5ML IV SOLN
0.4000 mg | Freq: Once | INTRAVENOUS | Status: AC
  Administered 2012-03-14: 0.4 mg via INTRAVENOUS

## 2012-03-14 NOTE — Progress Notes (Signed)
[name] [place] [address] [phone]  Cardiology Nuclear Med Study  Culver Drain is a 66 y.o. male     MRN : [id number]     DOB: 1946-04-22  Procedure Date: 03/14/2012  Nuclear Med Background Indication for Stress Test:  Evaluation for Ischemia, and Pending Surgical Clearance for Umbilical Hernia repair by Dr. Judieth Keens History:  '01 MI: IWMI-Heart Cath: EF: 55% 100% Thrombus unsuccessful PCI, 99% acute marginal 8'07 Adenosine: Inf. Scar  EF: 66% Cardiac Risk Factors: Claudication, Family History - CAD, History of Smoking, Hypertension, IDDM Type 2, Lipids, Obesity and PVD  Symptoms:  DOE, Fatigue and Fatigue with Exertion   Nuclear Pre-Procedure Caffeine/Decaff Intake:  None> 12 hrs NPO After: 8:30pm   Lungs:  clear O2 Sat: 94% on room air. IV 0.9% NS with Angio Cath:  22g  IV Site: R Hand x 1, tolerated well IV Started by:  Irven Baltimore, RN  Chest Size (in):  56 Cup Size: n/a  Height: 6\' 3"  (1.905 m)  Weight:  315 lb (142.883 kg)  BMI:  Body mass index is 39.37 kg/(m^2). Tech Comments:  FBS 160 at 7:00am, no insulin or metformin today, took metoprolol this am    Nuclear Med Study 1 or 2 day study: 2 day  Stress Test Type:  Lexiscan  Reading MD: Dola Argyle, MD  Order Authorizing Provider:  Kirk Ruths, MD  Resting Radionuclide: Technetium 23m Tetrofosmin  Resting Radionuclide Dose: 33.0 mCi on 03/15/12   Stress Radionuclide:  Technetium 82m Tetrofosmin  Stress Radionuclide Dose: 32.5 mCi on 03/14/12           Stress Protocol Rest HR:58 Stress HR: 71  Rest BP: 152/81 Stress BP: 162/77  Exercise Time (min): n/a METS: n/a   Predicted Max HR: 156 bpm % Max HR: 45.51 bpm Rate Pressure Product: 11502   Dose of Adenosine (mg):  n/a Dose of Lexiscan: 0.4 mg  Dose of Atropine (mg): n/a Dose of Dobutamine: n/a mcg/kg/min (at max HR)  Stress Test Technologist: Perrin Maltese, EMT-P  Nuclear  Technologist:  Charlton Amor, CNMT     Rest Procedure:  Myocardial perfusion imaging was performed at rest 45 minutes following the intravenous administration of Technetium 97m Tetrofosmin. Rest ECG: NSR - Normal EKG  Stress Procedure:  The patient received IV Lexiscan 0.4 mg over 15-seconds.  Technetium 104m Tetrofosmin injected at 30-seconds.  There were no significant changes and sob with Lexiscan.  Quantitative spect images were obtained after a 45 minute delay. Stress ECG: No significant change from baseline ECG  QPS Raw Data Images:  Patient motion noted; appropriate software correction applied. Stress Images:  The stress images reveal moderately severe decreased uptake at the base, mid, apical inferior segments. This is a moderate-sized defect. There is also moderate decreased uptake at the apical. There is mild decreased uptake at the base and mid inferior septum. Rest Images:  The rest images reveal moderately severe decreased uptake and a moderate area affecting the base mid and apical inferior wall. Subtraction (SDS):  There is some reversibility at the base of the inferior septum and apical. This suggests some peri-infarct ischemia. Transient Ischemic Dilatation (Normal <1.22):  0.96 Lung/Heart Ratio (Normal <0.45):  0.45  Quantitative Gated Spect Images QGS EDV:  124 ml QGS ESV:  53 ml  Impression Exercise Capacity:  Lexiscan with no exercise. BP Response:  Normal blood pressure response. Clinical Symptoms:  SOB ECG Impression:  No  significant ST segment change suggestive of ischemia. Comparison with Prior Nuclear Study: Please refer to the overall impression below.  Overall Impression:  There are no images for comparison from the study of 2007. In the report from the study of 2007, there is evidence of an old inferior infarct. There is no mention of ischemia. The current study also shows evidence of an old inferior infarct. There is suggestion of some peri-infarct  ischemia.  LV Ejection Fraction: 58%.  LV Wall Motion:  There is mild hypokinesis of the inferior wall. However overall LV function is good.  Dola Argyle. MD

## 2012-03-15 ENCOUNTER — Ambulatory Visit (HOSPITAL_COMMUNITY): Payer: Medicare Other | Attending: Cardiology | Admitting: Radiology

## 2012-03-15 DIAGNOSIS — R0989 Other specified symptoms and signs involving the circulatory and respiratory systems: Secondary | ICD-10-CM

## 2012-03-15 MED ORDER — TECHNETIUM TC 99M TETROFOSMIN IV KIT
33.0000 | PACK | Freq: Once | INTRAVENOUS | Status: AC | PRN
  Administered 2012-03-15: 33 via INTRAVENOUS

## 2012-03-16 ENCOUNTER — Encounter: Payer: Self-pay | Admitting: *Deleted

## 2012-03-16 ENCOUNTER — Telehealth: Payer: Self-pay | Admitting: Cardiology

## 2012-03-16 NOTE — Telephone Encounter (Signed)
clearance note faxed to [phone]

## 2012-03-16 NOTE — Telephone Encounter (Signed)
New Problem:    Patient called in to say that Dr. Judieth Keens [facility]) [phone] was his surgeon.  Please call back if you have any questions.

## 2012-03-20 ENCOUNTER — Other Ambulatory Visit (INDEPENDENT_AMBULATORY_CARE_PROVIDER_SITE_OTHER): Payer: Self-pay | Admitting: General Surgery

## 2012-03-20 DIAGNOSIS — K432 Incisional hernia without obstruction or gangrene: Secondary | ICD-10-CM

## 2012-04-03 ENCOUNTER — Ambulatory Visit: Payer: PRIVATE HEALTH INSURANCE | Admitting: Family Medicine

## 2012-04-12 ENCOUNTER — Encounter: Payer: Self-pay | Admitting: Cardiovascular Disease

## 2012-04-12 ENCOUNTER — Ambulatory Visit: Payer: Medicare Other | Admitting: Family Medicine

## 2012-04-12 ENCOUNTER — Ambulatory Visit (INDEPENDENT_AMBULATORY_CARE_PROVIDER_SITE_OTHER): Payer: PRIVATE HEALTH INSURANCE | Admitting: Cardiovascular Disease

## 2012-04-12 ENCOUNTER — Telehealth: Payer: Self-pay | Admitting: *Deleted

## 2012-04-12 VITALS — BP 150/84 | HR 68 | Ht 75.0 in | Wt 317.8 lb

## 2012-04-12 DIAGNOSIS — I7389 Other specified peripheral vascular diseases: Secondary | ICD-10-CM

## 2012-04-12 NOTE — Telephone Encounter (Signed)
Blanch Media at [employer] calling to request a verbal order for diabetic testing supplies.  Patient checks CBGs TID and will need 90 day supply---#300 strips and lancets along with #1 lancing device twice a year.  Please use reference # [id number] when calling back.  Will route request to Dr. Ernestina Patches.  Nolene Ebbs, RN

## 2012-04-12 NOTE — Assessment & Plan Note (Signed)
The patient has known history of peripheral arterial disease and has been on Pletal since at least 2008. That was prescribed by Dr. early who performed his aortic aneurysm surgery. He had a recent ABI which was moderately reduced bilaterally at 0.6 with evidence of chronic occlusion of the right SFA with significant popliteal disease as well as significant left SFA stenosis. The patient's symptoms has not progressed over the last few years and he has been stable. He does not seem to be particularly bothered by his claudications and seems to have other health issues and conditions. He is going to undergo a hernia surgery in the near future. I recommend continuing medical therapy at this point and I also advised him to start a walking exercise program. He is on good medications for PAD. He is not a smoker. If his symptoms worsen in the future, angiography would be considered. Given his previous aortic surgery with bypass to both external iliac arteries, a CTA will need to be performed prior to planning any interventional procedure on his lower extremities. He will likely also require access from the arm.

## 2012-04-12 NOTE — Patient Instructions (Addendum)
Your physician wants you to follow-up in: 4 months. You will receive a reminder letter in the mail two months in advance. If you don't receive a letter, please call our office to schedule the follow-up appointment.  
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
## 2012-04-20 NOTE — Telephone Encounter (Signed)
Verbal order given  

## 2012-04-24 ENCOUNTER — Ambulatory Visit (INDEPENDENT_AMBULATORY_CARE_PROVIDER_SITE_OTHER): Payer: PRIVATE HEALTH INSURANCE | Admitting: Family Medicine

## 2012-04-24 ENCOUNTER — Encounter: Payer: Self-pay | Admitting: Family Medicine

## 2012-04-24 VITALS — BP 150/82 | HR 60 | Ht 75.0 in | Wt 318.0 lb

## 2012-04-24 DIAGNOSIS — N183 Chronic kidney disease, stage 3 unspecified: Secondary | ICD-10-CM

## 2012-04-24 DIAGNOSIS — K439 Ventral hernia without obstruction or gangrene: Secondary | ICD-10-CM

## 2012-04-24 DIAGNOSIS — I10 Essential (primary) hypertension: Secondary | ICD-10-CM

## 2012-04-24 DIAGNOSIS — E119 Type 2 diabetes mellitus without complications: Secondary | ICD-10-CM

## 2012-04-24 LAB — BASIC METABOLIC PANEL
BUN: 12 mg/dL (ref 6–23)
Calcium: 9.6 mg/dL (ref 8.4–10.5)
Chloride: 103 mEq/L (ref 96–112)
Creat: 1.22 mg/dL (ref 0.50–1.35)

## 2012-04-24 MED ORDER — TORSEMIDE 20 MG PO TABS: 10.0000 mg | ORAL_TABLET | Freq: Every day | ORAL | Status: DC

## 2012-04-24 NOTE — Progress Notes (Signed)
Subjective:  Pt is here for chronic problem follow up  HTN:  Checking BPS Daily ?:yes BP Range:140s-160s per pt Any HA, CP, SOB?:no-has had some LE edema  Any Medication Side Effects:no Medication Compliance:yes Salt intake?:high per pt  Exercise?:no Weight Loss?:no  DM: Checking CBGs?:yes How Often?:bid Blood Sugar Range:100s-130s Polyuria, polydypsia, polyphagia:no Symptomatic Hypoglycemia:no Medication Compliance?:yes ACE if hypertensive/obesity?:ARB Statin if LDL >100?:yes Pt is currently taking metformin in setting of stage III CKD       Review of Systems - Negative except as noted above in HPI  Objective:  Current Outpatient Prescriptions  Medication Sig Dispense Refill  . alprostadil (MUSE) 1000 MCG pellet 500 mcg by Transurethral route as needed. Trimix 10 ml with 30 mg papaverine, 0.5 mg regitine and 20 mcg prostin/ml inject .use no more than 3 times per week      . aspirin (BAYER CHILDRENS ASPIRIN) 81 MG chewable tablet Chew 81 mg by mouth daily.        . cilostazol (PLETAL) 100 MG tablet TAKE ONE TABLET TWICE DAILY  60 tablet  6  . esomeprazole (NEXIUM) 40 MG capsule Take 1 capsule (40 mg total) by mouth daily before breakfast.  30 capsule  11  . insulin aspart (NOVOLOG) 100 UNIT/ML injection Inject 5 units daily 15-30 mins before meals.  Dispense 1 month supply.      Marland Kitchen LANTUS 100 UNIT/ML injection INJECT 80 UNITS          SUBCUTANEOUSLY TWICE     DAILY  50 mL  3  . losartan (COZAAR) 50 MG tablet Take 1 tablet (50 mg total) by mouth daily.  30 tablet  11  . metoprolol (LOPRESSOR) 100 MG tablet TAKE ONE TABLET TWICE DAILY  60 tablet  6  . NON FORMULARY Insulin syringe.      . rosuvastatin (CRESTOR) 40 MG tablet Take 1 tablet (40 mg total) by mouth daily.  30 tablet  11    Wt Readings from Last 3 Encounters:  04/24/12 318 lb (144.244 kg)  04/12/12 317 lb 12.8 oz (144.153 kg)  03/14/12 315 lb (142.883 kg)   Temp Readings from Last 3 Encounters:  02/15/12  97.4 F (36.3 C) Temporal  12/09/11 97.6 F (36.4 C) Temporal  05/12/11 98 F (36.7 C) Oral   BP Readings from Last 3 Encounters:  04/24/12 150/82  04/12/12 150/84  03/14/12 152/81   Pulse Readings from Last 3 Encounters:  04/24/12 60  04/12/12 68  03/03/12 77     General: alert, cooperative and moderately obese HEENT: PERRLA and extra ocular movement intact Heart: S1, S2 normal, no murmur, rub or gallop, regular rate and rhythm Lungs: clear to auscultation, no wheezes or rales and unlabored breathing Abdomen: large abdomen, non tender, + ventral hernia  Extremities: 1-2+ pitting edema  Skin:no rashes Neurology: normal without focal findings   Assessment/Plan:

## 2012-04-24 NOTE — Assessment & Plan Note (Signed)
Will d/c metformin. Will adjust lantus for this: 85 units BID to 90 units BID.  Due for A1C late 05/2012-early 06/2012

## 2012-04-24 NOTE — Assessment & Plan Note (Signed)
Elevated today.  Starting on torsemide to help with fluid component. Likely from sodium intake and body habitus.  Pt with noted cardiac myoview 03/2012 w/ EF 58% which is reassuring.  Checking BUN and Cr today.

## 2012-04-24 NOTE — Patient Instructions (Signed)
It was good to see you today I am starting you on torsemide for you swelling.  Stop the metformin.  Increase your lantus to 90 units twice daily  Your next A1C is due at the end of July/beginning of August  Call if any questions.  God Bless, Shanda Howells MD

## 2012-04-24 NOTE — Assessment & Plan Note (Signed)
Checking BUN and Cr.  Metformin d/c'd Starting on torsemide.

## 2012-04-27 ENCOUNTER — Telehealth: Payer: Self-pay | Admitting: Family Medicine

## 2012-04-27 NOTE — Telephone Encounter (Signed)
Returned call to patient.  Had office visit on 04/24/12 and metformin was d/c'd.  Has been off of glipizide for 2-3 months.  Currently on Lantus 90 units BID.  Patient states his CBG's have been high---195-250's fasting and around 250 QHS.  Wants to know if there is something else he can take for his blood sugar that won't damage his kidneys.  Will check with Dr. Ernestina Patches and call patient back.  Nolene Ebbs, RN

## 2012-04-27 NOTE — Telephone Encounter (Signed)
Please inform pt to increase his lantus to 95 units and call back in am about CBGs.  I will be in in the morning and we can follow up on this.  Thank you.

## 2012-04-27 NOTE — Telephone Encounter (Signed)
Pt states that he was taken off of the DM pills and is just supposed to take the insulin.  He states that his sugars are running pretty high now - in the 200's and wants to know what he should do.

## 2012-04-27 NOTE — Telephone Encounter (Signed)
See message.     Thank you!

## 2012-04-27 NOTE — Telephone Encounter (Signed)
Patient was instructed to increase Lantus to 95 units and call back tomorrow morning with CBGs.  Patient states he increased his AM dose of Lantus to 95 units because he "doesn't like the blood sugars running that high."  Informed patient to use 95 units tonight and do not increase dosage unless instructed by Dr. Ernestina Patches.  Patients last CBG today at 4:00pm was 337.  Other CBG readings since d/c'ing metformin are---310, 195, 239, 274, 157, 171, 180, 262, 340, and 268.  Patient verbalized understanding with Dr. Romona Curls dosage change and will call back tomorrow morning with CBG reading.  Nolene Ebbs, RN

## 2012-04-28 ENCOUNTER — Telehealth: Payer: Self-pay | Admitting: Family Medicine

## 2012-04-28 NOTE — Telephone Encounter (Signed)
Will route note to Dr. Ernestina Patches and call patient back.  Nolene Ebbs, RN

## 2012-04-28 NOTE — Telephone Encounter (Signed)
The Blood Sugar reading this morning was 100 and right now it is 220.

## 2012-04-28 NOTE — Telephone Encounter (Signed)
Called patient to get CBG reading for this morning.  Left message to call our office back.  Nolene Ebbs, RN

## 2012-04-28 NOTE — Telephone Encounter (Signed)
Pt may increase lantus to 100 units during the day and 95 units at night.  Return to 95 units twice daily if blood sugars go below 85.  Thank you.

## 2012-05-01 ENCOUNTER — Encounter (HOSPITAL_COMMUNITY): Payer: Self-pay | Admitting: Pharmacy Technician

## 2012-05-08 ENCOUNTER — Encounter (HOSPITAL_COMMUNITY)
Admission: RE | Admit: 2012-05-08 | Discharge: 2012-05-08 | Disposition: A | Discharge status: Home / Self Care | Payer: PRIVATE HEALTH INSURANCE | Source: Ambulatory Visit | Attending: General Surgery | Admitting: General Surgery

## 2012-05-08 ENCOUNTER — Encounter (HOSPITAL_COMMUNITY): Payer: Self-pay

## 2012-05-08 ENCOUNTER — Ambulatory Visit (HOSPITAL_COMMUNITY)
Admission: RE | Admit: 2012-05-08 | Discharge: 2012-05-08 | Disposition: A | Discharge status: Home / Self Care | Payer: PRIVATE HEALTH INSURANCE | Source: Ambulatory Visit | Attending: General Surgery | Admitting: General Surgery

## 2012-05-08 VITALS — BP 150/86 | HR 53 | Temp 97.4°F | Resp 16 | Ht 75.0 in | Wt 317.0 lb

## 2012-05-08 DIAGNOSIS — K469 Unspecified abdominal hernia without obstruction or gangrene: Secondary | ICD-10-CM | POA: Insufficient documentation

## 2012-05-08 DIAGNOSIS — K432 Incisional hernia without obstruction or gangrene: Secondary | ICD-10-CM

## 2012-05-08 DIAGNOSIS — Z01818 Encounter for other preprocedural examination: Secondary | ICD-10-CM | POA: Insufficient documentation

## 2012-05-08 HISTORY — DX: Gastro-esophageal reflux disease without esophagitis: K21.9

## 2012-05-08 LAB — BASIC METABOLIC PANEL
CO2: 25 mEq/L (ref 19–32)
Calcium: 9.4 mg/dL (ref 8.4–10.5)
GFR calc Af Amer: 67 mL/min — ABNORMAL LOW (ref >90)
Sodium: 135 mEq/L (ref 135–145)

## 2012-05-08 LAB — CBC
MCV: 83.5 fL (ref 78.0–100.0)
Platelets: 228 10*3/uL (ref 150–400)
RBC: 5.21 MIL/uL (ref 4.22–5.81)
WBC: 5.7 10*3/uL (ref 4.0–10.5)

## 2012-05-08 LAB — DIFFERENTIAL
Basophils Absolute: 0.1 10*3/uL (ref 0.0–0.1)
Eosinophils Relative: 2 % (ref 0–5)
Lymphocytes Relative: 27 % (ref 12–46)
Lymphs Abs: 1.6 10*3/uL (ref 0.7–4.0)
Neutro Abs: 3.4 10*3/uL (ref 1.7–7.7)
Neutrophils Relative %: 61 % (ref 43–77)

## 2012-05-08 LAB — SURGICAL PCR SCREEN: MRSA, PCR: NEGATIVE

## 2012-05-08 NOTE — Patient Instructions (Addendum)
[place]  05/08/2012   Your procedure is scheduled on:  Friday 05/12/2012 at 1230 pm  Report to [facility] at 1000 AM.  Call this number if you have problems the morning of surgery: [phone]   Remember:  REMEMBER TO TAKE 1/2 OF LANTUS NIGHT BEFORE SURGERY (47.5 UNITS) AND NO DIABETIC MEDICATIONS MORNING OF SURGERY!   Do not eat food:After Midnight.  May have clear liquids:until Midnight .    Take these medicines the morning of surgery with A SIP OF WATER: Nexium, Lopressor, Crestor   Do not wear jewelry  Do not wear lotions, powders, or perfumes.   Do not shave 48 hours prior to surgery. Men may shave face and neck.  Do not bring valuables to the hospital.  Contacts, dentures or bridgework may not be worn into surgery.       Patients discharged the day of surgery will not be allowed to drive home.  Name and phone number of your driver: spouse-[name]  [phone]  Special Instructions: CHG Shower Use Special Wash: 1/2 bottle night before surgery and 1/2 bottle morning of surgery.   Please read over the following fact sheets that you were given: MRSA Information, Sleep apnea sheet, Incentive spirometry sheet                 Any questions, please call me at [place].Jaber Dunlow,RN,BSN

## 2012-05-08 NOTE — Pre-Procedure Instructions (Signed)
Reviewed pre-op instructions with patient using Teach Back method.

## 2012-05-08 NOTE — Pre-Procedure Instructions (Signed)
Anesthesia will see patient am of surgery

## 2012-05-10 ENCOUNTER — Encounter: Payer: Self-pay | Admitting: Family Medicine

## 2012-05-11 ENCOUNTER — Telehealth (INDEPENDENT_AMBULATORY_CARE_PROVIDER_SITE_OTHER): Payer: Self-pay | Admitting: General Surgery

## 2012-05-11 NOTE — Telephone Encounter (Signed)
He is still taking Pletal and scheduled for elective surgery tomorrow.  I called him to reschedule his procedure to 7/16 instead.

## 2012-05-16 ENCOUNTER — Ambulatory Visit (HOSPITAL_COMMUNITY): Payer: PRIVATE HEALTH INSURANCE | Admitting: Anesthesiology

## 2012-05-16 ENCOUNTER — Encounter (HOSPITAL_COMMUNITY)
Admission: RE | Disposition: A | Discharge status: Home / Self Care | Payer: Self-pay | Source: Ambulatory Visit | Attending: General Surgery

## 2012-05-16 ENCOUNTER — Encounter (HOSPITAL_COMMUNITY): Payer: Self-pay | Admitting: Anesthesiology

## 2012-05-16 ENCOUNTER — Inpatient Hospital Stay (HOSPITAL_COMMUNITY)
Admission: RE | Admit: 2012-05-16 | Discharge: 2012-05-24 | DRG: 337 | Disposition: A | Discharge status: Home / Self Care | Payer: PRIVATE HEALTH INSURANCE | Source: Ambulatory Visit | Attending: General Surgery | Admitting: General Surgery

## 2012-05-16 ENCOUNTER — Ambulatory Visit (HOSPITAL_COMMUNITY): Payer: PRIVATE HEALTH INSURANCE

## 2012-05-16 DIAGNOSIS — I739 Peripheral vascular disease, unspecified: Secondary | ICD-10-CM | POA: Diagnosis present

## 2012-05-16 DIAGNOSIS — E669 Obesity, unspecified: Secondary | ICD-10-CM | POA: Diagnosis present

## 2012-05-16 DIAGNOSIS — I252 Old myocardial infarction: Secondary | ICD-10-CM

## 2012-05-16 DIAGNOSIS — Z6838 Body mass index (BMI) 38.0-38.9, adult: Secondary | ICD-10-CM

## 2012-05-16 DIAGNOSIS — I10 Essential (primary) hypertension: Secondary | ICD-10-CM | POA: Diagnosis present

## 2012-05-16 DIAGNOSIS — K432 Incisional hernia without obstruction or gangrene: Secondary | ICD-10-CM

## 2012-05-16 DIAGNOSIS — I70219 Atherosclerosis of native arteries of extremities with intermittent claudication, unspecified extremity: Secondary | ICD-10-CM | POA: Diagnosis present

## 2012-05-16 DIAGNOSIS — K439 Ventral hernia without obstruction or gangrene: Secondary | ICD-10-CM | POA: Diagnosis present

## 2012-05-16 DIAGNOSIS — I2510 Atherosclerotic heart disease of native coronary artery without angina pectoris: Secondary | ICD-10-CM | POA: Diagnosis present

## 2012-05-16 DIAGNOSIS — K660 Peritoneal adhesions (postprocedural) (postinfection): Secondary | ICD-10-CM | POA: Diagnosis present

## 2012-05-16 DIAGNOSIS — E1142 Type 2 diabetes mellitus with diabetic polyneuropathy: Secondary | ICD-10-CM | POA: Diagnosis present

## 2012-05-16 DIAGNOSIS — Z9049 Acquired absence of other specified parts of digestive tract: Secondary | ICD-10-CM

## 2012-05-16 DIAGNOSIS — Z5331 Laparoscopic surgical procedure converted to open procedure: Secondary | ICD-10-CM

## 2012-05-16 DIAGNOSIS — E119 Type 2 diabetes mellitus without complications: Secondary | ICD-10-CM | POA: Diagnosis present

## 2012-05-16 DIAGNOSIS — E1140 Type 2 diabetes mellitus with diabetic neuropathy, unspecified: Secondary | ICD-10-CM | POA: Diagnosis present

## 2012-05-16 DIAGNOSIS — G4733 Obstructive sleep apnea (adult) (pediatric): Secondary | ICD-10-CM | POA: Diagnosis present

## 2012-05-16 DIAGNOSIS — E1149 Type 2 diabetes mellitus with other diabetic neurological complication: Secondary | ICD-10-CM | POA: Diagnosis present

## 2012-05-16 HISTORY — PX: INCISIONAL HERNIA REPAIR: SHX193

## 2012-05-16 LAB — GLUCOSE, CAPILLARY
Glucose-Capillary: 166 mg/dL — ABNORMAL HIGH (ref 70–99)
Glucose-Capillary: 197 mg/dL — ABNORMAL HIGH (ref 70–99)
Glucose-Capillary: 256 mg/dL — ABNORMAL HIGH (ref 70–99)

## 2012-05-16 LAB — BASIC METABOLIC PANEL
CO2: 23 mEq/L (ref 19–32)
Calcium: 8.9 mg/dL (ref 8.4–10.5)
Chloride: 100 mEq/L (ref 96–112)
GFR calc Af Amer: 64 mL/min — ABNORMAL LOW (ref >90)
Sodium: 135 mEq/L (ref 135–145)

## 2012-05-16 LAB — CBC
MCH: 28.2 pg (ref 26.0–34.0)
Platelets: 207 10*3/uL (ref 150–400)
RBC: 5.49 MIL/uL (ref 4.22–5.81)
RDW: 15.7 % — ABNORMAL HIGH (ref 11.5–15.5)
WBC: 14.3 10*3/uL — ABNORMAL HIGH (ref 4.0–10.5)

## 2012-05-16 SURGERY — REPAIR, HERNIA, INCISIONAL
Anesthesia: General | Site: Abdomen | Wound class: Clean

## 2012-05-16 MED ORDER — BUPIVACAINE HCL (PF) 0.25 % IJ SOLN
INTRAMUSCULAR | Status: AC
  Filled 2012-05-16: qty 30

## 2012-05-16 MED ORDER — HYDROMORPHONE HCL PF 1 MG/ML IJ SOLN
INTRAMUSCULAR | Status: AC
  Filled 2012-05-16: qty 1

## 2012-05-16 MED ORDER — CEFAZOLIN SODIUM 1-5 GM-% IV SOLN
INTRAVENOUS | Status: AC
  Filled 2012-05-16: qty 100

## 2012-05-16 MED ORDER — ONDANSETRON HCL 4 MG/2ML IJ SOLN
INTRAMUSCULAR | Status: DC | PRN
  Administered 2012-05-16: 4 mg via INTRAVENOUS

## 2012-05-16 MED ORDER — SODIUM CHLORIDE 0.9 % IJ SOLN: 9.0000 mL | INTRAMUSCULAR | Status: DC | PRN

## 2012-05-16 MED ORDER — DEXAMETHASONE SODIUM PHOSPHATE 4 MG/ML IJ SOLN
INTRAMUSCULAR | Status: DC | PRN
  Administered 2012-05-16: 5 mg via INTRAVENOUS

## 2012-05-16 MED ORDER — DIPHENHYDRAMINE HCL 12.5 MG/5ML PO ELIX
12.5000 mg | ORAL_SOLUTION | Freq: Four times a day (QID) | ORAL | Status: DC | PRN
  Filled 2012-05-16: qty 5

## 2012-05-16 MED ORDER — FENTANYL CITRATE 0.05 MG/ML IJ SOLN
INTRAMUSCULAR | Status: DC | PRN
  Administered 2012-05-16: 50 ug via INTRAVENOUS
  Administered 2012-05-16: 25 ug via INTRAVENOUS
  Administered 2012-05-16: 50 ug via INTRAVENOUS
  Administered 2012-05-16: 25 ug via INTRAVENOUS
  Administered 2012-05-16 (×3): 50 ug via INTRAVENOUS
  Administered 2012-05-16 (×2): 25 ug via INTRAVENOUS

## 2012-05-16 MED ORDER — MIDAZOLAM HCL 5 MG/5ML IJ SOLN
INTRAMUSCULAR | Status: DC | PRN
  Administered 2012-05-16: 2 mg via INTRAVENOUS

## 2012-05-16 MED ORDER — HYDROMORPHONE HCL PF 1 MG/ML IJ SOLN
INTRAMUSCULAR | Status: DC | PRN
  Administered 2012-05-16 (×4): 0.5 mg via INTRAVENOUS

## 2012-05-16 MED ORDER — KETAMINE HCL 10 MG/ML IJ SOLN
INTRAMUSCULAR | Status: DC | PRN
  Administered 2012-05-16: 25 mg via INTRAVENOUS
  Administered 2012-05-16: 2.5 mg via INTRAVENOUS
  Administered 2012-05-16: 2 mg via INTRAVENOUS
  Administered 2012-05-16: 2.5 mg via INTRAVENOUS
  Administered 2012-05-16: 1 mg via INTRAVENOUS
  Administered 2012-05-16: 5 mg via INTRAVENOUS
  Administered 2012-05-16 (×5): 2.5 mg via INTRAVENOUS
  Administered 2012-05-16: 1 mg via INTRAVENOUS
  Administered 2012-05-16: 2.5 mg via INTRAVENOUS

## 2012-05-16 MED ORDER — NEOSTIGMINE METHYLSULFATE 1 MG/ML IJ SOLN
INTRAMUSCULAR | Status: DC | PRN
  Administered 2012-05-16: 5 mg via INTRAVENOUS

## 2012-05-16 MED ORDER — GLYCOPYRROLATE 0.2 MG/ML IJ SOLN
INTRAMUSCULAR | Status: DC | PRN
  Administered 2012-05-16: 0.6 mg via INTRAVENOUS

## 2012-05-16 MED ORDER — DIPHENHYDRAMINE HCL 50 MG/ML IJ SOLN
12.5000 mg | Freq: Four times a day (QID) | INTRAMUSCULAR | Status: DC | PRN
  Administered 2012-05-20: 12.5 mg via INTRAVENOUS
  Filled 2012-05-16: qty 1

## 2012-05-16 MED ORDER — PROPOFOL 10 MG/ML IV EMUL
INTRAVENOUS | Status: DC | PRN
  Administered 2012-05-16: 200 mg via INTRAVENOUS
  Administered 2012-05-16 (×4): 10 mg via INTRAVENOUS
  Administered 2012-05-16: 20 mg via INTRAVENOUS

## 2012-05-16 MED ORDER — ONDANSETRON HCL 4 MG PO TABS: 4.0000 mg | ORAL_TABLET | Freq: Four times a day (QID) | ORAL | Status: DC | PRN

## 2012-05-16 MED ORDER — PHENYLEPHRINE HCL 10 MG/ML IJ SOLN
INTRAMUSCULAR | Status: DC | PRN
  Administered 2012-05-16 (×3): 40 ug via INTRAVENOUS

## 2012-05-16 MED ORDER — 0.9 % SODIUM CHLORIDE (POUR BTL) OPTIME
TOPICAL | Status: DC | PRN
  Administered 2012-05-16: 4000 mL
  Administered 2012-05-16: 1000 mL

## 2012-05-16 MED ORDER — LACTATED RINGERS IV SOLN
INTRAVENOUS | Status: DC | PRN
  Administered 2012-05-16 (×3): via INTRAVENOUS

## 2012-05-16 MED ORDER — CEFAZOLIN SODIUM 1-5 GM-% IV SOLN
INTRAVENOUS | Status: AC
  Filled 2012-05-16: qty 50

## 2012-05-16 MED ORDER — METOPROLOL TARTRATE 50 MG PO TABS
50.0000 mg | ORAL_TABLET | Freq: Two times a day (BID) | ORAL | Status: DC
  Administered 2012-05-16 – 2012-05-24 (×15): 50 mg via ORAL
  Filled 2012-05-16 (×17): qty 1

## 2012-05-16 MED ORDER — METOCLOPRAMIDE HCL 5 MG/ML IJ SOLN
INTRAMUSCULAR | Status: DC | PRN
  Administered 2012-05-16 (×2): 5 mg via INTRAVENOUS

## 2012-05-16 MED ORDER — ACETAMINOPHEN 10 MG/ML IV SOLN
INTRAVENOUS | Status: AC
  Filled 2012-05-16: qty 100

## 2012-05-16 MED ORDER — EPHEDRINE SULFATE 50 MG/ML IJ SOLN
INTRAMUSCULAR | Status: DC | PRN
  Administered 2012-05-16 (×2): 2.5 mg via INTRAVENOUS
  Administered 2012-05-16: 5 mg via INTRAVENOUS
  Administered 2012-05-16: 2.5 mg via INTRAVENOUS

## 2012-05-16 MED ORDER — DEXTROSE 5 % IV SOLN
3.0000 g | INTRAVENOUS | Status: AC
  Administered 2012-05-16: 3 g via INTRAVENOUS
  Filled 2012-05-16: qty 30

## 2012-05-16 MED ORDER — MORPHINE SULFATE (PF) 1 MG/ML IV SOLN
INTRAVENOUS | Status: DC
  Administered 2012-05-16: 4.5 mg via INTRAVENOUS
  Administered 2012-05-16: 1 mg via INTRAVENOUS
  Administered 2012-05-17: 1.6 mg via INTRAVENOUS
  Administered 2012-05-17 (×2): 3 mg via INTRAVENOUS
  Administered 2012-05-17 (×2): 1.5 mg via INTRAVENOUS
  Administered 2012-05-18: 1.27 mg via INTRAVENOUS
  Administered 2012-05-18: 3 mg via INTRAVENOUS
  Administered 2012-05-18: 9 mg via INTRAVENOUS
  Administered 2012-05-18: 1.5 mg via INTRAVENOUS
  Administered 2012-05-18: 03:00:00 via INTRAVENOUS
  Administered 2012-05-18: 6 mg via INTRAVENOUS
  Administered 2012-05-18 – 2012-05-19 (×4): 1.5 mg via INTRAVENOUS
  Administered 2012-05-19: 4 mg via INTRAVENOUS
  Administered 2012-05-19: 06:00:00 via INTRAVENOUS
  Administered 2012-05-19: 4.5 mg via INTRAVENOUS
  Administered 2012-05-20: 14:00:00 via INTRAVENOUS
  Administered 2012-05-20: 3 mg via INTRAVENOUS
  Administered 2012-05-20: 4.5 mg via INTRAVENOUS
  Administered 2012-05-20: 1.5 mg via INTRAVENOUS
  Administered 2012-05-20: 3 mg via INTRAVENOUS
  Administered 2012-05-20: 4.5 mg via INTRAVENOUS
  Administered 2012-05-20: 1.5 mL via INTRAVENOUS
  Administered 2012-05-21: 1.5 mg via INTRAVENOUS
  Administered 2012-05-21: 4.5 mg via INTRAVENOUS
  Administered 2012-05-21: 3 mg via INTRAVENOUS
  Filled 2012-05-16 (×3): qty 25

## 2012-05-16 MED ORDER — INSULIN ASPART 100 UNIT/ML ~~LOC~~ SOLN
0.0000 [IU] | SUBCUTANEOUS | Status: DC
  Administered 2012-05-16: 8 [IU] via SUBCUTANEOUS
  Administered 2012-05-16 – 2012-05-17 (×2): 5 [IU] via SUBCUTANEOUS
  Administered 2012-05-17 (×2): 2 [IU] via SUBCUTANEOUS
  Administered 2012-05-17 – 2012-05-18 (×4): 5 [IU] via SUBCUTANEOUS
  Administered 2012-05-18 (×4): 8 [IU] via SUBCUTANEOUS
  Administered 2012-05-18: 5 [IU] via SUBCUTANEOUS
  Administered 2012-05-18 – 2012-05-20 (×7): 8 [IU] via SUBCUTANEOUS
  Administered 2012-05-20: 5 [IU] via SUBCUTANEOUS
  Administered 2012-05-20 (×3): 8 [IU] via SUBCUTANEOUS
  Administered 2012-05-20: 5 [IU] via SUBCUTANEOUS
  Administered 2012-05-21 (×3): 8 [IU] via SUBCUTANEOUS
  Administered 2012-05-21: 11 [IU] via SUBCUTANEOUS

## 2012-05-16 MED ORDER — ONDANSETRON HCL 4 MG/2ML IJ SOLN: 4.0000 mg | Freq: Four times a day (QID) | INTRAMUSCULAR | Status: DC | PRN

## 2012-05-16 MED ORDER — ENOXAPARIN SODIUM 40 MG/0.4ML ~~LOC~~ SOLN
SUBCUTANEOUS | Status: AC
  Filled 2012-05-16: qty 0.4

## 2012-05-16 MED ORDER — NALOXONE HCL 0.4 MG/ML IJ SOLN: 0.4000 mg | INTRAMUSCULAR | Status: DC | PRN

## 2012-05-16 MED ORDER — LIDOCAINE-EPINEPHRINE 1 %-1:100000 IJ SOLN
INTRAMUSCULAR | Status: AC
  Filled 2012-05-16: qty 1

## 2012-05-16 MED ORDER — LACTATED RINGERS IV SOLN
INTRAVENOUS | Status: DC
  Administered 2012-05-16 (×2): via INTRAVENOUS

## 2012-05-16 MED ORDER — MORPHINE SULFATE (PF) 1 MG/ML IV SOLN
INTRAVENOUS | Status: AC
  Filled 2012-05-16: qty 25

## 2012-05-16 MED ORDER — HYDROMORPHONE HCL PF 1 MG/ML IJ SOLN
0.2500 mg | INTRAMUSCULAR | Status: DC | PRN
  Administered 2012-05-16 (×2): 0.5 mg via INTRAVENOUS

## 2012-05-16 MED ORDER — KETAMINE HCL 10 MG/ML IJ SOLN: INTRAMUSCULAR | Status: DC | PRN

## 2012-05-16 MED ORDER — ROCURONIUM BROMIDE 100 MG/10ML IV SOLN
INTRAVENOUS | Status: DC | PRN
  Administered 2012-05-16: 5 mg via INTRAVENOUS
  Administered 2012-05-16: 20 mg via INTRAVENOUS
  Administered 2012-05-16: 10 mg via INTRAVENOUS
  Administered 2012-05-16: 20 mg via INTRAVENOUS
  Administered 2012-05-16: 10 mg via INTRAVENOUS
  Administered 2012-05-16 (×2): 5 mg via INTRAVENOUS
  Administered 2012-05-16: 50 mg via INTRAVENOUS
  Administered 2012-05-16: 20 mg via INTRAVENOUS

## 2012-05-16 MED ORDER — LOSARTAN POTASSIUM 50 MG PO TABS
50.0000 mg | ORAL_TABLET | Freq: Every day | ORAL | Status: DC
  Administered 2012-05-17 – 2012-05-24 (×8): 50 mg via ORAL
  Filled 2012-05-16 (×9): qty 1

## 2012-05-16 MED ORDER — PHENYLEPHRINE HCL 10 MG/ML IJ SOLN
10.0000 mg | INTRAVENOUS | Status: DC | PRN
  Administered 2012-05-16: 50 ug/min via INTRAVENOUS

## 2012-05-16 MED ORDER — LACTATED RINGERS IV SOLN
INTRAVENOUS | Status: DC | PRN
  Administered 2012-05-16: 13:00:00 via INTRAVENOUS

## 2012-05-16 MED ORDER — CEFAZOLIN SODIUM 1-5 GM-% IV SOLN
1.0000 g | Freq: Three times a day (TID) | INTRAVENOUS | Status: DC
  Administered 2012-05-16 – 2012-05-22 (×16): 1 g via INTRAVENOUS
  Filled 2012-05-16 (×19): qty 50

## 2012-05-16 MED ORDER — ENOXAPARIN SODIUM 30 MG/0.3ML ~~LOC~~ SOLN
30.0000 mg | Freq: Once | SUBCUTANEOUS | Status: AC
  Administered 2012-05-16: 30 mg via SUBCUTANEOUS
  Filled 2012-05-16: qty 0.3

## 2012-05-16 MED ORDER — LIDOCAINE HCL (CARDIAC) 20 MG/ML IV SOLN
INTRAVENOUS | Status: DC | PRN
  Administered 2012-05-16: 30 mg via INTRAVENOUS

## 2012-05-16 MED ORDER — ACETAMINOPHEN 10 MG/ML IV SOLN
INTRAVENOUS | Status: DC | PRN
  Administered 2012-05-16: 1000 mg via INTRAVENOUS

## 2012-05-16 MED ORDER — ENOXAPARIN SODIUM 40 MG/0.4ML ~~LOC~~ SOLN
40.0000 mg | SUBCUTANEOUS | Status: DC
  Administered 2012-05-17 – 2012-05-24 (×8): 40 mg via SUBCUTANEOUS
  Filled 2012-05-16 (×8): qty 0.4

## 2012-05-16 MED ORDER — PROMETHAZINE HCL 25 MG/ML IJ SOLN: 6.2500 mg | INTRAMUSCULAR | Status: DC | PRN

## 2012-05-16 MED ORDER — ENOXAPARIN SODIUM 40 MG/0.4ML ~~LOC~~ SOLN: 30.0000 mg | Freq: Once | SUBCUTANEOUS | Status: DC

## 2012-05-16 MED ORDER — LACTATED RINGERS IV SOLN: INTRAVENOUS | Status: DC

## 2012-05-16 SURGICAL SUPPLY — 71 items
ADH SKN CLS APL DERMABOND .7 (GAUZE/BANDAGES/DRESSINGS) ×2
BLADE HEX COATED 2.75 (ELECTRODE) ×3 IMPLANT
BLADE SURG SZ10 CARB STEEL (BLADE) ×2 IMPLANT
BLADE SURG SZ11 CARB STEEL (BLADE) ×3 IMPLANT
CABLE HIGH FREQUENCY MONO STRZ (ELECTRODE) ×3 IMPLANT
CANISTER SUCTION 2500CC (MISCELLANEOUS) ×3 IMPLANT
CHLORAPREP W/TINT 26ML (MISCELLANEOUS) ×2 IMPLANT
CLOTH BEACON ORANGE TIMEOUT ST (SAFETY) ×3 IMPLANT
DECANTER SPIKE VIAL GLASS SM (MISCELLANEOUS) ×3 IMPLANT
DERMABOND ADVANCED (GAUZE/BANDAGES/DRESSINGS) ×1
DERMABOND ADVANCED .7 DNX12 (GAUZE/BANDAGES/DRESSINGS) ×2 IMPLANT
DEVICE TROCAR PUNCTURE CLOSURE (ENDOMECHANICALS) ×2 IMPLANT
DRAIN CHANNEL 19F RND (DRAIN) ×4 IMPLANT
DRAPE INCISE IOBAN 66X45 STRL (DRAPES) ×5 IMPLANT
DRAPE INCISE IOBAN 85X60 (DRAPES) ×2 IMPLANT
DRAPE LAPAROSCOPIC ABDOMINAL (DRAPES) ×3 IMPLANT
DRAPE WARM FLUID 44X44 (DRAPE) ×2 IMPLANT
DRSG PAD ABDOMINAL 8X10 ST (GAUZE/BANDAGES/DRESSINGS) ×2 IMPLANT
ELECT CAUTERY BLADE 6.4 (BLADE) ×2 IMPLANT
ELECT REM PT RETURN 9FT ADLT (ELECTROSURGICAL) ×3
ELECTRODE REM PT RTRN 9FT ADLT (ELECTROSURGICAL) ×2 IMPLANT
EVACUATOR DRAINAGE 10X20 100CC (DRAIN) ×2 IMPLANT
EVACUATOR SILICONE 100CC (DRAIN) ×6
GLOVE BIO SURGEON STRL SZ7 (GLOVE) ×3 IMPLANT
GLOVE BIOGEL PI IND STRL 7.0 (GLOVE) ×2 IMPLANT
GLOVE BIOGEL PI INDICATOR 7.0 (GLOVE) ×1
GLOVE SURG SS PI 7.5 STRL IVOR (GLOVE) ×10 IMPLANT
GOWN PREVENTION PLUS LG XLONG (DISPOSABLE) ×3 IMPLANT
GOWN STRL NON-REIN LRG LVL3 (GOWN DISPOSABLE) ×3 IMPLANT
GOWN STRL REIN XL XLG (GOWN DISPOSABLE) ×8 IMPLANT
KIT BASIN OR (CUSTOM PROCEDURE TRAY) ×3 IMPLANT
MESH ULTRAPRO 12X12 30X30CM (Mesh General) ×3 IMPLANT
NEEDLE HYPO 22GX1.5 SAFETY (NEEDLE) ×3 IMPLANT
NS IRRIG 1000ML POUR BTL (IV SOLUTION) ×5 IMPLANT
PACK ABDOMINAL WL (CUSTOM PROCEDURE TRAY) ×3 IMPLANT
PEN SKIN MARKING BROAD (MISCELLANEOUS) ×3 IMPLANT
PENCIL BUTTON BLDE SNGL 10FT (ELECTRODE) ×2 IMPLANT
PENCIL BUTTON HOLSTER BLD 10FT (ELECTRODE) ×6 IMPLANT
SCISSORS LAP 5X35 DISP (ENDOMECHANICALS) ×3 IMPLANT
SET IRRIG TUBING LAPAROSCOPIC (IRRIGATION / IRRIGATOR) IMPLANT
SLEEVE SURGEON STRL (DRAPES) ×4 IMPLANT
SOLUTION ANTI FOG 6CC (MISCELLANEOUS) ×3 IMPLANT
SPONGE DRAIN TRACH 4X4 STRL 2S (GAUZE/BANDAGES/DRESSINGS) ×4 IMPLANT
SPONGE GAUZE 4X4 12PLY (GAUZE/BANDAGES/DRESSINGS) ×3 IMPLANT
SPONGE LAP 18X18 X RAY DECT (DISPOSABLE) ×11 IMPLANT
STAPLER VISISTAT 35W (STAPLE) ×3 IMPLANT
STRIP CLOSURE SKIN 1/2X4 (GAUZE/BANDAGES/DRESSINGS) ×3 IMPLANT
SUCTION POOLE TIP (SUCTIONS) ×2 IMPLANT
SUT ETHILON 2 0 PS N (SUTURE) ×4 IMPLANT
SUT MNCRL AB 4-0 PS2 18 (SUTURE) ×3 IMPLANT
SUT PDS AB 1 CTXB1 36 (SUTURE) ×4 IMPLANT
SUT PROLENE 0 CT 1 CR/8 (SUTURE) ×4 IMPLANT
SUT PROLENE 1 (SUTURE) ×4 IMPLANT
SUT PROLENE 2 0 SH DA (SUTURE) IMPLANT
SUT VIC AB 3-0 SH 18 (SUTURE) ×2 IMPLANT
SUT VIC AB 3-0 SH 8-18 (SUTURE) ×2 IMPLANT
SUT VICRYL 3 0 BR 18  UND (SUTURE) ×1
SUT VICRYL 3 0 BR 18 UND (SUTURE) ×1 IMPLANT
SUT VICRYL UND BR 6 0 P 3 (SUTURE) ×2 IMPLANT
SYR BULB IRRIGATION 50ML (SYRINGE) ×2 IMPLANT
SYR CONTROL 10ML LL (SYRINGE) ×3 IMPLANT
TACKER 5MM HERNIA 3.5CML NAB (ENDOMECHANICALS) ×1 IMPLANT
TAPE CLOTH SURG 4X10 WHT LF (GAUZE/BANDAGES/DRESSINGS) ×2 IMPLANT
TOWEL OR 17X26 10 PK STRL BLUE (TOWEL DISPOSABLE) ×6 IMPLANT
TRAY FOLEY CATH 14FRSI W/METER (CATHETERS) ×3 IMPLANT
TRAY LAP CHOLE (CUSTOM PROCEDURE TRAY) ×3 IMPLANT
TROCAR BALLN 12MMX100 BLUNT (TROCAR) IMPLANT
TROCAR BLADELESS OPT 5 75 (ENDOMECHANICALS) ×6 IMPLANT
TROCAR XCEL NON-BLD 11X100MML (ENDOMECHANICALS) ×3 IMPLANT
TUBING INSUFFLATION 10FT LAP (TUBING) ×3 IMPLANT
YANKAUER SUCT BULB TIP 10FT TU (MISCELLANEOUS) ×2 IMPLANT

## 2012-05-16 NOTE — Preoperative (Signed)
Beta Blockers   Reason not to administer Beta Blockers:Not Applicable, took BB this am 

## 2012-05-16 NOTE — Progress Notes (Signed)
Pt states he has had blood in his BM for the last 2 days. He has a Hx of hemorrhoids

## 2012-05-16 NOTE — H&P (Signed)
HPI  Adam [name] is a 66 y.o. male. This patient presents for evaluation of a recurrent incisional hernia. He had a prior bowel resection for perforated bowel followed by an incisional hernia repair with mesh. Subsequently he had a abdominal aortic aneurysm repaired as well through the mesh. He first noticed a bulge after his aneurysm surgery but states that it has been increasing in size and has been causing some symptoms. He states that he has noticed a bulge for while and now has some discomfort after eating which he describes as an "achey" pain. He states the pain didn't last as long as it takes to digest his food and then goes away. He states it is current on his colonoscopy although he does have some constipation which is normal for him and he recently started taking a stool softener. Of note, he does have a history of a small myocardial infarction in 2002. He has seen Dr. Stanford Breed and has undergone preoperative evaluation and has been deemed low risk for surgery.   HPI  Past Medical History   Diagnosis  Date   .  Cancer    .  Diabetes mellitus    .  Hyperlipidemia    .  Hypertension    .  Ventral hernia     Past Surgical History   Procedure  Date   .  Abdominal aortic aneurysm repair  2009   History reviewed. No pertinent family history.  Social History  History   Substance Use Topics   .  Smoking status:  Former Smoker     Quit date:  11/01/1994   .  Smokeless tobacco:  Not on file   .  Alcohol Use:  No    Allergies   Allergen  Reactions   .  Nitroglycerin  Other (See Comments)     Drop in blood pressure, caused dizziness.    Current Outpatient Prescriptions   Medication  Sig  Dispense  Refill   .  amitriptyline (ELAVIL) 25 MG tablet  Take 3 tablets by mouth every night     .  aspirin (BAYER CHILDRENS ASPIRIN) 81 MG chewable tablet  Chew 81 mg by mouth daily.     .  cilostazol (PLETAL) 100 MG tablet  TAKE ONE TABLET TWICE DAILY  60 tablet  6   .  CRESTOR 40 MG tablet   TAKE 1 TABLET EACH EVENING  30 tablet  6   .  glipiZIDE (GLUCOTROL) 10 MG tablet  Take 2 tablets (20 mg total) by mouth 2 (two) times daily with a meal.  120 tablet  11   .  insulin aspart (NOVOLOG) 100 UNIT/ML injection  Inject 5 units daily 15-30 mins before meals. Dispense 1 month supply.     .  insulin glargine (LANTUS) 100 UNIT/ML injection  80 units sub q daily.  50 mL  3   .  losartan (COZAAR) 50 MG tablet  Take 1 tablet (50 mg total) by mouth daily.  30 tablet  11   .  metFORMIN (GLUCOPHAGE) 1000 MG tablet  TAKE ONE TABLET TWICE DAILY  60 tablet  6   .  metoprolol (LOPRESSOR) 100 MG tablet  TAKE ONE TABLET TWICE DAILY  60 tablet  6   .  NEXIUM 40 MG capsule  TAKE ONE (1) CAPSULE EACH DAY  30 each  6   Review of Systems  Review of Systems  All other review of systems negative or noncontributory except as stated in the HPI  Physical Exam  Physical Exam  Physical Exam  Nursing note and vitals reviewed.  Constitutional: he is oriented to person, place, and time. he appears well-developed and well-nourished. No distress.  HENT:  Head: Normocephalic and atraumatic.  Mouth/Throat: No oropharyngeal exudate.  Eyes: Conjunctivae and EOM are normal. Pupils are equal, round, and reactive to light. Right eye exhibits no discharge. Left eye exhibits no discharge. No scleral icterus.  Neck: Normal range of motion. Neck supple. No tracheal deviation present.  Cardiovascular: Normal rate, regular rhythm, normal heart sounds and intact distal pulses.  Pulmonary/Chest: Effort normal and breath sounds normal. No stridor. No respiratory distress. he has no wheezes.  Abdominal: Soft. Bowel sounds are normal. he exhibits no distension and no mass. There is no tenderness. There is no rebound and no guarding. WHSS in midline with moderate size ventral hernia which is reducible.  Musculoskeletal: Normal range of motion. he exhibits no edema and no tenderness.  Neurological: he is alert and oriented to  person, place, and time.  Skin: Skin is warm and dry. No rash noted. he is not diaphoretic. No erythema. No pallor.  Psychiatric: he has a normal mood and affect. behavior is normal. Judgment and thought content normal.  Data Reviewed  Assessment  Recurrent incisional hernia  He does have a reducible recurrent incisional hernia on exam and this is increasing in size and becoming more symptomatic for him. He has pain daily and he would like to have this repaired. We discussed the surgical options such as laparoscopic repair versus open repair. We will place a camera initially but if the adhesions are dense as his prior surgeon noted, we will have a high likelihood of open hernia repair and we again discussed this today.  We also discussed the risks of infection, bleeding, pain, scarring, recurrence, bowel injury, persistent pain or bulge, and he expressed understanding and desires to proceed with ventral hernia repair with mesh.

## 2012-05-16 NOTE — Anesthesia Preprocedure Evaluation (Addendum)
Anesthesia Evaluation  Patient identified by MRN, date of birth, ID band Patient awake    Reviewed: Allergy & Precautions, H&P , NPO status , Patient's Chart, lab work & pertinent test results  Airway Mallampati: II TM Distance: >3 FB Neck ROM: Full    Dental  (+) Edentulous Upper and Dental Advisory Given   Pulmonary shortness of breath and with exertion, sleep apnea , former smoker,  breath sounds clear to auscultation  Pulmonary exam normal       Cardiovascular Exercise Tolerance: Poor hypertension, + CAD, + Past MI and + Peripheral Vascular Disease Rhythm:Regular Rate:Normal     Neuro/Psych Neuropathy negative neurological ROS  negative psych ROS   GI/Hepatic Neg liver ROS, GERD-  ,  Endo/Other  Poorly Controlled, Type 2, Insulin DependentMorbid obesity  Renal/GU Renal InsufficiencyRenal diseasenegative Renal ROS  negative genitourinary   Musculoskeletal negative musculoskeletal ROS (+)   Abdominal (+) + obese,   Peds  Hematology negative hematology ROS (+)   Anesthesia Other Findings   Reproductive/Obstetrics negative OB ROS                          Anesthesia Physical Anesthesia Plan  ASA: III  Anesthesia Plan: General   Post-op Pain Management:    Induction: Intravenous  Airway Management Planned: Oral ETT  Additional Equipment:   Intra-op Plan:   Post-operative Plan: Extubation in OR  Informed Consent: I have reviewed the patients History and Physical, chart, labs and discussed the procedure including the risks, benefits and alternatives for the proposed anesthesia with the patient or authorized representative who has indicated his/her understanding and acceptance.   Dental advisory given  Plan Discussed with: CRNA  Anesthesia Plan Comments:         Anesthesia Quick Evaluation

## 2012-05-16 NOTE — Anesthesia Procedure Notes (Signed)
Procedure Name: Intubation Date/Time: 05/16/2012 7:36 AM Performed by: [name] Adam [name] Pre-anesthesia Checklist: Patient identified, Emergency Drugs available, Suction available and Patient being monitored Patient Re-evaluated:Patient Re-evaluated prior to inductionOxygen Delivery Method: Circle system utilized Preoxygenation: Pre-oxygenation with 100% oxygen Intubation Type: IV induction Ventilation: Mask ventilation without difficulty Laryngoscope Size: Miller and 3 Grade View: Grade II Tube type: Oral Tube size: 7.5 mm Number of attempts: 2 Airway Equipment and Method: Stylet Placement Confirmation: ETT inserted through vocal cords under direct vision,  positive ETCO2 and breath sounds checked- equal and bilateral Secured at: 21 cm Tube secured with: Tape Dental Injury: Teeth and Oropharynx as per pre-operative assessment

## 2012-05-16 NOTE — Transfer of Care (Signed)
Immediate Anesthesia Transfer of Care Note  Patient: Adam [name]  Procedure(s) Performed: Procedure(s) (LRB): HERNIA REPAIR INCISIONAL (N/A) INSERTION OF MESH (N/A)  Patient Location: PACU  Anesthesia Type: General  Level of Consciousness: awake, alert , patient cooperative and responds to stimulation  Airway & Oxygen Therapy: Patient Spontanous Breathing and Patient connected to face mask oxygen  Post-op Assessment: Report given to PACU RN, Post -op Vital signs reviewed and stable and Patient moving all extremities  Post vital signs: Reviewed and stable  Complications: No apparent anesthesia complications

## 2012-05-16 NOTE — Anesthesia Postprocedure Evaluation (Signed)
Anesthesia Post Note  Patient: Adam [name]  Procedure(s) Performed: Procedure(s) (LRB): HERNIA REPAIR INCISIONAL (N/A) INSERTION OF MESH (N/A)  Anesthesia type: General  Patient location: PACU  Post pain: Pain level controlled  Post assessment: Post-op Vital signs reviewed  Last Vitals:  Filed Vitals:   05/16/12 1345  BP: 183/96  Pulse: 89  Temp: 36.7 C  Resp: 16    Post vital signs: Reviewed  Level of consciousness: sedated  Complications: No apparent anesthesia complications

## 2012-05-16 NOTE — Brief Op Note (Signed)
05/16/2012  12:52 PM  PATIENT:  Adam [name]  66 y.o. male  PRE-OPERATIVE DIAGNOSIS:  Incisional hernia  POST-OPERATIVE DIAGNOSIS:  Incisional hernia  PROCEDURE:  Procedure(s) (LRB): HERNIA REPAIR INCISIONAL (N/A) INSERTION OF MESH (N/A)  SURGEON:  Surgeon(s) and Role:    * Madilyn Hook, DO - Primary    * Edward Jolly, MD - Assisting    * Odis Hollingshead, MD - Assisting  PHYSICIAN ASSISTANT:   ASSISTANTS: Hoxworth, Rosenbower   ANESTHESIA:   general  EBL:  Total I/O In: 2000 [I.V.:2000] Out: 450 [Urine:350; Blood:100]  BLOOD ADMINISTERED:none  DRAINS: (77F x2) Jackson-Pratt drain(s) with closed bulb suction in the retrorectus space   LOCAL MEDICATIONS USED:  MARCAINE    and LIDOCAINE   SPECIMEN:  No Specimen  DISPOSITION OF SPECIMEN:  N/A  COUNTS:  YES  TOURNIQUET:  * No tourniquets in log *  DICTATION: .Other Dictation: Dictation Number   PLAN OF CARE: Admit to inpatient   PATIENT DISPOSITION:  PACU - hemodynamically stable.   Delay start of Pharmacological VTE agent (>24hrs) due to surgical blood loss or risk of bleeding: no

## 2012-05-17 ENCOUNTER — Encounter (HOSPITAL_COMMUNITY): Payer: Self-pay

## 2012-05-17 LAB — GLUCOSE, CAPILLARY
Glucose-Capillary: 213 mg/dL — ABNORMAL HIGH (ref 70–99)
Glucose-Capillary: 215 mg/dL — ABNORMAL HIGH (ref 70–99)
Glucose-Capillary: 246 mg/dL — ABNORMAL HIGH (ref 70–99)

## 2012-05-17 LAB — CBC
HCT: 43.5 % (ref 39.0–52.0)
Hemoglobin: 14 g/dL (ref 13.0–17.0)
MCH: 27.3 pg (ref 26.0–34.0)
MCHC: 32.2 g/dL (ref 30.0–36.0)
RBC: 5.13 MIL/uL (ref 4.22–5.81)

## 2012-05-17 LAB — MAGNESIUM: Magnesium: 1.5 mg/dL (ref 1.5–2.5)

## 2012-05-17 LAB — BASIC METABOLIC PANEL
CO2: 27 mEq/L (ref 19–32)
Calcium: 8.8 mg/dL (ref 8.4–10.5)
Chloride: 101 mEq/L (ref 96–112)
Creatinine, Ser: 1.23 mg/dL (ref 0.50–1.35)
Glucose, Bld: 159 mg/dL — ABNORMAL HIGH (ref 70–99)

## 2012-05-17 MED ORDER — KCL IN DEXTROSE-NACL 20-5-0.45 MEQ/L-%-% IV SOLN
INTRAVENOUS | Status: DC
  Administered 2012-05-17 – 2012-05-19 (×5): via INTRAVENOUS
  Administered 2012-05-20 – 2012-05-21 (×2): 125 mL/h via INTRAVENOUS
  Administered 2012-05-21: 20 mL/h via INTRAVENOUS
  Administered 2012-05-21: 125 mL/h via INTRAVENOUS
  Filled 2012-05-17 (×19): qty 1000

## 2012-05-17 MED ORDER — PANTOPRAZOLE SODIUM 40 MG IV SOLR
40.0000 mg | INTRAVENOUS | Status: DC
  Administered 2012-05-17 – 2012-05-21 (×5): 40 mg via INTRAVENOUS
  Filled 2012-05-17 (×5): qty 40

## 2012-05-17 MED ORDER — PHENOL 1.4 % MT LIQD
1.0000 | OROMUCOSAL | Status: DC | PRN
  Filled 2012-05-17: qty 177

## 2012-05-17 NOTE — Progress Notes (Signed)
1 Day Post-Op  Subjective: He looks good.  C/o sore throat.  Objective: Vital signs in last 24 hours: Temp:  [97.7 F (36.5 C)-99.1 F (37.3 C)] 97.9 F (36.6 C) (07/16 2000) Pulse Rate:  [73-101] 77  (07/17 0500) Resp:  [12-22] 22  (07/17 0500) BP: (133-203)/(74-130) 138/77 mmHg (07/17 0500) SpO2:  [88 %-98 %] 94 % (07/17 0500) Last BM Date: 05/15/12  Intake/Output from previous day: 07/16 0701 - 07/17 0700 In: 4495 [I.V.:4325; IV Piggyback:50] Out: 2055 [Urine:1700; Drains:255; Blood:100] Intake/Output this shift: Total I/O In: 1375 [I.V.:1375] Out: 1430 [Urine:1200; Drains:230]  General appearance: alert, cooperative and no distress Resp: clear to auscultation bilaterally Cardio: normal rate, regular rhythm GI: dressing clean and dry, other incisions look good, ND, no sign of infection, appropriate tenderness. Extremities: SCD's  Lab Results:   Basename 05/17/12 0311 05/16/12 1341  WBC 9.1 14.3*  HGB 14.0 15.5  HCT 43.5 45.8  PLT 215 207   BMET  Basename 05/17/12 0311 05/16/12 1341  NA 138 135  K 4.0 4.4  CL 101 100  CO2 27 23  GLUCOSE 159* 203*  BUN 16 21  CREATININE 1.23 1.32  CALCIUM 8.8 8.9   PT/INR No results found for this basename: LABPROT:2,INR:2 in the last 72 hours ABG No results found for this basename: PHART:2,PCO2:2,PO2:2,HCO3:2 in the last 72 hours  Studies/Results: Dg Chest Portable 1 View  05/16/2012  *RADIOLOGY REPORT*  Clinical Data: NG tube placement.  PORTABLE CHEST - 1 VIEW  Comparison: 05/08/2012  Findings: NG tube has been placed and is seen entering the stomach. There is cardiomegaly with vascular congestion and probable mild interstitial edema.  Bibasilar opacities, likely atelectasis.  IMPRESSION: NG tube in place, visualized entering the stomach.  Tip is not visualized on this chest x-ray.  Cardiomegaly with mild pulmonary edema and bibasilar atelectasis.  Original Report Authenticated By: Raelyn Number, M.D.   Dg Abd Portable  1v  05/16/2012  *RADIOLOGY REPORT*  Clinical Data: NG tube placement. Incisional hernia.  PORTABLE ABDOMEN - 1 VIEW  Comparison: Scout image for CT scan dated 12/13/2011  Findings: Tip of the NG tube is in the fundus of the stomach.  The side hole is in the distal esophagus.  There is air in the nondistended transverse colon. Skin closure staples are seen in the midline of the abdomen.  There appear to be surgical drains in both sides of the abdomen.  IMPRESSION: NG tube tip is in the fundus of the stomach.  Side hole is in the distal esophagus.  Original Report Authenticated By: Larey Seat, M.D.    Anti-infectives: Anti-infectives     Start     Dose/Rate Route Frequency Ordered Stop   05/16/12 1600   ceFAZolin (ANCEF) IVPB 1 g/50 mL premix        1 g 100 mL/hr over 30 Minutes Intravenous Every 8 hours 05/16/12 1341     05/16/12 0556   ceFAZolin (ANCEF) 3 g in dextrose 5 % 50 mL IVPB        3 g 160 mL/hr over 30 Minutes Intravenous 60 min pre-op 05/16/12 0556 05/16/12 0753          Assessment/Plan: s/p Procedure(s) (LRB): HERNIA REPAIR INCISIONAL (N/A) INSERTION OF MESH (N/A) he looks good and can probably transfer to tele.  I will leave NG as I would expect some delay of bowel function after extensive adhesiolysis, He is limited with mobility and requested that the foley be left in place for  another day due to pain with movement.   LOS: 1 day    Madilyn Hook DAVID 05/17/2012

## 2012-05-17 NOTE — Op Note (Signed)
NAMEMOSE, MABE NO.:  [id number]  MEDICAL RECORD NO.:  [id number]  LOCATION:  [id number]                         FACILITY:  [facility]  PHYSICIAN:  Madilyn Hook, MD       DATE OF BIRTH:  Dec 06, 1945  DATE OF PROCEDURE:  05/16/2012 DATE OF DISCHARGE:                              OPERATIVE REPORT   PROCEDURE:  Open repair of recurrent incisional hernia.  PREOPERATIVE DIAGNOSIS:  Recurrent incisional hernia.  POSTOPERATIVE DIAGNOSIS:  Recurrent incisional hernia.  SURGEON:  Madilyn Hook, MD  ASSISTANTS:  Darene Lamer. Hoxworth, MD  and Odis Hollingshead, MD  ANESTHESIA:  General endotracheal anesthesia.  FLUIDS:  2600 mL of crystalloid.  ESTIMATED BLOOD LOSS:  100 mL.  URINE OUTPUT:  350 mL.  DRAINS:  19-French Blake drains x2 with the left-sided drain coursing inferiorly and around to the contralateral side, and the right-sided drain coursing inferiorly and around the left side of the wound overlying the mesh.  SPECIMENS:  None.  COMPLICATIONS:  None apparent.  FINDINGS:  Very dense adhesions with previous synthetic mesh inside the abdomen with dense adhesions to the bowel.  Adhesiolysis performed with closure of the posterior fascia and retrorectus placement of 30 cm x 30 cm UltraPro mesh and closure of the anterior fascia and scar tissue over the mesh.  INDICATION FOR PROCEDURE:  Mr. Mclane is a 66 year old male, who has had prior vascular surgery and prior hernia repair, who has recurrent incisional hernia and is symptomatic from this hernia, and he requested repair for symptomatic relief.  OPERATIVE DETAILS:  Mr. Heagney was seen and evaluated in the preoperative area, and risks and benefits of procedure were again discussed in lay terms including the risk of conversion to open surgery, bowel injury, chronic pain recurrent and wound infections, and he expressed understanding and informed consent was obtained.  We had planned for laparoscopic  investigation and possible laparoscopic repair, but we again discussed the high likelihood of conversion to open with the patient and he expressed understanding.  He was taken to the operating room, placed on table in a supine position.  Given prophylactic antibiotics and prophylactic Lovenox and general endotracheal anesthesia obtained.  His abdomen was prepped and draped in a standard surgical fashion.  Ioban drape was placed to minimize skin contact.  PROCEDURE:  Procedure time-out was performed with all operative team members confirmed proper patient, procedure, and a small incision was made in the left upper quadrant and dissection carried down through the abdominal wall layers using sharp dissection.  It was very difficult to enter the abdomen due to thick abdominal wall.  We were able to enter sharply and insufflated the abdomen with carbon dioxide gas.  I placed the laparoscope into the abdomen, and it was obvious that he had severe adhesion throughout the abdomen, and we decided at that point to convert to open procedure for the adhesiolysis.  He was also at that time having some issues with ventilation according to the anesthesia monitored; however, the anesthesiologist inspected the system further and found out their anesthesia system no longer had problems and it seemed to be a mechanical issue with the equipment.  I opened his previous midline incision  and carried the dissection down to the abdominal wall fat to the abdominal wall fascia.  We entered the peritoneum, and he had significant adhesions throughout the abdomen.  We worked our way down from cephalad to caudad direction, taking down each of the adhesions under direct visualization in the area of his pain and of the palpable hernia.  We encountered previously placed synthetic mesh, and we divided the mesh but adherent to the undersurface of the mesh, he had several loops of small bowel, which were very tightly  adhered to the undersurface of the mesh, but after a few hours of adhesiolysis, we were able to takedown these loops of bowel from the undersurface of the mesh. Total time spent for the adhesiolysis was approximately 2 hours in duration of direct adhesiolysis taking down adhesions from the abdominal wall, but after the mass adhesions were taken down, the other adhesions to the abdominal wall were taken down laterally and down into the pelvis.  After all adhesions were freed up, I excised the bulk of his prior mesh with some of the mesh was very well ingrown and some of this prior mesh was left as well with the adhesiolysis performed, then irrigated the abdomen and inspected the abdomen for hemostasis which was noted to be adequate, also inspected the bowel which was adhered to the undersurface of the mesh and inspected for any bowel injury, none was identified, but in some of the areas where the bowel was tightly adhered to the abdominal wall.  I did oversew some areas of the serosa, but again there was no evidence of any small bowel injury.  There was no spillage of any enteral contents.  I then created my retrorectus flaps as the peritoneum was too scarred out from the adhesiolysis and from his prior surgeries and also from removal of his other mesh.  I entered the retrorectus space and developed these planes on each side to lateral edge of the rectus and the scar to allow me to carry this and I irrigated the abdomen and the abdomen noted to be hemostatic.  There was no evidence of bowel injury.  We then closed the posterior fascia with #1 PDS suture x2 taking care to avoid injury to underlying bowel contents to cover up this.  His posterior closure was fairly tight, but was able to be closed in its entirety.  At this point, lap and instrument count was correct, and irrigated out the retrorectus space and was noted to be hemostatic and I placed a 30 cm x 30 cm piece of UltraPro mesh in  this space with transfascial sutures placed through the mesh and Parish brought up through the abdominal wall circumferentially using the laparoscopic suture passer.  The sutures were secured, and this appeared to cover widely all the hernia defects and reinforce the abdominal wall.  All the sutures were secured, and the mesh appeared to lie flat and again covered all of the hernia defects and the undersurface of the entire incision with wide overlap.  The sutures were secured, and hemostasis was noted to be adequate.  Then, two 19-French Blake drains were placed through stab incision and placed overlying the mesh in the retrorectus space, and the anterior fascia and prior scar tissue were approximated in the midline over the mesh.  There was some bulging or eversion of the midline incision because the posterior sheath closure was tighter than the anterior sheet closure leaving extra tissue in the midline and given the appearance of eversion  and bulging under the incision and this was present even at the closure but there was no hernia.  The subcutaneous tissue was irrigated, and hemostasis was obtained with Bovie electrocautery, and the skin edges were approximated with skin staples.  The skin was washed and dried, and Dermabond was used to close all of our transfascial suture sites.  The drains were sutured in place with 2-0 nylon sutures and sterile dressings were applied.  Foley catheter was left in place.  An NG tube was placed, and he was stable and ready for transfer to the recovery room in stable condition.          ______________________________ Madilyn Hook, MD     BL/MEDQ  D:  05/16/2012  T:  05/17/2012  Job:  [id number]

## 2012-05-17 NOTE — Progress Notes (Signed)
CARE MANAGEMENT NOTE 05/17/2012  Patient:  Adam [name], Adam [name]   Account Number:  [id number]  Date Initiated:  05/17/2012  Documentation initiated by:  Myles Mallicoat  Subjective/Objective Assessment:   pt with recurrent ventral hernia incisional repair post recent intial repair, recent history of aa repair and cardiac events in sdu overnight for cardiac monitoring.     Action/Plan:   independant at home   Anticipated DC Date:  05/18/2012   Anticipated DC Plan:  HOME/SELF CARE  In-house referral  NA      DC Planning Services  NA      [facility] Choice  NA   Choice offered to / List presented to:  NA   DME arranged  NA      DME agency  NA     [place] arranged  NA      [place] agency  NA   Status of service:  In process, will continue to follow Medicare Important Message given?  NA - LOS <3 / Initial given by admissions (If response is "NO", the following Medicare IM given date fields will be blank) Date Medicare IM given:   Date Additional Medicare IM given:    Discharge Disposition:    Per UR Regulation:  Reviewed for med. necessity/level of care/duration of stay  If discussed at [place] of Stay Meetings, dates discussed:    Comments:  [id number] Velva Harman, RN, BSN, CCM No discharge needs present at time of this review Case Management [phone]

## 2012-05-17 NOTE — Progress Notes (Signed)
Doing okay.  Feels a little better than yesterday.  Vitals okay.  Drains okay as well.  Pain control tonight and will ask PT to evaluate tomorrow.

## 2012-05-18 LAB — CBC WITH DIFFERENTIAL/PLATELET
Basophils Absolute: 0 10*3/uL (ref 0.0–0.1)
Eosinophils Absolute: 0 10*3/uL (ref 0.0–0.7)
HCT: 41.7 % (ref 39.0–52.0)
Lymphocytes Relative: 13 % (ref 12–46)
Lymphs Abs: 1.5 10*3/uL (ref 0.7–4.0)
MCH: 28.1 pg (ref 26.0–34.0)
MCHC: 33.3 g/dL (ref 30.0–36.0)
Monocytes Absolute: 1.3 10*3/uL — ABNORMAL HIGH (ref 0.1–1.0)
Neutro Abs: 8.6 10*3/uL — ABNORMAL HIGH (ref 1.7–7.7)
RDW: 16.2 % — ABNORMAL HIGH (ref 11.5–15.5)

## 2012-05-18 LAB — BASIC METABOLIC PANEL
CO2: 25 mEq/L (ref 19–32)
Calcium: 8.8 mg/dL (ref 8.4–10.5)
Creatinine, Ser: 1.14 mg/dL (ref 0.50–1.35)
GFR calc non Af Amer: 66 mL/min — ABNORMAL LOW (ref >90)
Glucose, Bld: 266 mg/dL — ABNORMAL HIGH (ref 70–99)
Sodium: 134 mEq/L — ABNORMAL LOW (ref 135–145)

## 2012-05-18 LAB — GLUCOSE, CAPILLARY
Glucose-Capillary: 252 mg/dL — ABNORMAL HIGH (ref 70–99)
Glucose-Capillary: 240 mg/dL — ABNORMAL HIGH (ref 70–99)

## 2012-05-18 MED ORDER — VITAMINS A & D EX OINT
TOPICAL_OINTMENT | CUTANEOUS | Status: AC
  Administered 2012-05-18: 22:00:00
  Filled 2012-05-18: qty 5

## 2012-05-18 NOTE — Progress Notes (Signed)
2 Days Post-Op  Subjective: He says that his pain continues to improve.  Ambulated in hall today. Objective: Vital signs in last 24 hours: Temp:  [97.8 F (36.6 C)-100.1 F (37.8 C)] 98.2 F (36.8 C) (07/18 1000) Pulse Rate:  [91-112] 104  (07/18 1000) Resp:  [20-24] 20  (07/18 1000) BP: (137-155)/(81-91) 137/87 mmHg (07/18 1000) SpO2:  [92 %-96 %] 94 % (07/18 1000) FiO2 (%):  [93 %] 93 % (07/18 0800) Last BM Date: 05/16/12  Intake/Output from previous day: 07/17 0701 - 07/18 0700 In: 2530 [I.V.:2500; NG/GT:30] Out: 2167.5 [Urine:1300; Emesis/NG output:725; Drains:142.5] Intake/Output this shift: Total I/O In: 0  Out: 310 [Urine:300; Drains:10]  General appearance: alert, cooperative and no distress Resp: nonlabored Cardio: HR up slightly today, regular GI: soft, appropriate incisional tenderness, ND, dressing changed and wound looks okay, JP's still with bloody fluid Neurologic: Grossly normal  Lab Results:   Basename 05/18/12 0500 05/17/12 0311  WBC 11.4* 9.1  HGB 13.9 14.0  HCT 41.7 43.5  PLT 192 215   BMET  Basename 05/18/12 0441 05/17/12 0311  NA 134* 138  K 4.1 4.0  CL 99 101  CO2 25 27  GLUCOSE 266* 159*  BUN 13 16  CREATININE 1.14 1.23  CALCIUM 8.8 8.8   PT/INR No results found for this basename: LABPROT:2,INR:2 in the last 72 hours ABG No results found for this basename: PHART:2,PCO2:2,PO2:2,HCO3:2 in the last 72 hours  Studies/Results: Dg Chest Portable 1 View  05/16/2012  *RADIOLOGY REPORT*  Clinical Data: NG tube placement.  PORTABLE CHEST - 1 VIEW  Comparison: 05/08/2012  Findings: NG tube has been placed and is seen entering the stomach. There is cardiomegaly with vascular congestion and probable mild interstitial edema.  Bibasilar opacities, likely atelectasis.  IMPRESSION: NG tube in place, visualized entering the stomach.  Tip is not visualized on this chest x-ray.  Cardiomegaly with mild pulmonary edema and bibasilar atelectasis.  Original  Report Authenticated By: Raelyn Number, M.D.   Dg Abd Portable 1v  05/16/2012  *RADIOLOGY REPORT*  Clinical Data: NG tube placement. Incisional hernia.  PORTABLE ABDOMEN - 1 VIEW  Comparison: [facility] image for CT scan dated 12/13/2011  Findings: Tip of the NG tube is in the fundus of the stomach.  The side hole is in the distal esophagus.  There is air in the nondistended transverse colon. Skin closure staples are seen in the midline of the abdomen.  There appear to be surgical drains in both sides of the abdomen.  IMPRESSION: NG tube tip is in the fundus of the stomach.  Side hole is in the distal esophagus.  Original Report Authenticated By: Larey Seat, M.D.    Anti-infectives: Anti-infectives     Start     Dose/Rate Route Frequency Ordered Stop   05/16/12 1600   ceFAZolin (ANCEF) IVPB 1 g/50 mL premix        1 g 100 mL/hr over 30 Minutes Intravenous Every 8 hours 05/16/12 1341     05/16/12 0556   ceFAZolin (ANCEF) 3 g in dextrose 5 % 50 mL IVPB        3 g 160 mL/hr over 30 Minutes Intravenous 60 min pre-op 05/16/12 0556 05/16/12 0753          Assessment/Plan: s/p Procedure(s) (LRB): HERNIA REPAIR INCISIONAL (N/A) INSERTION OF MESH (N/A) d/c foley continue PT.  was planning on removing NG today but has increased bilious output and still no return of bowel function, will leave NG but  remove foley.  continue to mobilize.  HR up slightly but wound looks okay and abdominal exam improving, no peritonitis.  LOS: 2 days    Madilyn Hook DAVID 05/18/2012

## 2012-05-18 NOTE — Evaluation (Signed)
Physical Therapy Evaluation Patient Details Name: Adam [name] MRN: [id number] DOB: 11/19/1945 Today's Date: 05/18/2012 Time: [id number] PT Time Calculation (min): 24 min  PT Assessment / Plan / Recommendation Clinical Impression  66 yo admitted for surgical treatment of recurrent incisional hernia.  He did well with first ambulation attempt.  Recommend he continue to walk with nursing 3-4 times a day with RW and will follow him with PT while on acute.  Do not think he will need follow up PT at d/c, but he may benfir from temporary use of tall wide RW    PT Assessment  Patient needs continued PT services    Follow Up Recommendations  No PT follow up    Barriers to Discharge        Equipment Recommendations  Rolling walker with 5" wheels (wide, tall)    Recommendations for Other Services     Frequency Min 3X/week    Precautions / Restrictions Restrictions Weight Bearing Restrictions: No   Pertinent Vitals/Pain Pt reports pain 7/10 after walking      Mobility  Bed Mobility Bed Mobility: Rolling Left;Left Sidelying to Sit Rolling Left: 3: Mod assist Left Sidelying to Sit: HOB elevated;4: Min assist Details for Bed Mobility Assistance: pt with some pain with moving and lifting upper body up Transfers Transfers: Sit to Stand;Stand to Sit Sit to Stand: 4: Min assist;With upper extremity assist;From elevated surface (pt is tall and large abdomen, so assisted by high bed height) Stand to Sit: 4: Min assist Ambulation/Gait Ambulation/Gait Assistance: 4: Min assist Ambulation Distance (Feet): 200 Feet Assistive device: Rolling walker Gait Pattern: Within Functional Limits Gait velocity: decreased General Gait Details: pt was able to progress with RW well once in upright Stairs: No Wheelchair Mobility Wheelchair Mobility: No    Exercises     PT Diagnosis: Difficulty walking;Generalized weakness;Acute pain  PT Problem List: Pain;Decreased mobility;Decreased activity  tolerance PT Treatment Interventions: DME instruction;Gait training;Functional mobility training   PT Goals Acute Rehab PT Goals PT Goal Formulation: With patient Time For Goal Achievement: 06/01/12 Potential to Achieve Goals: Good Pt will go Supine/Side to Sit: Independently PT Goal: Supine/Side to Sit - Progress: Goal set today Pt will go Sit to Supine/Side: Independently PT Goal: Sit to Supine/Side - Progress: Goal set today Pt will go Sit to Stand: Independently PT Goal: Sit to Stand - Progress: Goal set today Pt will Ambulate: >150 feet;with modified independence;with least restrictive assistive device PT Goal: Ambulate - Progress: Goal set today  Visit Information  Last PT Received On: 05/18/12 Assistance Needed: +1    Subjective Data  Subjective: I had both total knees Patient Stated Goal: To go home    Prior Functioning  Home Living Lives With: Family Available Help at Discharge: Family Type of Home: Apartment Home Access: Level entry Home Layout: One level Home Adaptive Equipment: None Prior Function Level of Independence: Independent Able to Take Stairs?: Yes Communication Communication: No difficulties    Cognition  Overall Cognitive Status: Appears within functional limits for tasks assessed/performed Arousal/Alertness: Awake/alert Orientation Level: Appears intact for tasks assessed Behavior During Session: [facility] for tasks performed    Extremity/Trunk Assessment Right Lower Extremity Assessment RLE ROM/Strength/Tone: Within functional levels Left Lower Extremity Assessment LLE ROM/Strength/Tone: Within functional levels Trunk Assessment Trunk Assessment: Other exceptions Trunk Exceptions: large arnterior post surgical abdomen with drains and abdominal binder (not removed).  pt also with temetry, foley, IV, O2 and NG tube   Balance Balance Balance Assessed: No  End of Session PT -  End of Session Activity Tolerance: Patient tolerated treatment  well Patient left: in chair;with call bell/phone within reach;with family/visitor present Nurse Communication: Mobility status  GP     Donato Heinz. Owens Shark, PT 05/18/2012, 11:12 AM

## 2012-05-18 NOTE — Progress Notes (Signed)
Inpatient Diabetes Program Recommendations  AACE/ADA: New Consensus Statement on Inpatient Glycemic Control (2009)  Target Ranges:  Prepandial:   less than 140 mg/dL      Peak postprandial:   less than 180 mg/dL (1-2 hours)      Critically ill patients:  140 - 180 mg/dL  Results for Adam [name], Adam [name] (MRN [id number]) as of 05/18/2012 13:58  Ref. Range 05/17/2012 16:21 05/17/2012 19:56 05/18/2012 00:21 05/18/2012 04:10 05/18/2012 08:09  Glucose-Capillary Latest Range: 70-99 mg/dL 228 (H) 221 (H) 225 (H) 252 (H) 240 (H)    Inpatient Diabetes Program Recommendations Insulin - Basal: Lantus 50 units (home dose 95 BID per med rec)  Note: CBGs steadily climbing.  Recommend starting a portion of his home dose Lantus.   Will follow.  Thank you  Raoul Pitch [facility] Inpatient Diabetes Coordinator [phone]

## 2012-05-19 ENCOUNTER — Inpatient Hospital Stay (HOSPITAL_COMMUNITY): Payer: PRIVATE HEALTH INSURANCE

## 2012-05-19 LAB — GLUCOSE, CAPILLARY
Glucose-Capillary: 254 mg/dL — ABNORMAL HIGH (ref 70–99)
Glucose-Capillary: 299 mg/dL — ABNORMAL HIGH (ref 70–99)
Glucose-Capillary: 284 mg/dL — ABNORMAL HIGH (ref 70–99)

## 2012-05-19 NOTE — Progress Notes (Signed)
Physical Therapy Treatment Patient Details Name: Pawel Stvil MRN: [id number] DOB: 03-May-1946 Today's Date: 05/19/2012 Time: [id number] PT Time Calculation (min): 40 min  PT Assessment / Plan / Recommendation Comments on Treatment Session  Spouse present during session. Pt just returned from Radiology for ABD X Ray. Had RN clamp NG tube for gait. Pt stated he felt sleepy but very willing. Assisted pt OOB to amb in hallway then positioned in recliner chair. RN reapplied NG tube. Encourage amb with nursing this weekend.    Follow Up Recommendations  No PT follow up    Barriers to Discharge        Equipment Recommendations  Rolling walker with 5" wheels;Other (comment) (wide and tall)    Recommendations for Other Services    Frequency Min 3X/week   Plan Discharge plan remains appropriate    Precautions / Restrictions Precautions Precaution Comments: NPO, NG tube Restrictions Weight Bearing Restrictions: No    Pertinent Vitals/Pain C/o ABD pain 6/10 with act PCA x 2    Mobility  Bed Mobility Bed Mobility: Supine to Sit Rolling Left: 3: Mod assist Details for Bed Mobility Assistance: ABD pain with extra help needed  Transfers Transfers: Sit to Stand;Stand to Sit Sit to Stand: 4: Min guard;From bed;From elevated surface Stand to Sit: To chair/3-in-1;4: Min guard Details for Transfer Assistance: Pt does better elevated surface and admits he uses a lift cahir @ home.  Ambulation/Gait Ambulation/Gait Assistance: 1: +2 Total assist;Other (comment) (+ 2 assist for equipment (IV,O2, chair)) Ambulation/Gait: Patient Percentage: 90% Ambulation Distance (Feet): 225 Feet Assistive device: Rolling walker Ambulation/Gait Assistance Details: 25% VC's with safety reguarding turns using RW Gait Pattern: Step-through pattern Gait velocity: pretty good gait speed     PT Goals              progressing    Visit Information  Last PT Received On: 05/19/12 Assistance Needed: +2  (equipment(IV,O2,chair))    Subjective Data  Subjective: I feel sleepy Patient Stated Goal: home   Cognition    good   Balance   good  End of Session PT - End of Session Equipment Utilized During Treatment: Gait belt;Oxygen (3 lts, ABD binder) Activity Tolerance: Patient tolerated treatment well Patient left: in chair;with call bell/phone within reach;with nursing in room;with family/visitor present Nurse Communication: Mobility status   Rica Koyanagi  PTA [facility]  Acute  Rehab Pager     [phone]

## 2012-05-19 NOTE — Progress Notes (Signed)
3 Days Post-Op  Subjective: Foley out, voiding well.  Pain continues to decrease  Objective: Vital signs in last 24 hours: Temp:  [97.5 F (36.4 C)-98.3 F (36.8 C)] 98.2 F (36.8 C) (07/19 0530) Pulse Rate:  [85-111] 93  (07/19 0530) Resp:  [20-27] 20  (07/19 0626) BP: (136-149)/(85-88) 149/88 mmHg (07/19 0530) SpO2:  [93 %-95 %] 93 % (07/19 0626) FiO2 (%):  [93 %-94 %] 94 % (07/18 1553) Last BM Date: 05/16/12  Intake/Output from previous day: 07/18 0701 - 07/19 0700 In: 3060 [I.V.:3000; NG/GT:60] Out: 2111.5 [Urine:1725; Emesis/NG output:350; Drains:36.5] Intake/Output this shift:    General appearance: alert, cooperative and no distress Resp: nonlabored Cardio: HR improved 92-95 now, regular GI: soft, ND, hypoactive BS, incisional tenderness, wound ok, JP's with dark output but thin Extremities: scd's bilaterally Neurologic: Grossly normal  Lab Results:   Basename 05/18/12 0500 05/17/12 0311  WBC 11.4* 9.1  HGB 13.9 14.0  HCT 41.7 43.5  PLT 192 215   BMET  Basename 05/18/12 0441 05/17/12 0311  NA 134* 138  K 4.1 4.0  CL 99 101  CO2 25 27  GLUCOSE 266* 159*  BUN 13 16  CREATININE 1.14 1.23  CALCIUM 8.8 8.8   PT/INR No results found for this basename: LABPROT:2,INR:2 in the last 72 hours ABG No results found for this basename: PHART:2,PCO2:2,PO2:2,HCO3:2 in the last 72 hours  Studies/Results: No results found.  Anti-infectives: Anti-infectives     Start     Dose/Rate Route Frequency Ordered Stop   05/16/12 1600   ceFAZolin (ANCEF) IVPB 1 g/50 mL premix        1 g 100 mL/hr over 30 Minutes Intravenous Every 8 hours 05/16/12 1341     05/16/12 0556   ceFAZolin (ANCEF) 3 g in dextrose 5 % 50 mL IVPB        3 g 160 mL/hr over 30 Minutes Intravenous 60 min pre-op 05/16/12 0556 05/16/12 0753          Assessment/Plan: s/p Procedure(s) (LRB): HERNIA REPAIR INCISIONAL (N/A) INSERTION OF MESH (N/A) pain continues to improve and wound okay, awaiting  return of bowel function.  He says that he normally has about 2 BM's/day so this is clearly abnormal and hypoactive for him.  awaiting return of bowel function prior to removal of NG.  Depending on xrays, I offered to clamp NG but he is hesitant to do this even.  He seems to be doing okay,  continue to mobilize and ambulate.  LOS: 3 days    Madilyn Hook DAVID 05/19/2012

## 2012-05-20 DIAGNOSIS — I10 Essential (primary) hypertension: Secondary | ICD-10-CM

## 2012-05-20 DIAGNOSIS — E119 Type 2 diabetes mellitus without complications: Secondary | ICD-10-CM

## 2012-05-20 LAB — CBC WITH DIFFERENTIAL/PLATELET
Basophils Absolute: 0 10*3/uL (ref 0.0–0.1)
Basophils Relative: 1 % (ref 0–1)
Eosinophils Absolute: 0.2 10*3/uL (ref 0.0–0.7)
Eosinophils Relative: 3 % (ref 0–5)
MCH: 27.9 pg (ref 26.0–34.0)
MCHC: 33.1 g/dL (ref 30.0–36.0)
MCV: 84.5 fL (ref 78.0–100.0)
Platelets: 203 10*3/uL (ref 150–400)
RDW: 16.7 % — ABNORMAL HIGH (ref 11.5–15.5)

## 2012-05-20 LAB — BASIC METABOLIC PANEL
BUN: 14 mg/dL (ref 6–23)
Calcium: 8.5 mg/dL (ref 8.4–10.5)
GFR calc non Af Amer: 73 mL/min — ABNORMAL LOW (ref >90)
Glucose, Bld: 256 mg/dL — ABNORMAL HIGH (ref 70–99)

## 2012-05-20 LAB — GLUCOSE, CAPILLARY: Glucose-Capillary: 264 mg/dL — ABNORMAL HIGH (ref 70–99)

## 2012-05-20 NOTE — Progress Notes (Signed)
Patient ID: Adam [name], male   DOB: 03-26-1946, 66 y.o.   MRN: [id number] 4 Days Post-Op  Subjective: Continues to void spontaneously.  PCA providing good pain control.  No flatus.    Objective: Vital signs in last 24 hours: Temp:  [98.2 F (36.8 C)-98.9 F (37.2 C)] 98.2 F (36.8 C) (07/20 0433) Pulse Rate:  [81-95] 81  (07/20 0433) Resp:  [20-22] 22  (07/20 0849) BP: (136-153)/(85-90) 149/85 mmHg (07/20 0433) SpO2:  [93 %-97 %] 94 % (07/20 0849) Last BM Date: 05/16/12  Intake/Output from previous day: 06/18/2023 0701 - 07/20 0700 In: 1935 [I.V.:1875; NG/GT:60] Out: 2311 [Urine:1575; Emesis/NG output:700; Drains:36] Intake/Output this shift: Total I/O In: -  Out: 278 [Urine:150; Emesis/NG output:125; Drains:3]  General appearance: alert, cooperative and no distress Resp: nonlabored Cardio: Reg GI: soft, slightly distended, hypoactive BS, incisional tenderness, wound ok, JP's with dark output but thin NGT output bilious Extremities: scd's bilaterally   Lab Results:   Basename 05/20/12 0515 05/18/12 0500  WBC 7.0 11.4*  HGB 12.4* 13.9  HCT 37.5* 41.7  PLT 203 192   BMET  Basename 05/20/12 0515 05/18/12 0441  NA 135 134*  K 3.9 4.1  CL 99 99  CO2 27 25  GLUCOSE 256* 266*  BUN 14 13  CREATININE 1.04 1.14  CALCIUM 8.5 8.8   PT/INR No results found for this basename: LABPROT:2,INR:2 in the last 72 hours ABG No results found for this basename: PHART:2,PCO2:2,PO2:2,HCO3:2 in the last 72 hours  Studies/Results: Dg Abd 2 Views  June 17, 2012  *RADIOLOGY REPORT*  Clinical Data: Postoperative from hernia repair, no bowel movement since surgery, abdominal pain  ABDOMEN - 2 VIEW  Comparison: 05/16/2012  Findings: Nasogastric tube in stomach. Air filled mildly distended loops of small bowel are seen in the upper abdomen with paucity of colonic gas question postoperative ileus. Surgical drains present. Ventral skin clips in abdomen. No definite bowel wall thickening or free  intraperitoneal air. No acute osseous findings or definite urinary tract calcification.  IMPRESSION: Small bowel dilatation, question postoperative ileus.  Original Report Authenticated By: Burnetta Sabin, M.D.    Anti-infectives: Anti-infectives     Start     Dose/Rate Route Frequency Ordered Stop   05/16/12 1600   ceFAZolin (ANCEF) IVPB 1 g/50 mL premix        1 g 100 mL/hr over 30 Minutes Intravenous Every 8 hours 05/16/12 1341     05/16/12 0556   ceFAZolin (ANCEF) 3 g in dextrose 5 % 50 mL IVPB        3 g 160 mL/hr over 30 Minutes Intravenous 60 min pre-op 05/16/12 0556 05/16/12 0753          Assessment/Plan: s/p Procedure(s) (LRB): HERNIA REPAIR INCISIONAL (N/A) INSERTION OF MESH (N/A) Await return of bowel function.  NGT output remains low, but he has bilious output and is sl distended today.  Would leave NGT, remain NPO.   Continue IVF/PCA Ambulate, pulmonary toilet.   Insulin for DM Pletal for vascular disease. Metoprolol/cozaar for HTN  LOS: 4 days    [facility] 05/20/2012

## 2012-05-20 NOTE — Progress Notes (Signed)
Patient ambulated approx. 150 feet, resp 33 and o2 sat 64% on 3Lnc by the end of walk. Sats up to 95 within a couple of minutes with rest and resp 22. Patient reports feeling like he couldn't catch his breath while walking feels better now sitting up in the chair. Will continue to monitor closely.

## 2012-05-20 NOTE — Progress Notes (Signed)
Patient ambulated approx. 200 feet, O2 increased to 6Lnc while walking and pt stopped and rested frequently. O2 sat 79% at the lowest increased to 97% when resting. Patient reports feeling better this time by walking at a slower pace and resting as soon as he starts to feel short of breath. Continuing to monitor closely.

## 2012-05-21 LAB — GLUCOSE, CAPILLARY
Glucose-Capillary: 270 mg/dL — ABNORMAL HIGH (ref 70–99)
Glucose-Capillary: 305 mg/dL — ABNORMAL HIGH (ref 70–99)

## 2012-05-21 MED ORDER — LIP MEDEX EX OINT
1.0000 "application " | TOPICAL_OINTMENT | Freq: Two times a day (BID) | CUTANEOUS | Status: DC
  Administered 2012-05-21 – 2012-05-24 (×7): 1 via TOPICAL
  Filled 2012-05-21: qty 7

## 2012-05-21 MED ORDER — ALBUTEROL SULFATE (5 MG/ML) 0.5% IN NEBU: 2.5000 mg | INHALATION_SOLUTION | Freq: Four times a day (QID) | RESPIRATORY_TRACT | Status: DC | PRN

## 2012-05-21 MED ORDER — FUROSEMIDE 10 MG/ML IJ SOLN
40.0000 mg | Freq: Once | INTRAMUSCULAR | Status: AC
  Administered 2012-05-21: 40 mg via INTRAVENOUS
  Filled 2012-05-21: qty 4

## 2012-05-21 MED ORDER — INSULIN GLARGINE 100 UNIT/ML ~~LOC~~ SOLN
30.0000 [IU] | Freq: Two times a day (BID) | SUBCUTANEOUS | Status: DC
  Administered 2012-05-21 (×2): 30 [IU] via SUBCUTANEOUS

## 2012-05-21 MED ORDER — PANTOPRAZOLE SODIUM 40 MG PO TBEC
40.0000 mg | DELAYED_RELEASE_TABLET | Freq: Every day | ORAL | Status: DC
  Administered 2012-05-21 – 2012-05-23 (×3): 40 mg via ORAL
  Filled 2012-05-21 (×4): qty 1

## 2012-05-21 MED ORDER — BISACODYL 10 MG RE SUPP
10.0000 mg | Freq: Two times a day (BID) | RECTAL | Status: DC | PRN
  Filled 2012-05-21: qty 1

## 2012-05-21 MED ORDER — ASPIRIN 81 MG PO CHEW
81.0000 mg | CHEWABLE_TABLET | Freq: Every day | ORAL | Status: DC
  Administered 2012-05-21 – 2012-05-24 (×4): 81 mg via ORAL
  Filled 2012-05-21 (×4): qty 1

## 2012-05-21 MED ORDER — INSULIN ASPART 100 UNIT/ML ~~LOC~~ SOLN
0.0000 [IU] | SUBCUTANEOUS | Status: DC
  Administered 2012-05-21: 4 [IU] via SUBCUTANEOUS
  Administered 2012-05-22: 11 [IU] via SUBCUTANEOUS
  Administered 2012-05-22: 3 [IU] via SUBCUTANEOUS
  Administered 2012-05-22 – 2012-05-23 (×5): 4 [IU] via SUBCUTANEOUS
  Administered 2012-05-24: 3 [IU] via SUBCUTANEOUS

## 2012-05-21 MED ORDER — MAGIC MOUTHWASH
15.0000 mL | Freq: Four times a day (QID) | ORAL | Status: DC | PRN
  Filled 2012-05-21: qty 15

## 2012-05-21 MED ORDER — METOPROLOL TARTRATE 1 MG/ML IV SOLN: 5.0000 mg | Freq: Four times a day (QID) | INTRAVENOUS | Status: DC | PRN

## 2012-05-21 MED ORDER — TORSEMIDE 10 MG PO TABS
10.0000 mg | ORAL_TABLET | Freq: Every day | ORAL | Status: DC
  Filled 2012-05-21: qty 1

## 2012-05-21 MED ORDER — LACTATED RINGERS IV BOLUS (SEPSIS): 1000.0000 mL | Freq: Three times a day (TID) | INTRAVENOUS | Status: AC | PRN

## 2012-05-21 MED ORDER — METOPROLOL TARTRATE 12.5 MG HALF TABLET
12.5000 mg | ORAL_TABLET | Freq: Two times a day (BID) | ORAL | Status: DC | PRN
  Filled 2012-05-21: qty 1

## 2012-05-21 NOTE — Progress Notes (Signed)
Patient ID: Adam [name], male   DOB: 04/14/1946, 66 y.o.   MRN: [id number] 5 Days Post-Op   Subjective: Pt had single episode of flatus.     Objective: Vital signs in last 24 hours: Temp:  [98.1 F (36.7 C)-98.6 F (37 C)] 98.5 F (36.9 C) (07/21 0521) Pulse Rate:  [77-88] 88  (07/21 0521) Resp:  [18-22] 22  (07/21 0826) BP: (138-159)/(78-88) 159/88 mmHg (07/21 0521) SpO2:  [93 %-98 %] 94 % (07/21 0826) Last BM Date: 05/16/12  Intake/Output from previous day: 07/20 0701 - 07/21 0700 In: 3060 [P.O.:60; I.V.:3000] Out: 2168 [Urine:1350; Emesis/NG output:800; Drains:18] Intake/Output this shift: Total I/O In: -  Out: 3 [Drains:3]  General appearance: alert, cooperative and no distress Resp: nonlabored Cardio: Reg GI: soft, remains slightly distended.  Drains serosanguinous. NGT output bilious Extremities: scd's bilaterally   Lab Results:   Basename 05/20/12 0515  WBC 7.0  HGB 12.4*  HCT 37.5*  PLT 203   BMET  Basename 05/20/12 0515  NA 135  K 3.9  CL 99  CO2 27  GLUCOSE 256*  BUN 14  CREATININE 1.04  CALCIUM 8.5   PT/INR No results found for this basename: LABPROT:2,INR:2 in the last 72 hours ABG No results found for this basename: PHART:2,PCO2:2,PO2:2,HCO3:2 in the last 72 hours  Studies/Results: Dg Abd 2 Views  29-May-2012  *RADIOLOGY REPORT*  Clinical Data: Postoperative from hernia repair, no bowel movement since surgery, abdominal pain  ABDOMEN - 2 VIEW  Comparison: 05/16/2012  Findings: Nasogastric tube in stomach. Air filled mildly distended loops of small bowel are seen in the upper abdomen with paucity of colonic gas question postoperative ileus. Surgical drains present. Ventral skin clips in abdomen. No definite bowel wall thickening or free intraperitoneal air. No acute osseous findings or definite urinary tract calcification.  IMPRESSION: Small bowel dilatation, question postoperative ileus.  Original Report Authenticated By: Burnetta Sabin,  M.D.    Anti-infectives: Anti-infectives     Start     Dose/Rate Route Frequency Ordered Stop   05/16/12 1600   ceFAZolin (ANCEF) IVPB 1 g/50 mL premix        1 g 100 mL/hr over 30 Minutes Intravenous Every 8 hours 05/16/12 1341     05/16/12 0556   ceFAZolin (ANCEF) 3 g in dextrose 5 % 50 mL IVPB        3 g 160 mL/hr over 30 Minutes Intravenous 60 min pre-op 05/16/12 0556 05/16/12 0753          Assessment/Plan: s/p Procedure(s) (LRB): HERNIA REPAIR INCISIONAL (N/A) INSERTION OF MESH (N/A) Await return of bowel function.   Pt with 1 time flatus.  Offered removal of NGT.  Pt nervous.  Will place NGT to gravity.  remain NPO.   Continue IVF/PCA Ambulate, pulmonary toilet.   Insulin for DM Pletal for vascular disease. Metoprolol/cozaar for HTN  LOS: 5 days    [facility] 05/21/2012

## 2012-05-21 NOTE — Progress Notes (Signed)
05-21-12 0600 NSG:  Pt remains NPO c NGT to LIWS - his lopressor is ordered po.  Do you want to change the order to iv or have [place] give it po and clamp the ngt for and hour?  Please clarify.  His last bp was 145 -159/83-88.

## 2012-05-21 NOTE — Progress Notes (Signed)
Pt desats w ambulation Normally diuretic dependent - will give lasix x1 IVF kvo w PRN boluses since +4L from preop Clamp NGT trial PRN inhalers Continue anticoagulation More aggressive w/u if does not improve or worsens

## 2012-05-22 LAB — BASIC METABOLIC PANEL
BUN: 14 mg/dL (ref 6–23)
CO2: 27 mEq/L (ref 19–32)
Calcium: 8.9 mg/dL (ref 8.4–10.5)
Creatinine, Ser: 0.97 mg/dL (ref 0.50–1.35)
Glucose, Bld: 210 mg/dL — ABNORMAL HIGH (ref 70–99)

## 2012-05-22 LAB — GLUCOSE, CAPILLARY
Glucose-Capillary: 135 mg/dL — ABNORMAL HIGH (ref 70–99)
Glucose-Capillary: 169 mg/dL — ABNORMAL HIGH (ref 70–99)
Glucose-Capillary: 181 mg/dL — ABNORMAL HIGH (ref 70–99)
Glucose-Capillary: 190 mg/dL — ABNORMAL HIGH (ref 70–99)
Glucose-Capillary: 88 mg/dL (ref 70–99)

## 2012-05-22 LAB — CBC
MCH: 27.8 pg (ref 26.0–34.0)
MCV: 84.8 fL (ref 78.0–100.0)
Platelets: 230 10*3/uL (ref 150–400)
RBC: 4.54 MIL/uL (ref 4.22–5.81)
RDW: 17.4 % — ABNORMAL HIGH (ref 11.5–15.5)

## 2012-05-22 MED ORDER — INSULIN GLARGINE 100 UNIT/ML ~~LOC~~ SOLN
50.0000 [IU] | Freq: Every day | SUBCUTANEOUS | Status: DC
  Administered 2012-05-22 – 2012-05-24 (×3): 50 [IU] via SUBCUTANEOUS

## 2012-05-22 MED ORDER — VITAMINS A & D EX OINT
TOPICAL_OINTMENT | CUTANEOUS | Status: AC
  Administered 2012-05-22: 5
  Filled 2012-05-22: qty 5

## 2012-05-22 MED ORDER — MORPHINE SULFATE 2 MG/ML IJ SOLN
1.0000 mg | INTRAMUSCULAR | Status: DC | PRN
  Administered 2012-05-22 – 2012-05-23 (×5): 2 mg via INTRAVENOUS
  Filled 2012-05-22 (×5): qty 1

## 2012-05-22 MED ORDER — ACETAMINOPHEN 325 MG PO TABS: 325.0000 mg | ORAL_TABLET | Freq: Four times a day (QID) | ORAL | Status: DC | PRN

## 2012-05-22 NOTE — Progress Notes (Signed)
Physical Therapy Treatment Patient Details Name: Adam [name] MRN: [id number] DOB: 03-Apr-1946 Today's Date: 05/22/2012 Time: 1440-1500 PT Time Calculation (min): 20 min  PT Assessment / Plan / Recommendation Comments on Treatment Session  Pt is progressing well and amb with the nursing staff. Pt plans to D/C back home with family. No equipment needed, pt stated "I don't need it" and no HH pt needed per LPT.    Follow Up Recommendations  No PT follow up    Barriers to Discharge        Equipment Recommendations  None recommended by PT    Recommendations for Other Services    Frequency Min 3X/week   Plan Discharge plan remains appropriate    Precautions / Restrictions   none  Pertinent Vitals/Pain C/o abd discomfort "all over'    Mobility  Bed Mobility Bed Mobility: Supine to Sit Rolling Left: 6: Modified independent (Device/Increase time) Details for Bed Mobility Assistance: HOB elevated 45' and increased time  Transfers Transfers: Sit to Stand;Stand to Sit Sit to Stand: 5: Supervision;From bed;From elevated surface Stand to Sit: 5: Supervision;To bed Details for Transfer Assistance: prefers elevated height 2nd s/p B TKR and pt is use to his lift chair @ home.  Ambulation/Gait Ambulation/Gait Assistance: 5: Supervision Ambulation Distance (Feet): 450 Feet Assistive device: Rolling walker Ambulation/Gait Assistance Details: uses RW in hallway for increased support and ABD discomfort but does not use it when he amb around his room. Pt also has been amb with nursing staff several times a day. Gait Pattern: Step-through pattern Gait velocity: pretty good gait speed     PT Goals Acute Rehab PT Goals PT Goal Formulation: With patient Potential to Achieve Goals: Good Pt will go Supine/Side to Sit: Independently PT Goal: Supine/Side to Sit - Progress: Progressing toward goal Pt will go Sit to Supine/Side: Independently PT Goal: Sit to Supine/Side - Progress:  Progressing toward goal Pt will go Sit to Stand: Independently PT Goal: Sit to Stand - Progress: Progressing toward goal Pt will Ambulate: >150 feet;with modified independence;with least restrictive assistive device PT Goal: Ambulate - Progress: Progressing toward goal  Visit Information  Last PT Received On: 05/22/12 Assistance Needed: +1    Subjective Data  Subjective: let's go Patient Stated Goal: home   Cognition    good   Balance   good  End of Session PT - End of Session Equipment Utilized During Treatment: Gait belt Activity Tolerance: Patient tolerated treatment well Patient left: in bed;with call bell/phone within reach;with family/visitor present   Rica Koyanagi  PTA [facility]  Acute  Rehab Pager     [phone]

## 2012-05-22 NOTE — Progress Notes (Signed)
05-22-12- 0430 NSG:  See downtime progress note but in summary pt has had 2 stools during this shift, has passed a lot of gas, accidentally pulled out his NGT, did not have it replaced, NO Nausea, is less SOB than yesterday is on 3l/m [place] sating 95% at rest - still gets DOE and de sats some up on 6l/m [place], but overall much improved, remains NPO and IVF only at 20 cc/hr (and noted in md's PN as HR flouid overload) L JP drain 17 cc in past 12 hours and R JP did not drain anything.

## 2012-05-22 NOTE — Progress Notes (Signed)
6 Days Post-Op  Subjective: NG tube pulled out in his sleep.  Passing flatus and BM x2. He says that he is feeling better.  Only on occasional PRN pain meds  Objective: Vital signs in last 24 hours: Temp:  [98.3 F (36.8 C)-98.6 F (37 C)] 98.6 F (37 C) (07/22 0346) Pulse Rate:  [70-76] 70  (07/22 0346) Resp:  [21-23] 21  (07/22 0346) BP: (152-153)/(11-75) 152/75 mmHg (07/22 0346) SpO2:  [60 %-96 %] 95 % (07/22 0346) Weight:  [307 lb 3.2 oz (139.345 kg)] 307 lb 3.2 oz (139.345 kg) (07/22 0300) Last BM Date: 05/16/12  Intake/Output from previous day: 07/21 0701 - 07/22 0700 In: 340 [I.V.:160; NG/GT:180] Out: 765 [Urine:450; Emesis/NG output:300; Drains:15] Intake/Output this shift:    General appearance: alert, cooperative and no distress Resp: nonlabored Cardio: normal rate, regular GI: soft, NT, ND, incisions without infection, JP's with minimal output  Lab Results:   Basename 05/22/12 0430 05/20/12 0515  WBC 6.9 7.0  HGB 12.6* 12.4*  HCT 38.5* 37.5*  PLT 230 203   BMET  Basename 05/22/12 0430 05/20/12 0515  NA 137 135  K 3.7 3.9  CL 100 99  CO2 27 27  GLUCOSE 210* 256*  BUN 14 14  CREATININE 0.97 1.04  CALCIUM 8.9 8.5   PT/INR No results found for this basename: LABPROT:2,INR:2 in the last 72 hours ABG No results found for this basename: PHART:2,PCO2:2,PO2:2,HCO3:2 in the last 72 hours  Studies/Results: No results found.  Anti-infectives: Anti-infectives     Start     Dose/Rate Route Frequency Ordered Stop   05/16/12 1600   ceFAZolin (ANCEF) IVPB 1 g/50 mL premix        1 g 100 mL/hr over 30 Minutes Intravenous Every 8 hours 05/16/12 1341     05/16/12 0556   ceFAZolin (ANCEF) 3 g in dextrose 5 % 50 mL IVPB        3 g 160 mL/hr over 30 Minutes Intravenous 60 min pre-op 05/16/12 0556 05/16/12 0753          Assessment/Plan: s/p Procedure(s) (LRB): HERNIA REPAIR INCISIONAL (N/A) INSERTION OF MESH (N/A) bowel function returning, no nausea.   will try to advance diet.  continue to mobilize.  work on exercise tolerance and mobility  LOS: 6 days    Madilyn Hook DAVID 05/22/2012

## 2012-05-23 LAB — GLUCOSE, CAPILLARY
Glucose-Capillary: 86 mg/dL (ref 70–99)
Glucose-Capillary: 170 mg/dL — ABNORMAL HIGH (ref 70–99)
Glucose-Capillary: 113 mg/dL — ABNORMAL HIGH (ref 70–99)
Glucose-Capillary: 129 mg/dL — ABNORMAL HIGH (ref 70–99)

## 2012-05-23 MED ORDER — HYDROCODONE-ACETAMINOPHEN 5-325 MG PO TABS
1.0000 | ORAL_TABLET | ORAL | Status: DC | PRN
  Administered 2012-05-23 – 2012-05-24 (×2): 1 via ORAL
  Filled 2012-05-23 (×3): qty 1

## 2012-05-23 MED FILL — Cefazolin in D5W Inj 1 GM/50ML: INTRAVENOUS | Qty: 50 | Status: AC

## 2012-05-23 MED FILL — Bisacodyl Suppos 10 MG: RECTAL | Qty: 1 | Status: AC

## 2012-05-23 NOTE — Progress Notes (Signed)
Discharge from PT  Physical Therapy Treatment Patient Details Name: Adam [name] MRN: [id number] DOB: October 09, 1946 Today's Date: 05/23/2012 Time: [id number] PT Time Calculation (min): 10 min  PT Assessment / Plan / Recommendation Comments on Treatment Session  Pt met all goals and plans on d/c home tomorrow.  Encouraged pt to continue ambulating.  SaO2 on room air during ambulation 91-94%.    Follow Up Recommendations  No PT follow up    Barriers to Discharge        Equipment Recommendations  None recommended by PT    Recommendations for Other Services    Frequency     Plan All goals met and education completed, patient dischaged from PT services    Precautions / Restrictions Precautions Precautions: None   Pertinent Vitals/Pain Pt reports 1/10 soreness in abdomen.    Mobility  Bed Mobility Bed Mobility: Supine to Sit;Sit to Supine Supine to Sit: 7: Independent Sit to Supine: 7: Independent Transfers Transfers: Sit to Stand;Stand to Sit Sit to Stand: From bed;7: Independent Stand to Sit: To bed;7: Independent Ambulation/Gait Ambulation/Gait Assistance: 6: Modified independent (Device/Increase time) Assistive device: Rolling walker Ambulation/Gait Assistance Details: pt does well with RW, declined trying cane Gait Pattern: Within Functional Limits Gait velocity: WNL    Exercises     PT Diagnosis:    PT Problem List:   PT Treatment Interventions:     PT Goals Acute Rehab PT Goals PT Goal: Supine/Side to Sit - Progress: Met PT Goal: Sit to Supine/Side - Progress: Met PT Goal: Sit to Stand - Progress: Met PT Goal: Ambulate - Progress: Met  Visit Information  Last PT Received On: 05/23/12 Assistance Needed: +1    Subjective Data  Subjective: I'm supposed to be d/c tomorrow.   Cognition  Overall Cognitive Status: Appears within functional limits for tasks assessed/performed    Balance     End of Session PT - End of Session Activity Tolerance: Patient  tolerated treatment well Patient left: in bed;with call bell/phone within reach;with family/visitor present   GP     Kelsee Preslar,KATHrine E 05/23/2012, 4:20 PM Pager: [phone]

## 2012-05-23 NOTE — Progress Notes (Signed)
O2 sats 94% ambulating in hall on room air. [place]

## 2012-05-23 NOTE — Progress Notes (Signed)
7 Days Post-Op  Subjective: Tolerating clears.  No nausea.  Still passing flatus and BM. ambulatory  Objective: Vital signs in last 24 hours: Temp:  [98.2 F (36.8 C)-98.4 F (36.9 C)] 98.4 F (36.9 C) (07/23 0604) Pulse Rate:  [72-81] 74  (07/23 0604) Resp:  [18] 18  (07/23 0604) BP: (136-152)/(76-83) 143/83 mmHg (07/23 0604) SpO2:  [92 %-98 %] 93 % (07/23 0604) Last BM Date: 05/21/12  Intake/Output from previous day: 07/22 0701 - 07/23 0700 In: 2124 [P.O.:1644; I.V.:480] Out: 925 [Urine:890; Drains:35] Intake/Output this shift:    General appearance: alert, cooperative and no distress Resp: nonlabored, off oxygen Cardio: normal rate, regular GI: soft, minimal incisional tenderness, ND, wound without infection Extremities: scd's bilat  Lab Results:   Basename 05/22/12 0430  WBC 6.9  HGB 12.6*  HCT 38.5*  PLT 230   BMET  Basename 05/22/12 0430  NA 137  K 3.7  CL 100  CO2 27  GLUCOSE 210*  BUN 14  CREATININE 0.97  CALCIUM 8.9   PT/INR No results found for this basename: LABPROT:2,INR:2 in the last 72 hours ABG No results found for this basename: PHART:2,PCO2:2,PO2:2,HCO3:2 in the last 72 hours  Studies/Results: No results found.  Anti-infectives: Anti-infectives     Start     Dose/Rate Route Frequency Ordered Stop   05/16/12 1600   ceFAZolin (ANCEF) IVPB 1 g/50 mL premix  Status:  Discontinued        1 g 100 mL/hr over 30 Minutes Intravenous Every 8 hours 05/16/12 1341 05/22/12 0728   05/16/12 0556   ceFAZolin (ANCEF) 3 g in dextrose 5 % 50 mL IVPB        3 g 160 mL/hr over 30 Minutes Intravenous 60 min pre-op 05/16/12 0556 05/16/12 0753          Assessment/Plan: s/p Procedure(s) (LRB): HERNIA REPAIR INCISIONAL (N/A) INSERTION OF MESH (N/A) Advance diet, continue to mobilize, should be okay for discharge home tomorrow if continues off oxygen and tolerating diet.  LOS: 7 days    Madilyn Hook DAVID 05/23/2012

## 2012-05-23 NOTE — Progress Notes (Signed)
SATURATION QUALIFICATIONS:  Patient Saturations on Room Air at Rest = 90-92%, lying in bed and resting  Patient Saturations on Room Air while Ambulating = 88-90%  Towards the end of the walk, pt saturation dropped to low 80s a few times but after taking deep breaths it came back up to 89-90%.

## 2012-05-24 ENCOUNTER — Other Ambulatory Visit: Payer: Self-pay | Admitting: Family Medicine

## 2012-05-24 LAB — GLUCOSE, CAPILLARY: Glucose-Capillary: 124 mg/dL — ABNORMAL HIGH (ref 70–99)

## 2012-05-24 MED ORDER — HYDROCODONE-ACETAMINOPHEN 5-325 MG PO TABS: 1.0000 | ORAL_TABLET | ORAL | Status: AC | PRN

## 2012-05-24 NOTE — Discharge Summary (Signed)
Physician Discharge Summary  Patient ID: Adam [name] MRN: [id number] DOB/AGE: 1946-08-15 66 y.o.  Admit date: 05/16/2012 Discharge date: 05/24/2012  Admission Diagnoses: ventral hernia  Discharge Diagnoses:  Principal Problem:  *Ventral hernia Active Problems:  DIABETES MELLITUS II, UNCOMPLICATED  NEUROPATHY, DIABETIC  OBESITY  OBSTRUCTIVE SLEEP APNEA  HYPERTENSION, BENIGN SYSTEMIC  CORONARY, ARTERIOSCLEROSIS  PERIPHERAL VASCULAR DISEASE WITH CLAUDICATION   Discharged Condition: stable  Hospital Course: to OR for ventral hernia repair with mesh 05/16/12.  Kept in stepdown overnight for cardiac monitoring and no issues.  On POD 1 transferred to tele and we awaited return of bowel function.   He was slow to return bowel function and NG tube kept to suction until it came out in his sleep over the weekend.  He began passing flatus and moving his bowels and diet advanced.  He was weaned off of oxygen and was ambulatory.  JP drains removed and final drain removed today 05/24/12 and he was tolerating regular diet, bowels functioning, and pain controlled and ready for discharge home on 05/24/12.  Consults: None  Significant Diagnostic Studies: none  Treatments: surgery: 05/16/12  Disposition:   Discharge Orders    Future Orders Please Complete By Expires   Diet - low sodium heart healthy      Increase activity slowly      Discharge instructions      Comments:   May shower tomorrow. May dress wounds with clean, dry gauze as needed for drainage. Call [phone] for follow up appointment with Dr. Lilyan Punt in about 2 weeks. Call [phone] for follow up appointment for staple removal early next week (mon-wed), may be nurse visit  No lifting more than 10 lbs for 4 weeks. Diet as tolerated. Monitor blood glucose levels tightly, and check levels prior to lantus injection as this may need adjustment until diet and appetite back to normal. Call primary physician ASAP to adjust medications.   Call MD for:  temperature >100.4      Call MD for:  redness, tenderness, or signs of infection (pain, swelling, redness, odor or green/yellow discharge around incision site)      Call MD for:  severe uncontrolled pain      Call MD for:  persistant nausea and vomiting      Consult to care management      Scheduling Instructions:   Evaluate for home health RN to assess mobility and provide walker   PT evaluation   05/24/13   Scheduling Instructions:   Please evaluate for home safety prior to discharge.  Discharge pending evaluation.     Medication List  As of 05/24/2012  8:31 AM   TAKE these medications         alprostadil 1000 MCG pellet   Commonly known as: MUSE   500 mcg by Transurethral route as needed. Trimix 10 ml with 30 mg papaverine, 0.5 mg regitine and 20 mcg prostin/ml inject .use no more than 3 times per week      BAYER CHILDRENS ASPIRIN 81 MG chewable tablet   Generic drug: aspirin   Chew 81 mg by mouth daily.      cilostazol 100 MG tablet   Commonly known as: PLETAL   Take 100 mg by mouth 2 (two) times daily.      esomeprazole 40 MG capsule   Commonly known as: NEXIUM   Take 1 capsule (40 mg total) by mouth daily before breakfast.      HYDROcodone-acetaminophen 5-325 MG per tablet   Commonly known  as: NORCO/VICODIN   Take 1 tablet by mouth every 4 (four) hours as needed (pain).      insulin aspart 100 UNIT/ML injection   Commonly known as: novoLOG   Inject 5-10 Units into the skin 3 (three) times daily before meals. Inject 5 units daily 15-30 mins before meals.  Dispense 1 month supply.      insulin glargine 100 UNIT/ML injection   Commonly known as: LANTUS   Inject 9 Units into the skin at bedtime.      insulin glargine 100 UNIT/ML injection   Commonly known as: LANTUS   Inject 95 Units into the skin 2 (two) times daily.      losartan 50 MG tablet   Commonly known as: COZAAR   Take 1 tablet (50 mg total) by mouth daily.      metoprolol 100 MG tablet    Commonly known as: LOPRESSOR   Take 100 mg by mouth 2 (two) times daily.      NON FORMULARY   Insulin syringe.      rosuvastatin 40 MG tablet   Commonly known as: CRESTOR   Take 40 mg by mouth at bedtime.      torsemide 20 MG tablet   Commonly known as: DEMADEX   Take 0.5 tablets (10 mg total) by mouth daily.             SignedMadilyn Hook DAVID 05/24/2012, 8:31 AM

## 2012-05-24 NOTE — Progress Notes (Signed)
Talked to patient about DCP/ [place]; patient lives with wife who was present at the bedside at the time of conversation; [facility] choices given to the patient, patient chose [facility] for HHRN/ walker and shower chair. Levora Dredge RN with [facility] called for arrangements; B Tamera Punt RN, BSN, [place]

## 2012-05-24 NOTE — Progress Notes (Signed)
8 Days Post-Op  Subjective: Tolerating diet, no nausea and still passing flatus.  Pain controlled  Objective: Vital signs in last 24 hours: Temp:  [97.8 F (36.6 C)-98.8 F (37.1 C)] 97.8 F (36.6 C) (07/24 0516) Pulse Rate:  [67-77] 67  (07/24 0516) Resp:  [18-20] 18  (07/24 0516) BP: (120-166)/(70-83) 166/83 mmHg (07/24 0516) SpO2:  [90 %-95 %] 95 % (07/24 0516) Last BM Date: 05/21/12  Intake/Output from previous day: 07/23 0701 - 07/24 0700 In: 780 [P.O.:780] Out: 316 [Urine:300; Drains:16] Intake/Output this shift: Total I/O In: -  Out: 400 [Urine:400]  General appearance: alert, cooperative and no distress Resp: nonlabored breathing, off oxygen Cardio: normal rate, regular, HR 80 GI: soft, minimal tenderness, ND, incision without infection, final JP removed after 27ml out 24hrs, no peritoneal signs, no evidence of recurrence  Lab Results:   Basename 05/22/12 0430  WBC 6.9  HGB 12.6*  HCT 38.5*  PLT 230   BMET  Basename 05/22/12 0430  NA 137  K 3.7  CL 100  CO2 27  GLUCOSE 210*  BUN 14  CREATININE 0.97  CALCIUM 8.9   PT/INR No results found for this basename: LABPROT:2,INR:2 in the last 72 hours ABG No results found for this basename: PHART:2,PCO2:2,PO2:2,HCO3:2 in the last 72 hours  Studies/Results: No results found.  Anti-infectives: Anti-infectives     Start     Dose/Rate Route Frequency Ordered Stop   05/16/12 1600   ceFAZolin (ANCEF) IVPB 1 g/50 mL premix  Status:  Discontinued        1 g 100 mL/hr over 30 Minutes Intravenous Every 8 hours 05/16/12 1341 05/22/12 0728   05/16/12 0556   ceFAZolin (ANCEF) 3 g in dextrose 5 % 50 mL IVPB        3 g 160 mL/hr over 30 Minutes Intravenous 60 min pre-op 05/16/12 0556 05/16/12 0753          Assessment/Plan: s/p Procedure(s) (LRB): HERNIA REPAIR INCISIONAL (N/A) INSERTION OF MESH (N/A) he seems to be doing well.  should be okay for discharge to home if passes PT home safety evaluation.  will  see him back early next week for staple removal.  LOS: 8 days    [place], [place] 05/24/2012

## 2012-05-27 ENCOUNTER — Other Ambulatory Visit: Payer: Self-pay | Admitting: Family Medicine

## 2012-05-29 ENCOUNTER — Telehealth (INDEPENDENT_AMBULATORY_CARE_PROVIDER_SITE_OTHER): Payer: Self-pay

## 2012-05-29 NOTE — Telephone Encounter (Signed)
Message copied by Ivor Costa on Mon May 29, 2012  4:26 PM ------      Message from: Joline Salt      Created: Fri May 26, 2012  2:39 PM      Regarding: Dr. Lilyan Punt      Contact: [phone]       Pt needs a nurse appt to take staples out. Sx 7/16

## 2012-05-29 NOTE — Telephone Encounter (Signed)
Staple removal scheduled for 05/31/12 @ 11:00 am w/Deloras Reichard M

## 2012-05-31 ENCOUNTER — Encounter (INDEPENDENT_AMBULATORY_CARE_PROVIDER_SITE_OTHER): Payer: Self-pay

## 2012-05-31 ENCOUNTER — Ambulatory Visit (INDEPENDENT_AMBULATORY_CARE_PROVIDER_SITE_OTHER): Payer: PRIVATE HEALTH INSURANCE | Admitting: General Surgery

## 2012-05-31 VITALS — HR 66 | Temp 98.4°F | Resp 18 | Ht 75.0 in | Wt 309.8 lb

## 2012-05-31 DIAGNOSIS — Z4802 Encounter for removal of sutures: Secondary | ICD-10-CM

## 2012-05-31 NOTE — Progress Notes (Signed)
Patient comes in today for staple removal, patient s/p open ventral hernia repair on 05/16/12.  Prior to staple removal I ask  Clarise Cruz, RN to take a look at his incision due to the length and amount of staples in place.  Staples removed Cecille Rubin) and placed steri-strips Alyse Low) on the incision.  Patient Incision intact, steri-strips placed.  Patient tolerated well.  Follow up appointment scheduled for 06/09/12 @ 9:45 am w/Dr. Lilyan Punt. Information above agreed.Cecille Rubin Tener7/31/13

## 2012-06-09 ENCOUNTER — Encounter (INDEPENDENT_AMBULATORY_CARE_PROVIDER_SITE_OTHER): Payer: Self-pay | Admitting: General Surgery

## 2012-06-09 ENCOUNTER — Ambulatory Visit (INDEPENDENT_AMBULATORY_CARE_PROVIDER_SITE_OTHER): Payer: PRIVATE HEALTH INSURANCE | Admitting: General Surgery

## 2012-06-09 VITALS — BP 128/84 | HR 68 | Temp 98.4°F | Resp 18 | Ht 75.0 in | Wt 302.2 lb

## 2012-06-09 DIAGNOSIS — Z5189 Encounter for other specified aftercare: Secondary | ICD-10-CM

## 2012-06-09 DIAGNOSIS — Z4889 Encounter for other specified surgical aftercare: Secondary | ICD-10-CM

## 2012-06-09 MED ORDER — OXYCODONE-ACETAMINOPHEN 5-325 MG PO TABS: 1.0000 | ORAL_TABLET | Freq: Four times a day (QID) | ORAL | Status: DC | PRN

## 2012-06-09 NOTE — Progress Notes (Signed)
Subjective:     Patient ID: Adam [name], male   DOB: 16-Jul-1946, 66 y.o.   MRN: [id number]  HPI F/u today 2.5 weeks s/p open incisional hernia with mesh.  He is doing well and pain continues to improve.  He is not taking pain meds because he is out.  He still has some occasional discomfort but this is improving and is mainly with activity.  His bowels are functioning well and tolerating regular diet.  His glucose has been improved.  Ambulating now without difficulty  Review of Systems     Objective:   Physical Exam No distress and nontoxic-appearing His abdomen is soft and has mild incisional tenderness but it is nondistended and his wound is healing well without sign of infection. There is no evidence of recurrent hernia with Valsalva    Assessment:     Status post open incisional hernia repair with mesh-improving He says that his discomfort that he had preoperatively has improved he still has some incisional discomfort with activity but this continues to improve. He is not taking any pain medication but would like a refill for the discomfort that he has with activity. Otherwise he has no complaints and seems to be doing appropriately postoperative    Plan:     Recommend continue IV for another few weeks until his wound is completely healed and the mesh is well incorporated. I think that the symptoms that he has postoperatively are normal for this stage and will continue to improve. I refilled his Percocet and recommended that he continue with light-duty. I'll see him back in about another 2 months and at that time his symptoms should be nearly completely resolved.

## 2012-07-04 ENCOUNTER — Telehealth: Payer: Self-pay | Admitting: Cardiology

## 2012-07-04 MED ORDER — METOPROLOL TARTRATE 100 MG PO TABS: 100.0000 mg | ORAL_TABLET | Freq: Two times a day (BID) | ORAL | Status: DC

## 2012-07-04 NOTE — Telephone Encounter (Signed)
Patient would like for you to call in his Metoprolol 100 mg to the [facility] on [address]

## 2012-07-06 ENCOUNTER — Telehealth: Payer: Self-pay | Admitting: Cardiovascular Disease

## 2012-07-06 MED ORDER — CILOSTAZOL 100 MG PO TABS: 100.0000 mg | ORAL_TABLET | Freq: Two times a day (BID) | ORAL | Status: DC

## 2012-07-06 NOTE — Telephone Encounter (Signed)
Appt scheduled with Dr Fletcher Anon in October and he was told that Dr Fletcher Anon had wanted to see him back in October for a f/u. Refill of Pletal sent to the pharmacy.

## 2012-07-06 NOTE — Telephone Encounter (Signed)
Pt was seen in June, requested refill, was given 30 days and told he had to be seen before anymore refills, pt wants to get more info as to why he needs an appt after being seen less than 3 months ago, pls call [phone]

## 2012-08-01 ENCOUNTER — Other Ambulatory Visit: Payer: Self-pay | Admitting: *Deleted

## 2012-08-01 MED ORDER — METOPROLOL TARTRATE 100 MG PO TABS: 100.0000 mg | ORAL_TABLET | Freq: Two times a day (BID) | ORAL | Status: DC

## 2012-08-02 ENCOUNTER — Encounter: Payer: Self-pay | Admitting: Cardiovascular Disease

## 2012-08-02 ENCOUNTER — Ambulatory Visit (INDEPENDENT_AMBULATORY_CARE_PROVIDER_SITE_OTHER): Payer: PRIVATE HEALTH INSURANCE | Admitting: Cardiovascular Disease

## 2012-08-02 ENCOUNTER — Other Ambulatory Visit: Payer: Self-pay | Admitting: Family Medicine

## 2012-08-02 VITALS — BP 171/82 | HR 105 | Ht 75.0 in | Wt 309.6 lb

## 2012-08-02 DIAGNOSIS — I7389 Other specified peripheral vascular diseases: Secondary | ICD-10-CM

## 2012-08-02 DIAGNOSIS — I10 Essential (primary) hypertension: Secondary | ICD-10-CM

## 2012-08-02 MED ORDER — CILOSTAZOL 100 MG PO TABS: 100.0000 mg | ORAL_TABLET | Freq: Two times a day (BID) | ORAL | Status: DC

## 2012-08-02 MED ORDER — METOPROLOL TARTRATE 100 MG PO TABS: 100.0000 mg | ORAL_TABLET | Freq: Two times a day (BID) | ORAL | Status: DC

## 2012-08-02 NOTE — Assessment & Plan Note (Signed)
His blood pressure is elevated and heart rate is also high. This is likely after he ran out of metoprolol recently. He will be given refills on this.

## 2012-08-02 NOTE — Patient Instructions (Addendum)
Your physician has requested that you have an ankle brachial index (ABI) IN 9 MONTHS  During this test an ultrasound and blood pressure cuff are used to evaluate the arteries that supply the arms and legs with blood. Allow thirty minutes for this exam. There are no restrictions or special instructions.  Your physician has recommended you make the following change in your medication:  RESTART METOPROLOL 100 MG ONE TABLET TWICE DAILY 12 HRS APART.   Your physician wants you to follow-up in: 9 MONTHS ONE WEEK AFTER ABI TEST.  You will receive a reminder letter in the mail two months in advance. If you don't receive a letter, please call our office to schedule the follow-up appointment.
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
## 2012-08-02 NOTE — Assessment & Plan Note (Signed)
ABI was moderately reduced bilaterally at 0.6 with evidence of chronic occlusion of the right SFA with significant popliteal disease as well as significant left SFA stenosis. He has stable claudication symptoms and currently these are not lifestyle limiting. His symptoms are significantly better when he is taking Pletal. I gave him refills on this medication. I recommend continuing medical therapy at this point and I also advised him to start a walking exercise program. He is on good medications for PAD. He is not a smoker. No indication for angiography at this time. I will have him come back for an ABI and followup in 9 months.

## 2012-08-03 ENCOUNTER — Encounter: Payer: Self-pay | Admitting: Family Medicine

## 2012-08-03 NOTE — Telephone Encounter (Signed)
No paper chart °

## 2012-08-04 ENCOUNTER — Encounter: Payer: Self-pay | Admitting: Family Medicine

## 2012-08-04 ENCOUNTER — Ambulatory Visit (INDEPENDENT_AMBULATORY_CARE_PROVIDER_SITE_OTHER): Payer: PRIVATE HEALTH INSURANCE | Admitting: Family Medicine

## 2012-08-04 VITALS — BP 154/61 | HR 85 | Temp 99.0°F | Ht 75.0 in | Wt 306.4 lb

## 2012-08-04 DIAGNOSIS — I10 Essential (primary) hypertension: Secondary | ICD-10-CM

## 2012-08-04 DIAGNOSIS — S2092XA Blister (nonthermal) of unspecified parts of thorax, initial encounter: Secondary | ICD-10-CM

## 2012-08-04 DIAGNOSIS — Z23 Encounter for immunization: Secondary | ICD-10-CM

## 2012-08-04 DIAGNOSIS — E119 Type 2 diabetes mellitus without complications: Secondary | ICD-10-CM

## 2012-08-04 DIAGNOSIS — S30823A Blister (nonthermal) of scrotum and testes, initial encounter: Secondary | ICD-10-CM

## 2012-08-04 DIAGNOSIS — L600 Ingrowing nail: Secondary | ICD-10-CM

## 2012-08-04 DIAGNOSIS — N492 Inflammatory disorders of scrotum: Secondary | ICD-10-CM | POA: Insufficient documentation

## 2012-08-04 LAB — POCT GLYCOSYLATED HEMOGLOBIN (HGB A1C): Hemoglobin A1C: 8.4

## 2012-08-04 MED ORDER — METFORMIN HCL 500 MG PO TABS: 500.0000 mg | ORAL_TABLET | Freq: Every day | ORAL | Status: DC

## 2012-08-04 MED ORDER — INSULIN GLARGINE 100 UNIT/ML ~~LOC~~ SOLN: 90.0000 [IU] | Freq: Two times a day (BID) | SUBCUTANEOUS | Status: DC

## 2012-08-04 MED ORDER — MUPIROCIN 2 % EX OINT: TOPICAL_OINTMENT | Freq: Three times a day (TID) | CUTANEOUS | Status: DC

## 2012-08-04 MED ORDER — METOPROLOL TARTRATE 100 MG PO TABS: 100.0000 mg | ORAL_TABLET | Freq: Two times a day (BID) | ORAL | Status: DC

## 2012-08-04 MED ORDER — ZOSTER VACCINE LIVE 19400 UNT/0.65ML ~~LOC~~ SOLR: 0.6500 mL | Freq: Once | SUBCUTANEOUS | Status: DC

## 2012-08-04 MED ORDER — INSULIN ASPART 100 UNIT/ML ~~LOC~~ SOLN: 5.0000 [IU] | Freq: Three times a day (TID) | SUBCUTANEOUS | Status: DC

## 2012-08-04 NOTE — Assessment & Plan Note (Signed)
Controlled per home and drug store readings, patient will begin logging and bring his machine in for a check. No red flags  Continue Metoprolol tart 100 BID and losartin 50 qd

## 2012-08-04 NOTE — Assessment & Plan Note (Signed)
Uncontrolled but improving, CBGs at home fasting around 100, 200's other times. A1C 8.3 today from 8.8 previously. No obvious complications, seen Ophtho recently.   Increase lantus to 90 UNits BID Metformin 500 qd  Continue current regimen for novolog

## 2012-08-04 NOTE — Patient Instructions (Signed)
Thanks for coming in today  Please start writing down your Blood pressure readings for me.   I have referred you to podiatry for your toenails  I'd like to see you ion 2 months for followup.

## 2012-08-04 NOTE — Progress Notes (Signed)
  Subjective:    Patient ID: Adam [name], male    DOB: [birth year], 66 y.o.   MRN: [id number]  HPI Pt here for f/u chronic medical problems   HTN:  120/80's at home recently and on checks at the drug store. Recently out of metoprolol and it was worse then. No headache, chest pain, dyspnea. Some palpitations.  Taking meds faithfully as long as he has them Recently got BP cuff for home  DM2:  Good if he watches his diet Avg CBGs: around 100 fasting,  Taking 85 units of lantus BID and novalog SS 5-10 No polyuria or polydipsia, no vison changes, no numbness/tingling of feet except stable numbness after a knee surgery  Scrotal sore: Has been there for months, swells throughout the day and drains after he takes a shower' Wants mupirocin but hasnt tried anything yet Drains bloody fluid, not yellow  Toes:  Ingrown toenails for past few months, R side starting to hurt him persistently No skin breakdown or drainage, no redness, no fever,     Review of Systems Constitutional: No fever, chills, sweats HEENT: + Congestion recently CV: No chest pain,+ palpitations Resp: No  cough, dyspnea  GI: No abdominal pain, n/v/d GU: No dysuria, hematuria, voiding/incontinence  Issues Neuro: No focal weakness, + tingling as above Derm: Skin lesion on scrotum as above Heme/Endo: No polyuria, polyphagia,  MSK: No major joint swelling, no joint/muscle pains     Objective:   Physical Exam  Gen: NAD, alert, cooperative with exam HEENT: NCAT, EOMI, PERRL CV: RRR, ngood s1/s2, o murmur Resp: CTABL, no wheezes, non-labored Abd: SNTND, obese, BS present, no guarding or organomegaly Ext: No edema   Diabetic foot exam, intact sensation with monofilament, no sores/skin breakdown, 2+ DP pulses, peeling Skin on soles of eet BL,   R great toe with toenail that is turned and embedded into skin, no skin break down, not tender to touch.  GU: Scrotal sore 0.5 x0.5 cm draining small amount of sero-sanguinous  fluid, tender to the touch Neuro: Alert and oriented, No gross deficits     Assessment & Plan:

## 2012-08-04 NOTE — Assessment & Plan Note (Signed)
Non healing blister for months, already draining sero-sanguinous fluid.   Trial of mupirocin for 1 week.  Discussed coming back if doesn't heal or if it gets red/inflamed, he agrees.

## 2012-08-04 NOTE — Assessment & Plan Note (Signed)
Embedded and painful on R, starting on the L. No skin breakdown. With him being diabetic think he will benefit from podiatry.   Referred to podiatry

## 2012-08-11 ENCOUNTER — Encounter (INDEPENDENT_AMBULATORY_CARE_PROVIDER_SITE_OTHER): Payer: PRIVATE HEALTH INSURANCE | Admitting: General Surgery

## 2012-08-15 ENCOUNTER — Telehealth (INDEPENDENT_AMBULATORY_CARE_PROVIDER_SITE_OTHER): Payer: Self-pay

## 2012-08-15 NOTE — Telephone Encounter (Signed)
Patient notified of appointment time rescheduled to 12:00 08/17/12 w/Dr. Lilyan Punt.

## 2012-08-17 ENCOUNTER — Ambulatory Visit (INDEPENDENT_AMBULATORY_CARE_PROVIDER_SITE_OTHER): Payer: PRIVATE HEALTH INSURANCE | Admitting: General Surgery

## 2012-08-17 ENCOUNTER — Encounter (INDEPENDENT_AMBULATORY_CARE_PROVIDER_SITE_OTHER): Payer: Self-pay | Admitting: General Surgery

## 2012-08-17 VITALS — BP 142/70 | HR 68 | Temp 97.0°F | Resp 16 | Ht 75.0 in | Wt 311.2 lb

## 2012-08-17 DIAGNOSIS — Z5189 Encounter for other specified aftercare: Secondary | ICD-10-CM

## 2012-08-17 DIAGNOSIS — R1012 Left upper quadrant pain: Secondary | ICD-10-CM

## 2012-08-17 DIAGNOSIS — Z4889 Encounter for other specified surgical aftercare: Secondary | ICD-10-CM

## 2012-08-17 MED ORDER — HYDROCODONE-ACETAMINOPHEN 5-325 MG PO TABS: 1.0000 | ORAL_TABLET | Freq: Four times a day (QID) | ORAL | Status: DC | PRN

## 2012-08-17 NOTE — Progress Notes (Signed)
Subjective:     Patient ID: Adam [name], male   DOB: [birth year], 66 y.o.   MRN: [id number]  HPI Patient follows up status post open repair of ventral hernia. He comes back today and has been doing very well except for some pinpoint tenderness in the left upper quadrant near history of. He says that this doesn't bother him with activities it only really bothers him when he lays on his left side. After about an hour he has tenderness in that area which limits him from lying on his left for a while. Otherwise his bowels are functioning normally and tolerating diet. His sugars are still poorly controlled.  Review of Systems     Objective:   Physical Exam No distress nontoxic appearing sitting comfortably on a chair.  And there is no evidence of recurrent hernia. His incision looks great and there is no bulge with Valsalva. He points to the area of tenderness near his left upper quadrant laparoscopic entry sites there is no evidence of hernia. This tenderness is at the rib margin      Assessment:     Status post open repair of incisional hernia He seems to be doing very well from his hernia surgery although he still has some left upper quadrant pain when lying on his side. I'm not sure what could be causing this. There is no evidence of recurrent hernia or postop condition. This could be from a transfascial suture. He is tender at the rib margins and this could be costochondritis as well. I would still give this some additional time and if his pain persists after several months, then I would consider pain management referral.    Plan:     Pain medications and if this persists after several months, then he will come back and we will set him up for pain management referral

## 2012-09-21 ENCOUNTER — Encounter: Payer: Self-pay | Admitting: Family Medicine

## 2012-11-02 ENCOUNTER — Telehealth: Payer: Self-pay | Admitting: *Deleted

## 2012-11-02 MED ORDER — INSULIN PEN NEEDLE 31G X 8 MM MISC: 1.0000 | Freq: Every day | Status: DC

## 2012-11-02 NOTE — Telephone Encounter (Signed)
Called pt. LMVM to schedule OV with Dr.Bradshaw. Waiting for call back. Javier Glazier, Gerrit Heck

## 2012-11-02 NOTE — Telephone Encounter (Signed)
Rx for pens filled, Please call and schedule follow up for DM and scrotal lesion

## 2012-11-16 ENCOUNTER — Other Ambulatory Visit: Payer: Self-pay | Admitting: Urology

## 2012-11-17 ENCOUNTER — Encounter: Payer: Self-pay | Admitting: Family Medicine

## 2012-11-24 ENCOUNTER — Encounter (HOSPITAL_BASED_OUTPATIENT_CLINIC_OR_DEPARTMENT_OTHER): Payer: Self-pay | Admitting: *Deleted

## 2012-11-24 ENCOUNTER — Other Ambulatory Visit: Payer: Self-pay | Admitting: *Deleted

## 2012-11-24 DIAGNOSIS — E119 Type 2 diabetes mellitus without complications: Secondary | ICD-10-CM

## 2012-11-24 MED ORDER — INSULIN GLARGINE 100 UNIT/ML ~~LOC~~ SOLN: 90.0000 [IU] | Freq: Two times a day (BID) | SUBCUTANEOUS | Status: DC

## 2012-11-24 NOTE — Progress Notes (Signed)
To [facility] at 0615-Istat on arrival,Ekg,Cxr in epic-will take metoprolol,nexium with small amt water -states understands instructed to bring CPAP and mask.

## 2012-11-29 NOTE — H&P (Signed)
ctive Problems Problems  1. Abscess Of The Scrotum 608.4 2. Nocturia 788.43 3. Organic Impotence 607.84 4. History of  Prostate Cancer V10.46  History of Present Illness  Adam [name] returns today in f/u.  He has a persistantly draining sinus in the right groin that is associated with a chronic abscess that I drained in November.  He has mild pain.   He has no fever.  He has been on several rounds of antibiotic but the drainage recurs after cessation of therapy.   Past Medical History Problems  1. History of  Acute Myocardial Infarction V12.59 2. History of  Arthritis V13.4 3. History of  Depression 311 4. History of  Diabetes Mellitus 250.00 5. History of  Gout 274.9 6. History of  Heartburn 787.1 7. History of  Hyperactivity Of The Bladder 596.51 8. History of  Hypertension 401.9 9. History of  Hypogonadism 257.2 10. History of  Prostate Cancer V10.46 11. History of  Radiation Therapy V15.3 12. History of  Urinary Frequency  Surgical History Problems  1. History of  Abdominal Aortic Aneurysm Repair For Dilation Or Occlusion 2. History of  Biopsy Of The Prostate Needle 3. History of  Incisional Hernia Repair With Implantation Of Mesh 4. History of  Knee Replacement 5. History of  Wrist Surgery  Current Meds 1. Adult Aspirin Low Strength 81 MG Oral Tablet Dispersible; Therapy: (Recorded:04Apr2008) to 2. Alprostadil 500 MCG/ML Injection Solution; trimix 69ml with 30mg  papaverine, 0.5mg  regitine and  34mcg prostin/mlinject 29ml prn up to 3 times weekly; Therapy: [id number] to (Last Rx:13Feb2013)  Requested for:  [id number] 3. Cilostazol 100 MG Oral Tablet; Therapy: (Recorded:04Apr2008) to 4. Crestor 40 MG Oral Tablet; Therapy: (Recorded:04Apr2008) to 5. Glucophage 1000 MG Oral Tablet; Therapy: (Recorded:13Feb2012) to 6. Hibiclens 4 % External Liquid; USE AS DIRECTED; Therapy: [id number] to (Last Rx:13Dec2013) 7. Indomethacin ER 75 MG Oral Capsule Extended Release; Therapy:  (Recorded:04Apr2008) to 8. Lantus 100 UNIT/ML Subcutaneous Solution; Therapy: (Recorded:13Feb2012) to 9. Losartan Potassium 50 MG Oral Tablet; Therapy: 25Apr2012 to 10. MetFORMIN HCl 1000 MG Oral Tablet; Therapy: 15Jul2011 to 11. Metoprolol Tartrate 100 MG Oral Tablet; Therapy: 02Aug2011 to 12. NexIUM 40 MG Oral Capsule Delayed Release; Therapy: (Recorded:04Apr2008) to 13. NovoLOG 100 UNIT/ML Subcutaneous Solution; Therapy: 15Apr2011 to 14. Torsemide 20 MG Oral Tablet; Therapy: [phone] to  Allergies Medication  1. No Known Drug Allergies  Family History Problems  1. Family history of  Family Health Status Number Of Children 2 sons; 2 daughters  Social History Problems  1. Marital History - Currently Married 2. Retired From Work 3. History of  Tobacco Use V15.82 Quit 18 years ago; smoked one pack per day for 20 years Denied  4. History of  Alcohol Use  Review of Systems  Constitutional: no fever.  Cardiovascular: no chest pain.  Respiratory: no shortness of breath.    Vitals Vital Signs [Data Includes: Last 1 Day]  [id number] 09:10AM  Blood Pressure: 153 / 80 Temperature: 97.5 F Heart Rate: 61  Physical Exam Constitutional: Well nourished and well developed . No acute distress.  Pulmonary: No respiratory distress and normal respiratory rhythm and effort.  Cardiovascular: Heart rate and rhythm are normal . No peripheral edema.  Genitourinary: He has a draining sinus in the right groin lateral to the scrotum.    Results/Data Urine [Data Includes: Last 1 Day]   [id number]  COLOR YELLOW   APPEARANCE CLEAR   SPECIFIC GRAVITY 1.020   pH 6.0   GLUCOSE NEG mg/dL  BILIRUBIN NEG  KETONE NEG mg/dL  BLOOD NEG   PROTEIN TRACE mg/dL  UROBILINOGEN 0.2 mg/dL  NITRITE NEG   LEUKOCYTE ESTERASE NEG    Assessment Assessed  1. Abscess Of The Scrotum 608.4   His scrotal abscess has a chronically draining sinus and the cavity has probably epithelialized.  He needs excision  of the cavity.   Plan Abscess Of The Scrotum (608.4)  1. Follow-up Schedule Surgery Office  Follow-up  Requested for: [id number] Health Maintenance (V70.0)  2. UA With REFLEX  Done: [id number] 09:00AM   I will set him up for excision of the abscess cavity.  I reviewed the risks of bleeding, infection, thrombotic events and anesthetic complications.   Discussion/Summary  CC: Dr. Zacarias Pontes FP, Dr. Wendi Snipes.

## 2012-11-30 ENCOUNTER — Encounter (HOSPITAL_BASED_OUTPATIENT_CLINIC_OR_DEPARTMENT_OTHER): Payer: Self-pay | Admitting: *Deleted

## 2012-11-30 ENCOUNTER — Encounter (HOSPITAL_BASED_OUTPATIENT_CLINIC_OR_DEPARTMENT_OTHER): Payer: Self-pay | Admitting: Anesthesiology

## 2012-11-30 ENCOUNTER — Encounter (HOSPITAL_BASED_OUTPATIENT_CLINIC_OR_DEPARTMENT_OTHER)
Admission: RE | Disposition: A | Discharge status: Home / Self Care | Payer: Self-pay | Source: Ambulatory Visit | Attending: Urology

## 2012-11-30 ENCOUNTER — Ambulatory Visit (HOSPITAL_BASED_OUTPATIENT_CLINIC_OR_DEPARTMENT_OTHER): Payer: PRIVATE HEALTH INSURANCE | Admitting: Anesthesiology

## 2012-11-30 ENCOUNTER — Ambulatory Visit (HOSPITAL_BASED_OUTPATIENT_CLINIC_OR_DEPARTMENT_OTHER)
Admission: RE | Admit: 2012-11-30 | Discharge: 2012-11-30 | Disposition: A | Discharge status: Home / Self Care | Payer: PRIVATE HEALTH INSURANCE | Source: Ambulatory Visit | Attending: Urology | Admitting: Urology

## 2012-11-30 DIAGNOSIS — E119 Type 2 diabetes mellitus without complications: Secondary | ICD-10-CM | POA: Insufficient documentation

## 2012-11-30 DIAGNOSIS — N498 Inflammatory disorders of other specified male genital organs: Secondary | ICD-10-CM | POA: Insufficient documentation

## 2012-11-30 DIAGNOSIS — R351 Nocturia: Secondary | ICD-10-CM | POA: Insufficient documentation

## 2012-11-30 DIAGNOSIS — N529 Male erectile dysfunction, unspecified: Secondary | ICD-10-CM | POA: Insufficient documentation

## 2012-11-30 DIAGNOSIS — G4730 Sleep apnea, unspecified: Secondary | ICD-10-CM | POA: Insufficient documentation

## 2012-11-30 DIAGNOSIS — Z79899 Other long term (current) drug therapy: Secondary | ICD-10-CM | POA: Insufficient documentation

## 2012-11-30 DIAGNOSIS — I10 Essential (primary) hypertension: Secondary | ICD-10-CM | POA: Insufficient documentation

## 2012-11-30 DIAGNOSIS — Z8546 Personal history of malignant neoplasm of prostate: Secondary | ICD-10-CM | POA: Insufficient documentation

## 2012-11-30 DIAGNOSIS — L03319 Cellulitis of trunk, unspecified: Secondary | ICD-10-CM | POA: Insufficient documentation

## 2012-11-30 DIAGNOSIS — I252 Old myocardial infarction: Secondary | ICD-10-CM | POA: Insufficient documentation

## 2012-11-30 DIAGNOSIS — L02219 Cutaneous abscess of trunk, unspecified: Secondary | ICD-10-CM | POA: Insufficient documentation

## 2012-11-30 HISTORY — PX: INCISION AND DRAINAGE ABSCESS: SHX5864

## 2012-11-30 LAB — POCT I-STAT 4, (NA,K, GLUC, HGB,HCT)
Glucose, Bld: 104 mg/dL — ABNORMAL HIGH (ref 70–99)
Potassium: 3.6 mEq/L (ref 3.5–5.1)

## 2012-11-30 SURGERY — INCISION AND DRAINAGE, ABSCESS
Anesthesia: Spinal | Site: Groin | Laterality: Right | Wound class: Clean

## 2012-11-30 MED ORDER — FENTANYL CITRATE 0.05 MG/ML IJ SOLN
INTRAMUSCULAR | Status: DC | PRN
  Administered 2012-11-30 (×2): 50 ug via INTRAVENOUS

## 2012-11-30 MED ORDER — ACETAMINOPHEN 325 MG PO TABS
650.0000 mg | ORAL_TABLET | ORAL | Status: DC | PRN
  Filled 2012-11-30: qty 2

## 2012-11-30 MED ORDER — LACTATED RINGERS IV SOLN
INTRAVENOUS | Status: DC
  Administered 2012-11-30: 07:00:00 via INTRAVENOUS
  Filled 2012-11-30: qty 1000

## 2012-11-30 MED ORDER — CEFAZOLIN SODIUM-DEXTROSE 2-3 GM-% IV SOLR
2.0000 g | INTRAVENOUS | Status: DC
  Filled 2012-11-30: qty 50

## 2012-11-30 MED ORDER — MIDAZOLAM HCL 5 MG/5ML IJ SOLN
INTRAMUSCULAR | Status: DC | PRN
  Administered 2012-11-30 (×2): 1 mg via INTRAVENOUS

## 2012-11-30 MED ORDER — OXYCODONE HCL 5 MG PO TABS
5.0000 mg | ORAL_TABLET | ORAL | Status: DC | PRN
Start: 2012-11-30 — End: 2012-11-30
  Filled 2012-11-30: qty 2

## 2012-11-30 MED ORDER — SODIUM CHLORIDE 0.9 % IJ SOLN
3.0000 mL | INTRAMUSCULAR | Status: DC | PRN
  Filled 2012-11-30: qty 3

## 2012-11-30 MED ORDER — HYDROCODONE-ACETAMINOPHEN 5-325 MG PO TABS: 1.0000 | ORAL_TABLET | Freq: Four times a day (QID) | ORAL | Status: DC | PRN

## 2012-11-30 MED ORDER — PROMETHAZINE HCL 25 MG/ML IJ SOLN
6.2500 mg | INTRAMUSCULAR | Status: DC | PRN
  Filled 2012-11-30: qty 1

## 2012-11-30 MED ORDER — SODIUM CHLORIDE 0.9 % IJ SOLN
3.0000 mL | Freq: Two times a day (BID) | INTRAMUSCULAR | Status: DC
  Filled 2012-11-30: qty 3

## 2012-11-30 MED ORDER — ACETAMINOPHEN 650 MG RE SUPP
650.0000 mg | RECTAL | Status: DC | PRN
  Filled 2012-11-30: qty 1

## 2012-11-30 MED ORDER — DEXTROSE 5 % IV SOLN
3.0000 g | INTRAVENOUS | Status: AC
  Administered 2012-11-30 (×2): 3 g via INTRAVENOUS
  Filled 2012-11-30: qty 3000

## 2012-11-30 MED ORDER — FENTANYL CITRATE 0.05 MG/ML IJ SOLN
25.0000 ug | INTRAMUSCULAR | Status: DC | PRN
  Filled 2012-11-30: qty 1

## 2012-11-30 MED ORDER — BUPIVACAINE HCL (PF) 0.25 % IJ SOLN
INTRAMUSCULAR | Status: DC | PRN
  Administered 2012-11-30: 10 mL

## 2012-11-30 MED ORDER — LIDOCAINE IN DEXTROSE 5-7.5 % IV SOLN
INTRAVENOUS | Status: DC | PRN
  Administered 2012-11-30: 60 mg via INTRATHECAL

## 2012-11-30 MED ORDER — SODIUM CHLORIDE 0.9 % IV SOLN
250.0000 mL | INTRAVENOUS | Status: DC | PRN
  Filled 2012-11-30: qty 250

## 2012-11-30 MED ORDER — ONDANSETRON HCL 4 MG/2ML IJ SOLN
4.0000 mg | Freq: Four times a day (QID) | INTRAMUSCULAR | Status: DC | PRN
  Filled 2012-11-30: qty 2

## 2012-11-30 SURGICAL SUPPLY — 56 items
APL SKNCLS STERI-STRIP NONHPOA (GAUZE/BANDAGES/DRESSINGS)
APPLICATOR COTTON TIP 6IN STRL (MISCELLANEOUS) IMPLANT
BANDAGE GAUZE ELAST BULKY 4 IN (GAUZE/BANDAGES/DRESSINGS) ×2 IMPLANT
BENZOIN TINCTURE PRP APPL 2/3 (GAUZE/BANDAGES/DRESSINGS) ×1 IMPLANT
BLADE SURG 15 STRL LF DISP TIS (BLADE) ×1 IMPLANT
BLADE SURG 15 STRL SS (BLADE) ×2
BLADE SURG ROTATE 9660 (MISCELLANEOUS) ×2 IMPLANT
CANISTER SUCTION 1200CC (MISCELLANEOUS) ×2 IMPLANT
CLEANER CAUTERY TIP 5X5 PAD (MISCELLANEOUS) ×1 IMPLANT
CLOTH BEACON ORANGE TIMEOUT ST (SAFETY) ×2 IMPLANT
COVER MAYO STAND STRL (DRAPES) ×2 IMPLANT
COVER TABLE BACK 60X90 (DRAPES) ×2 IMPLANT
DISSECTOR ROUND CHERRY 3/8 STR (MISCELLANEOUS) IMPLANT
DRAIN PENROSE 18X1/4 LTX STRL (WOUND CARE) IMPLANT
DRAPE LAPAROTOMY TRNSV 102X78 (DRAPE) ×2 IMPLANT
DRSG PAD ABDOMINAL 8X10 ST (GAUZE/BANDAGES/DRESSINGS) ×1 IMPLANT
DRSG TEGADERM 4X4.75 (GAUZE/BANDAGES/DRESSINGS) ×2 IMPLANT
ELECT NDL TIP 2.8 STRL (NEEDLE) IMPLANT
ELECT NEEDLE TIP 2.8 STRL (NEEDLE) IMPLANT
ELECT REM PT RETURN 9FT ADLT (ELECTROSURGICAL) ×2
ELECTRODE REM PT RTRN 9FT ADLT (ELECTROSURGICAL) ×1 IMPLANT
GAUZE SPONGE 4X4 12PLY STRL LF (GAUZE/BANDAGES/DRESSINGS) ×3 IMPLANT
GLOVE BIO SURGEON STRL SZ 6.5 (GLOVE) ×1 IMPLANT
GLOVE BIO SURGEON STRL SZ7.5 (GLOVE) ×1 IMPLANT
GLOVE ECLIPSE 6.0 STRL STRAW (GLOVE) ×1 IMPLANT
GLOVE SURG SS PI 8.0 STRL IVOR (GLOVE) ×2 IMPLANT
GOWN PREVENTION PLUS LG XLONG (DISPOSABLE) ×2 IMPLANT
GOWN STRL REIN XL XLG (GOWN DISPOSABLE) ×2 IMPLANT
NEEDLE HYPO 22GX1.5 SAFETY (NEEDLE) ×2 IMPLANT
NS IRRIG 500ML POUR BTL (IV SOLUTION) IMPLANT
PACK BASIN DAY SURGERY FS (CUSTOM PROCEDURE TRAY) ×2 IMPLANT
PAD CLEANER CAUTERY TIP 5X5 (MISCELLANEOUS)
PENCIL BUTTON HOLSTER BLD 10FT (ELECTRODE) ×2 IMPLANT
STRIP CLOSURE SKIN 1/2X4 (GAUZE/BANDAGES/DRESSINGS) ×1 IMPLANT
SUPPORT SCROTAL LG STRP (MISCELLANEOUS) IMPLANT
SUT CHROMIC 3 0 SH 27 (SUTURE) ×2 IMPLANT
SUT CHROMIC GUT AB #0 18 (SUTURE) IMPLANT
SUT PROLENE 2 0 CT2 30 (SUTURE) IMPLANT
SUT SILK 3 0 TIES 17X18 (SUTURE)
SUT SILK 3-0 18XBRD TIE BLK (SUTURE) IMPLANT
SUT VIC AB 3-0 SH 27 (SUTURE)
SUT VIC AB 3-0 SH 27X BRD (SUTURE) IMPLANT
SUT VIC AB 4-0 P-3 18XBRD (SUTURE) IMPLANT
SUT VIC AB 4-0 P3 18 (SUTURE)
SUT VICRYL 0 TIES 12 18 (SUTURE) IMPLANT
SUT VICRYL 2 0 18  UND BR (SUTURE)
SUT VICRYL 2 0 18 UND BR (SUTURE) IMPLANT
SUT VICRYL 4-0 PS2 18IN ABS (SUTURE) IMPLANT
SYR BULB IRRIGATION 50ML (SYRINGE) ×2 IMPLANT
SYR CONTROL 10ML LL (SYRINGE) ×2 IMPLANT
TOWEL NATURAL 6PK STERILE (DISPOSABLE) ×1 IMPLANT
TOWEL OR 17X24 6PK STRL BLUE (TOWEL DISPOSABLE) ×4 IMPLANT
TRAY DSU PREP LF (CUSTOM PROCEDURE TRAY) ×2 IMPLANT
TUBE CONNECTING 12X1/4 (SUCTIONS) ×2 IMPLANT
WATER STERILE IRR 500ML POUR (IV SOLUTION) IMPLANT
YANKAUER SUCT BULB TIP NO VENT (SUCTIONS) ×2 IMPLANT

## 2012-11-30 NOTE — Anesthesia Postprocedure Evaluation (Signed)
  Anesthesia Post-op Note  Patient: Software engineer  Procedure(s) Performed: Procedure(s) (LRB): INCISION AND DRAINAGE ABSCESS (Right)  Patient Location: PACU  Anesthesia Type: Spinal  Level of Consciousness: awake and alert   Airway and Oxygen Therapy: Patient Spontanous Breathing  Post-op Pain: mild  Post-op Assessment: Post-op Vital signs reviewed, Patient's Cardiovascular Status Stable, Respiratory Function Stable, Patent Airway and No signs of Nausea or vomiting  Last Vitals:  Filed Vitals:   11/30/12 0845  BP: 101/54  Pulse: 51  Temp:   Resp: 18    Post-op Vital Signs: stable   Complications: No apparent anesthesia complications

## 2012-11-30 NOTE — Addendum Note (Signed)
Addendum  created 11/30/12 0915 by Myrtie Soman, MD   Modules edited:Orders, PRL Based Order Sets

## 2012-11-30 NOTE — Transfer of Care (Signed)
Immediate Anesthesia Transfer of Care Note  Patient: Adam [name]  Procedure(s) Performed: Procedure(s) (LRB) with comments: INCISION AND DRAINAGE ABSCESS (Right) - EXCISION OF RIGHT GROIN ABSCESS  Patient Location: PACU  Anesthesia Type:Spinal  Level of Consciousness: awake, alert  and oriented  Airway & Oxygen Therapy: Patient Spontanous Breathing and Patient connected to nasal cannula oxygen  Post-op Assessment: Report given to PACU RN  Post vital signs: Reviewed and stable  Complications: No apparent anesthesia complications

## 2012-11-30 NOTE — Brief Op Note (Signed)
11/30/2012  8:11 AM  PATIENT:  Adam [name]  67 y.o. male  PRE-OPERATIVE DIAGNOSIS:  RIGHT GROIN ABSCESS  POST-OPERATIVE DIAGNOSIS:  RIGHT GROIN AND SCROTAL ABSCESS  PROCEDURE:  Procedure(s) (LRB) with comments: Excisional debridement of right groin and scrotal abscess- 5x6cm skin and subcutaneous tissue.  SURGEON:  Surgeon(s) and Role:    * Malka So, MD - Primary  PHYSICIAN ASSISTANT:   ASSISTANTS: none   ANESTHESIA:   spinal  EBL:  Total I/O In: 100 [I.V.:100] Out: -   BLOOD ADMINISTERED:none  DRAINS: none   LOCAL MEDICATIONS USED:  MARCAINE     SPECIMEN:  Source of Specimen:  Scrotal wall and granulation tissue.   DISPOSITION OF SPECIMEN:  PATHOLOGY  COUNTS:  YES  TOURNIQUET:  * No tourniquets in log *  DICTATION: .Other Dictation: Dictation Number [id number]  PLAN OF CARE: Discharge to home after PACU  PATIENT DISPOSITION:  PACU - hemodynamically stable.   Delay start of Pharmacological VTE agent (>24hrs) due to surgical blood loss or risk of bleeding: not applicable

## 2012-11-30 NOTE — Op Note (Signed)
NAMEPAULL, [name]:  [id number]  MEDICAL RECORD NO.:  [id number]  LOCATION:                                 FACILITY:  PHYSICIAN:  Marshall Cork. Jeffie Pollock, M.D.         DATE OF BIRTH:  DATE OF PROCEDURE:  11/30/2012 DATE OF DISCHARGE:                              OPERATIVE REPORT   PROCEDURE:  Excisional debridement of chronic right groin and scrotal abscess.  PREOPERATIVE DIAGNOSIS:  Chronic groin abscess.  POSTOPERATIVE DIAGNOSIS:  Chronic groin abscess with scrotal involvement.  SURGEON:  Marshall Cork. Jeffie Pollock, M.D.  ANESTHESIA:  Spinal.  DRAINS:  None.  BLOOD LOSS:  Minimal.  COMPLICATIONS:  None.  INDICATIONS:  The patient is a 67 year old male, who has a chronic draining sinus in the right groin, that is consistent with a recurring abscess.  It was felt to be too complex for in office management and he is now brought to the operating room for excision of the abscess cavity.  FINDINGS OF PROCEDURE:  He was given antibiotic and taken to the operating room where spinal anesthetic was induced.  He was placed in the supine position.  His genitalia was clipped, and he was prepped with Betadine solution.  He right leg was placed in a slight frog-legged position, and he was draped in usual sterile fashion.  The abscess cavity was probed and was more extensive than previously noted.  It extended from the groin in the area of the superolateral right scrotum, medial under the scrotal skin for approximately 3 to 4 cm and inferiorly along the groin crease for approximately 4 to 5 cm.  The overlying skin and subcutaneous tissue were incised initially with a scalpel.  This was followed by the Bovie on cutting, exposing a mature abscess cavity before granulation tissue.  The overlying skin and subcutaneous tissue were then excised.  The area of the eventual defect was approximately 5 x 6 cm.  The strip of skin approximately 1.5 x 4 cm was excised.  The underlying  granulation tissue was debrided with gauze, sponge, and the Bovie and once the area had been cleaned to its full extent, hemostasis was achieved with the Bovie.  The area was cleansed and wound was packed with a saline gauze, followed by an ABD, followed by a mesh patch.  The patient was then taken to the recovery room in stable condition.  There were no complications.     Marshall Cork. Jeffie Pollock, M.D.     JJW/MEDQ  D:  11/30/2012  T:  11/30/2012  Job:  [id number]

## 2012-11-30 NOTE — Progress Notes (Signed)
Dr. Jeffie Pollock called to clarify dressing change. Patient to change once a day with normal saline wet to dry dressing.

## 2012-11-30 NOTE — Interval H&P Note (Signed)
History and Physical Interval Note:  11/30/2012 7:23 AM  Adam [name]  has presented today for surgery, with the diagnosis of RIGHT GROIN ABSCESS  The various methods of treatment have been discussed with the patient and family. After consideration of risks, benefits and other options for treatment, the patient has consented to  Procedure(s) (LRB) with comments: INCISION AND DRAINAGE ABSCESS (Right) - EXCISION OF RIGHT GROIN ABSCESS as a surgical intervention .  The patient's history has been reviewed, patient examined, no change in status, stable for surgery.  I have reviewed the patient's chart and labs.  Questions were answered to the patient's satisfaction.     Milanya Sunderland J

## 2012-11-30 NOTE — Anesthesia Procedure Notes (Signed)
Spinal  Patient location during procedure: OR Staffing Performed by: anesthesiologist  Preanesthetic Checklist Completed: patient identified, site marked, surgical consent, pre-op evaluation, timeout performed, IV checked, risks and benefits discussed and monitors and equipment checked Spinal Block Patient position: sitting Prep: Betadine Patient monitoring: heart rate, continuous pulse ox and blood pressure Injection technique: single-shot Needle Needle type: Quincke  Needle gauge: 22 G Needle length: 9 cm Additional Notes Expiration date of kit checked and confirmed. Patient tolerated procedure well, without complications.     

## 2012-11-30 NOTE — Anesthesia Preprocedure Evaluation (Signed)
Anesthesia Evaluation  Patient identified by MRN, date of birth, ID band Patient awake    Reviewed: Allergy & Precautions, H&P , NPO status , Patient's Chart, lab work & pertinent test results  Airway Mallampati: III TM Distance: <3 FB Neck ROM: Full    Dental No notable dental hx.    Pulmonary sleep apnea ,  breath sounds clear to auscultation  Pulmonary exam normal       Cardiovascular hypertension, + CAD, + Past MI and + Peripheral Vascular Disease Rhythm:Regular Rate:Normal     Neuro/Psych negative neurological ROS  negative psych ROS   GI/Hepatic negative GI ROS, Neg liver ROS,   Endo/Other  diabetes, Insulin DependentMorbid obesity  Renal/GU Renal InsufficiencyRenal disease  negative genitourinary   Musculoskeletal negative musculoskeletal ROS (+)   Abdominal   Peds negative pediatric ROS (+)  Hematology negative hematology ROS (+)   Anesthesia Other Findings   Reproductive/Obstetrics negative OB ROS                           Anesthesia Physical Anesthesia Plan  ASA: III  Anesthesia Plan: Spinal   Post-op Pain Management:    Induction:   Airway Management Planned: Simple Face Mask  Additional Equipment:   Intra-op Plan:   Post-operative Plan:   Informed Consent: I have reviewed the patients History and Physical, chart, labs and discussed the procedure including the risks, benefits and alternatives for the proposed anesthesia with the patient or authorized representative who has indicated his/her understanding and acceptance.   Dental advisory given  Plan Discussed with: CRNA and Surgeon  Anesthesia Plan Comments:         Anesthesia Quick Evaluation

## 2012-12-01 ENCOUNTER — Encounter (HOSPITAL_BASED_OUTPATIENT_CLINIC_OR_DEPARTMENT_OTHER): Payer: Self-pay | Admitting: Urology

## 2012-12-22 ENCOUNTER — Encounter: Payer: Self-pay | Admitting: Family Medicine

## 2013-01-09 ENCOUNTER — Other Ambulatory Visit: Payer: Self-pay | Admitting: Family Medicine

## 2013-01-11 ENCOUNTER — Telehealth: Payer: Self-pay | Admitting: Family Medicine

## 2013-01-11 MED ORDER — INDOMETHACIN 50 MG PO CAPS: 50.0000 mg | ORAL_CAPSULE | Freq: Two times a day (BID) | ORAL | Status: DC | PRN

## 2013-01-11 NOTE — Telephone Encounter (Signed)
Will fwd. To PCP for refill. Javier Glazier, Gerrit Heck

## 2013-01-11 NOTE — Telephone Encounter (Signed)
Patient is calling because he called in a request for a refill on his medication for gout and he is out so he would appreciate if Dr. Wendi Snipes will send it in.

## 2013-01-11 NOTE — Telephone Encounter (Signed)
Called patient to discuss what Gout med he needs. He has been using indomethacin occasionally and has run out. Rx sent.

## 2013-02-06 ENCOUNTER — Encounter: Payer: Self-pay | Admitting: Family Medicine

## 2013-02-06 ENCOUNTER — Ambulatory Visit (INDEPENDENT_AMBULATORY_CARE_PROVIDER_SITE_OTHER): Payer: PRIVATE HEALTH INSURANCE | Admitting: Family Medicine

## 2013-02-06 VITALS — BP 160/79 | HR 67 | Ht 75.0 in | Wt 327.0 lb

## 2013-02-06 DIAGNOSIS — I10 Essential (primary) hypertension: Secondary | ICD-10-CM

## 2013-02-06 DIAGNOSIS — E119 Type 2 diabetes mellitus without complications: Secondary | ICD-10-CM

## 2013-02-06 DIAGNOSIS — M109 Gout, unspecified: Secondary | ICD-10-CM

## 2013-02-06 MED ORDER — LOSARTAN POTASSIUM 100 MG PO TABS: 100.0000 mg | ORAL_TABLET | Freq: Every day | ORAL | Status: DC

## 2013-02-06 MED ORDER — INDOMETHACIN 50 MG PO CAPS: 50.0000 mg | ORAL_CAPSULE | Freq: Two times a day (BID) | ORAL | Status: DC | PRN

## 2013-02-06 MED ORDER — LOSARTAN POTASSIUM 100 MG PO TABS: 50.0000 mg | ORAL_TABLET | Freq: Every day | ORAL | Status: DC

## 2013-02-06 NOTE — Progress Notes (Signed)
  Subjective:    Patient ID: Adam [name], male    DOB: [birth year], 67 y.o.   MRN: [id number]  HPI Pt here to f/u chronic medical problems  HTN: 160/79 today is abnormal he says, [place] 110-120/60-70, no chest pain, dyspnea, or edema Occasional palpaitation without other symptoms like weakness/fatigue/syncope  Scheduled for colonoiscopy in 2 days  DM2, compliant with meds Fasting this am 200 was not Nl, [place] 90-120 Post prandial's 160-170 See's Optho annually,  No polyuria, polydipsia, vision loss,  Having midnioght snacks and wants to start there, also cut out sugary drinks  Scrotal ulcer managed by urology and healing  Review of Systems P[er HPI    Objective:   Physical Exam  Gen: NAD, alert, cooperative with exam HEENT: NCAT, MMM CV: RRR, good S1/S2, no murmur Resp: CTABL, no wheezes, non-labored Abd: protuberant, + BS Ext: No edema Neuro: Alert and oriented, No gross deficits      Assessment & Plan:

## 2013-02-06 NOTE — Patient Instructions (Signed)
Lets follow up in 3 months for diabetes Remember to increase your mealtime novalog to 10 units each meal and lets try to cut out those midnight snacks  Diet Recommendations for Diabetes   Starchy (carb) foods include: Bread, rice, pasta, potatoes, corn, crackers, bagels, muffins, all baked goods.   Protein foods include: Meat, fish, poultry, eggs, dairy foods, and beans such as pinto and kidney beans (beans also provide carbohydrate).   1. Eat at least 3 meals and 1-2 snacks per day. Never go more than 4-5 hours while awake without eating.  2. Limit starchy foods to TWO per meal and ONE per snack. ONE portion of a starchy  food is equal to the following:   - ONE slice of bread (or its equivalent, such as half of a hamburger bun).   - 1/2 cup of a "scoopable" starchy food such as potatoes or rice.   - 1 OUNCE (28 grams) of starchy snack foods such as crackers or pretzels (look on label).   - 15 grams of carbohydrate as shown on food label.  3. Both lunch and dinner should include a protein food, a carb food, and vegetables.   - Obtain twice as many veg's as protein or carbohydrate foods for both lunch and dinner.   - Try to keep frozen veg's on hand for a quick vegetable serving.     - Fresh or frozen veg's are best.  4. Breakfast should always include protein.

## 2013-02-06 NOTE — Assessment & Plan Note (Signed)
Intermittently bothering him lately, likes indomethacin PRN, takes occasionally Continue indomethacin, sent Rx b/c last one wasn't covered due to no accompanying indication

## 2013-02-06 NOTE — Assessment & Plan Note (Signed)
Elevated today at 160/79, no improvement with re-check after sitting for 20 min Some palpitations, asymptomatic. Otherwise no red flags Continue metoprolol and increase Losartan to 100 daily F/u 3 months, will bring cuff for verification at wife's next visit in 1 week

## 2013-02-06 NOTE — Assessment & Plan Note (Addendum)
Uncontrolled, A1C 9.4 today, possibly worsened by current infection (scrotal abscess) Wants to try diet modification more aggresively before increasing insulin much Currently SSI novalog, increase to 10 units TID Continue Metformin and 90 U lantus BID F/u 3 months, food goal was midnight snacking and cutting back drinks with sugar

## 2013-02-08 ENCOUNTER — Other Ambulatory Visit: Payer: Self-pay | Admitting: Gastroenterology

## 2013-03-07 ENCOUNTER — Encounter: Payer: Self-pay | Admitting: Family Medicine

## 2013-03-07 DIAGNOSIS — E559 Vitamin D deficiency, unspecified: Secondary | ICD-10-CM | POA: Insufficient documentation

## 2013-03-19 ENCOUNTER — Other Ambulatory Visit: Payer: Self-pay | Admitting: Family Medicine

## 2013-03-19 DIAGNOSIS — K219 Gastro-esophageal reflux disease without esophagitis: Secondary | ICD-10-CM

## 2013-03-19 DIAGNOSIS — E785 Hyperlipidemia, unspecified: Secondary | ICD-10-CM

## 2013-03-20 DIAGNOSIS — K219 Gastro-esophageal reflux disease without esophagitis: Secondary | ICD-10-CM | POA: Insufficient documentation

## 2013-03-20 MED ORDER — ROSUVASTATIN CALCIUM 40 MG PO TABS: 40.0000 mg | ORAL_TABLET | Freq: Every day | ORAL | Status: DC

## 2013-03-20 MED ORDER — ESOMEPRAZOLE MAGNESIUM 40 MG PO CPDR: 40.0000 mg | DELAYED_RELEASE_CAPSULE | Freq: Every day | ORAL | Status: DC

## 2013-03-20 NOTE — Addendum Note (Signed)
Addended by: Timmothy Euler on: 03/20/2013 04:01 PM   Modules accepted: Orders

## 2013-03-20 NOTE — Telephone Encounter (Signed)
This refill request was sent to wrong office and patient now just about out of these meds - pls fill asap

## 2013-04-09 ENCOUNTER — Other Ambulatory Visit: Payer: Self-pay | Admitting: Family Medicine

## 2013-04-09 DIAGNOSIS — M109 Gout, unspecified: Secondary | ICD-10-CM

## 2013-04-09 MED ORDER — MELOXICAM 7.5 MG PO TABS: 7.5000 mg | ORAL_TABLET | Freq: Every day | ORAL | Status: DC

## 2013-04-12 ENCOUNTER — Other Ambulatory Visit: Payer: Self-pay | Admitting: *Deleted

## 2013-04-12 MED ORDER — INSULIN GLARGINE 100 UNIT/ML ~~LOC~~ SOLN: 95.0000 [IU] | Freq: Two times a day (BID) | SUBCUTANEOUS | Status: DC

## 2013-05-10 ENCOUNTER — Other Ambulatory Visit: Payer: Self-pay

## 2013-05-18 ENCOUNTER — Other Ambulatory Visit: Payer: Self-pay | Admitting: Family Medicine

## 2013-05-18 DIAGNOSIS — M549 Dorsalgia, unspecified: Secondary | ICD-10-CM

## 2013-05-18 MED ORDER — TRAMADOL HCL 50 MG PO TABS: 50.0000 mg | ORAL_TABLET | Freq: Three times a day (TID) | ORAL | Status: DC | PRN

## 2013-05-18 NOTE — Telephone Encounter (Signed)
Pt complaining of recent back strain at his wife's appointment. He and his wife are very well known to me. At visit in the hospital he is still complaining of intermittent pain and wants to wait until she is discharged top go to a visit himself. Discussed appropriate use of opiates with him and he agrees to try tramadol.   Sent Tramadol #30 R0.  Will refill if this helps him.   Laroy Apple, MD [place] Resident, PGY-2 05/18/2013, 12:37 PM

## 2013-05-20 ENCOUNTER — Other Ambulatory Visit: Payer: Self-pay | Admitting: Family Medicine

## 2013-05-25 ENCOUNTER — Other Ambulatory Visit: Payer: Self-pay | Admitting: Family Medicine

## 2013-05-29 ENCOUNTER — Other Ambulatory Visit: Payer: Self-pay | Admitting: Family Medicine

## 2013-05-29 NOTE — Telephone Encounter (Signed)
Pt is calling for refill on torsemide. He ordered this before but it was sent to the wrong pharmacy. He now use's [facility] on [address] and he would like it sent there. JW

## 2013-06-01 IMAGING — CR DG ABD PORTABLE 1V
1 series · 1 of 1 positions shown · non-contrast
Comparison: Scout image for CT scan dated 12/13/2011

CLINICAL DATA: NG tube placement. Incisional hernia.

PORTABLE ABDOMEN - 1 VIEW

[ap (kub)]
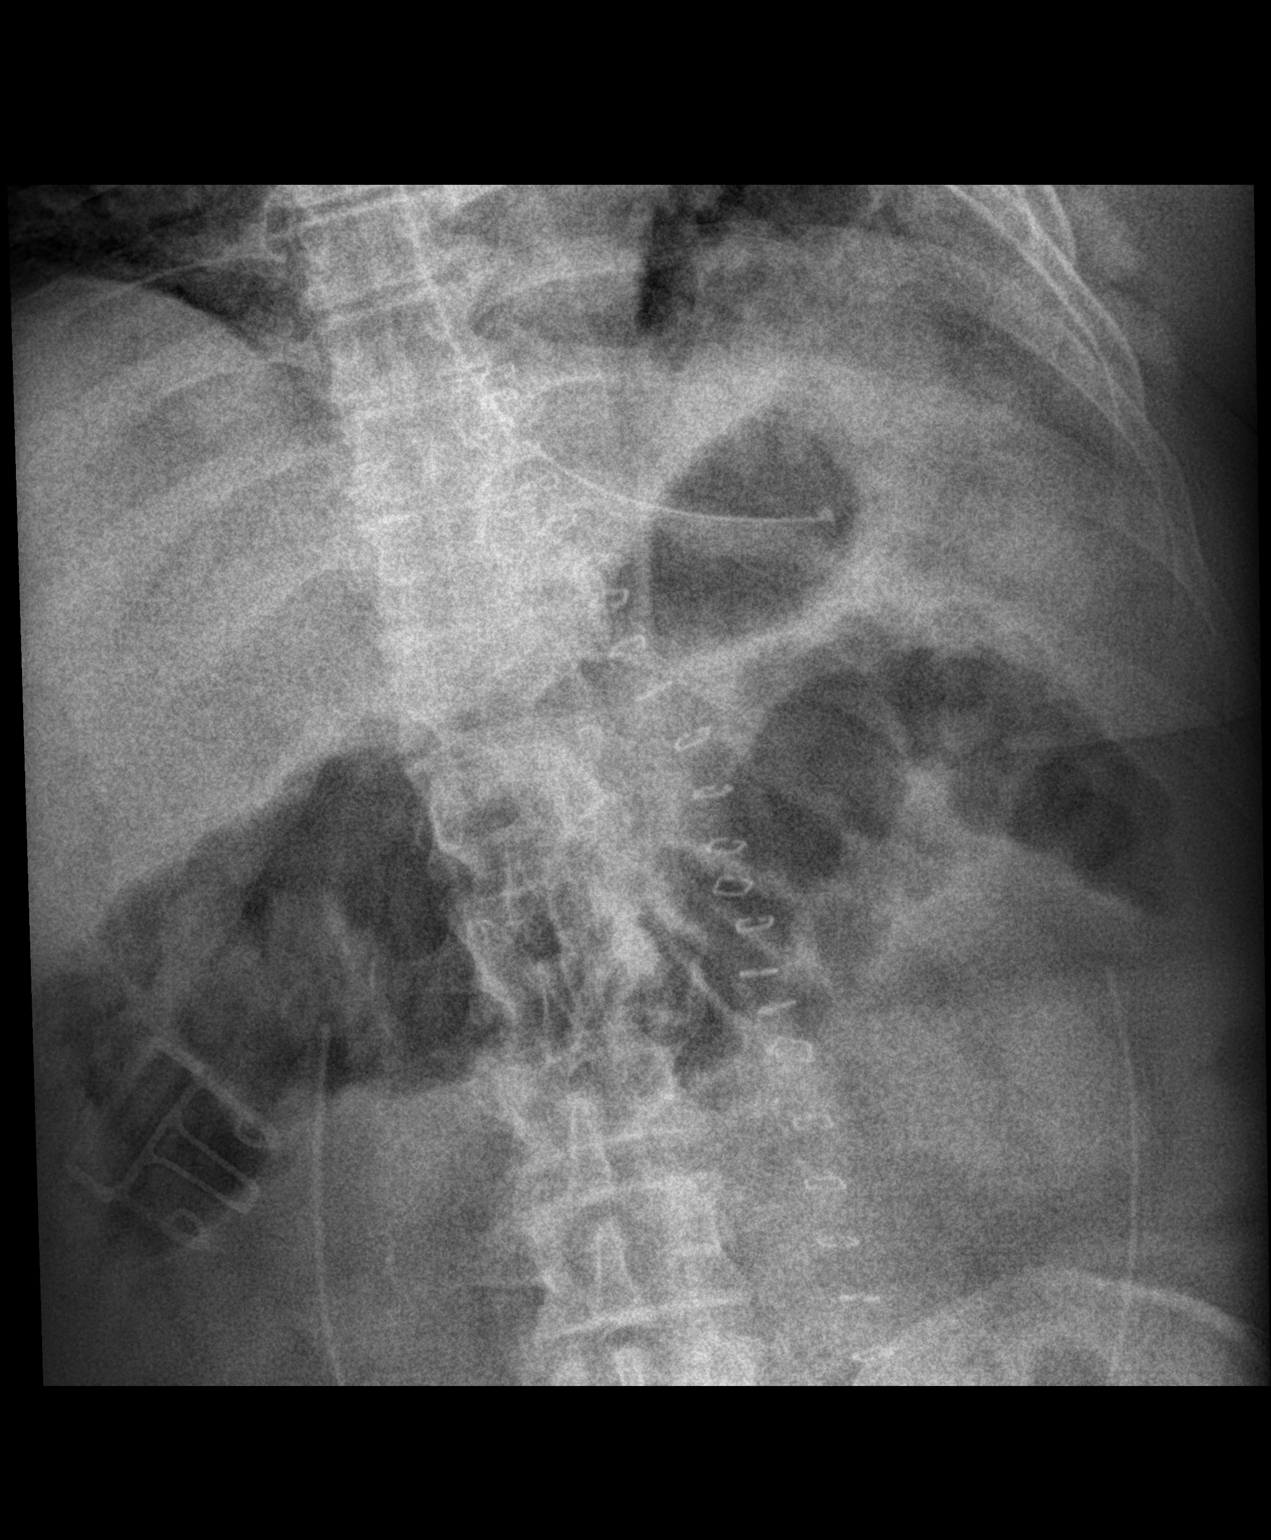

[1 of 1 positions shown; findings below may reference images not displayed]

FINDINGS: Tip of the NG tube is in the fundus of the stomach.  The
side hole is in the distal esophagus.

There is air in the nondistended transverse colon. Skin closure
staples are seen in the midline of the abdomen.  There appear to be
surgical drains in both sides of the abdomen.
IMPRESSION: NG tube tip is in the fundus of the stomach.  Side hole is in the
distal esophagus.

## 2013-06-18 ENCOUNTER — Ambulatory Visit (INDEPENDENT_AMBULATORY_CARE_PROVIDER_SITE_OTHER): Payer: PRIVATE HEALTH INSURANCE | Admitting: Family Medicine

## 2013-06-18 ENCOUNTER — Encounter: Payer: Self-pay | Admitting: Family Medicine

## 2013-06-18 VITALS — BP 149/80 | HR 60 | Ht 75.0 in | Wt 325.7 lb

## 2013-06-18 DIAGNOSIS — I10 Essential (primary) hypertension: Secondary | ICD-10-CM

## 2013-06-18 DIAGNOSIS — M549 Dorsalgia, unspecified: Secondary | ICD-10-CM

## 2013-06-18 DIAGNOSIS — E119 Type 2 diabetes mellitus without complications: Secondary | ICD-10-CM

## 2013-06-18 DIAGNOSIS — M545 Low back pain: Secondary | ICD-10-CM

## 2013-06-18 LAB — POCT GLYCOSYLATED HEMOGLOBIN (HGB A1C): Hemoglobin A1C: 8.1

## 2013-06-18 MED ORDER — TRAMADOL HCL 50 MG PO TABS: 100.0000 mg | ORAL_TABLET | Freq: Three times a day (TID) | ORAL | Status: DC | PRN

## 2013-06-18 NOTE — Assessment & Plan Note (Signed)
149/80 today but home reading considerably better,  Continue losartan 100, metoprolol, and toresemide.  Follow up 3 months

## 2013-06-18 NOTE — Patient Instructions (Signed)
Follow up in 3 months or sooner if needed.    Lets increase you tram,adol to 2 pills daily as needed and see how that works.  Your blood pressure sounds perfect.

## 2013-06-18 NOTE — Assessment & Plan Note (Addendum)
Improving with A1C 8.1 from 9.4 last time Encouraged to continue watching diet and to increase novolog to 10 TID, not to worry about a sliding scale Needs BMP and urine microalbumin next visit Continue 90 units lantus BID and increase novolog to 10 units TID Follow up 3 months

## 2013-06-18 NOTE — Progress Notes (Signed)
  Subjective:    Patient ID: Adam [name], male    DOB: [birth year], 67 y.o.   MRN: [id number]  HPI Pt here for follow up of chronic medical disease  Chronic back pain- Has been bothering him lately and tramadol helping but not completely alleviating it. He denies recent trauma, bowel or bladder dysfunction, problems walking, weakness of LE, and saddle anesthesia. His only back strain has been bending to pick peas at home.   HTN States that his BPs are always higher at the doctor's office Avg at home is 100-120/70-90 He denies headache, chest pain, dyspnea, and palpitations He is taking his meds regularly, occasionally takes 1/2 pill when his BP is low (he explains that it is occasionally 80s/60s)  DM2 Not watching diet, fastings 125-150 on average, post prandial 180-200.  Taking meds regularly, taking 5-10 units novolog depending on carb amount at meal.  Very rare hypoglycemic episodes less than once a month  Review of Systems Per HPI    Objective:   Physical Exam  Gen: NAD, alert, cooperative with exam HEENT: NCAT CV: RRR, good S1/S2, no murmur Resp: CTABL, no wheezes, non-labored Ext: No edema, warm Neuro: Alert and oriented, No gross deficits       Assessment & Plan:

## 2013-06-18 NOTE — Assessment & Plan Note (Signed)
Chronic, no red flags, likely exacerbated by workong in the garden/field picking peas Increased tramadol to 100Q 6 PRN, states he takes only 1 pill daily now Follow up as needed

## 2013-07-13 ENCOUNTER — Other Ambulatory Visit: Payer: Self-pay | Admitting: Family Medicine

## 2013-07-13 DIAGNOSIS — M109 Gout, unspecified: Secondary | ICD-10-CM

## 2013-07-13 MED ORDER — MELOXICAM 7.5 MG PO TABS: 7.5000 mg | ORAL_TABLET | Freq: Every day | ORAL | Status: DC

## 2013-08-21 ENCOUNTER — Other Ambulatory Visit: Payer: Self-pay

## 2013-08-21 MED ORDER — CILOSTAZOL 100 MG PO TABS: 100.0000 mg | ORAL_TABLET | Freq: Two times a day (BID) | ORAL | Status: DC

## 2013-08-27 ENCOUNTER — Other Ambulatory Visit: Payer: Self-pay | Admitting: Family Medicine

## 2013-08-27 MED ORDER — METOPROLOL TARTRATE 100 MG PO TABS: 100.0000 mg | ORAL_TABLET | Freq: Two times a day (BID) | ORAL | Status: DC

## 2013-09-05 ENCOUNTER — Encounter: Payer: Self-pay | Admitting: Podiatrist

## 2013-09-05 ENCOUNTER — Ambulatory Visit (INDEPENDENT_AMBULATORY_CARE_PROVIDER_SITE_OTHER): Payer: PRIVATE HEALTH INSURANCE | Admitting: Family Medicine

## 2013-09-05 ENCOUNTER — Encounter: Payer: Self-pay | Admitting: Family Medicine

## 2013-09-05 ENCOUNTER — Ambulatory Visit (INDEPENDENT_AMBULATORY_CARE_PROVIDER_SITE_OTHER): Payer: Medicare Other | Admitting: Podiatrist

## 2013-09-05 VITALS — Ht 75.0 in | Wt 323.0 lb

## 2013-09-05 VITALS — BP 148/79 | HR 55 | Ht 75.0 in | Wt 322.0 lb

## 2013-09-05 DIAGNOSIS — E119 Type 2 diabetes mellitus without complications: Secondary | ICD-10-CM

## 2013-09-05 DIAGNOSIS — M25512 Pain in left shoulder: Secondary | ICD-10-CM

## 2013-09-05 DIAGNOSIS — M25519 Pain in unspecified shoulder: Secondary | ICD-10-CM

## 2013-09-05 DIAGNOSIS — B351 Tinea unguium: Secondary | ICD-10-CM

## 2013-09-05 DIAGNOSIS — M79609 Pain in unspecified limb: Secondary | ICD-10-CM

## 2013-09-05 DIAGNOSIS — I10 Essential (primary) hypertension: Secondary | ICD-10-CM

## 2013-09-05 NOTE — Assessment & Plan Note (Signed)
Sounds like intermittent inflammation of rotator cuff Discussed injection and recommended reserving it for severe persisient pain, he agreed Continue OTCs and heat, monitor

## 2013-09-05 NOTE — Progress Notes (Signed)
  Subjective:    Patient ID: Adam [name], male    DOB: 1946-10-11, 67 y.o.   MRN: [id number]  HPI Pt here to follow up for HTN and DM2 [place] L shoulder pain  L Shoulder pain for the last 2 months, intermittent on anterior shoulder, exacerbated by activity, oral meds help some, intermittent resolving completely between pauinful periods.   HTN No HA, CP, dyspnea, palpaitatiopns, or edema Checks at home and usual readings are 120/60s, as low as 90/40 before Feels he has component of white coat HTN  DM2 Watching diet better for last 2-3 weeks, worsening diet because daughter in the hosputal lately. Declines DM diet classes Very rare hypoglycemic episodes described as sweating and weakness episodes. Recovers easily with PO food.  Takes 5-10 novolog TID with meals, 95 Units lantus BID  He is very compliant with his meds.   Review of Systems Per HPI    Objective:   Physical Exam  Gen: NAD, alert, cooperative with exam HEENT: NCAT, EOMI CV: RRR, good S1/S2, no murmur Resp: CTABL, no wheezes, non-labored Abd: SNTND, BS present, no guarding or organomegaly Ext: 1+ BL LE edema, warm Neuro: Alert and oriented, No gross deficits MSK: L shoulder non tender to palp alomng joint line, no reproduction of pain or symps with ab oradduction with resistance, full ROM     Assessment & Plan:  See problem specific A/P

## 2013-09-05 NOTE — Assessment & Plan Note (Signed)
After discussion I would like to standardize his novolog Increase to 10 TID with meals Continue current dose of lantus, 95 BID Discussed diet, 3 weeks early on A1 C and he'd like to wait, so will f/u in 1 month

## 2013-09-05 NOTE — Progress Notes (Signed)
°  Subjective:    Patient ID: Adam [name], male    DOB: 01-May-1946, 67 y.o.   MRN: [id number]  HPI Patient presents today for follow up of foot and nail care. Denies any new complaints today.  Patient is diabetic and states he is controlled with his blood sugars.     Review of Systems     Objective:   Physical Exam Objective:  Patients chart is reviewed.  Neurovascular status unchanged.  Patients nails are thickened, discolored, distrophic, friable and brittle with yellow-brown discoloration. Patient subjectively relates they are painful with shoes and with ambulation.both feet          Assessment & Plan:  Assessment:  Symptomatic onychomycosis  Plan:  Discussed treatment options and alternatives.  The symptomatic toenails were debrided through manual an mechanical means without complication.  Return appointment recommended at routine intervals of 3 months

## 2013-09-05 NOTE — Patient Instructions (Signed)
Diabetes and Foot Care Diabetes may cause you to have problems because of poor blood supply (circulation) to your feet and legs. This may cause the skin on your feet to become thinner, break easier, and heal more slowly. Your skin may become dry, and the skin may peel and crack. You may also have nerve damage in your legs and feet causing decreased feeling in them. You may not notice minor injuries to your feet that could lead to infections or more serious problems. Taking care of your feet is one of the most important things you can do for yourself.  HOME CARE INSTRUCTIONS  Wear shoes at all times, even in the house. Do not go barefoot. Bare feet are easily injured.  Check your feet daily for blisters, cuts, and redness. If you cannot see the bottom of your feet, use a mirror or ask someone for help.  Wash your feet with warm water (do not use hot water) and mild soap. Then pat your feet and the areas between your toes until they are completely dry. Do not soak your feet as this can dry your skin.  Apply a moisturizing lotion or petroleum jelly (that does not contain alcohol and is unscented) to the skin on your feet and to dry, brittle toenails. Do not apply lotion between your toes.  Trim your toenails straight across. Do not dig under them or around the cuticle. File the edges of your nails with an emery board or nail file.  Do not cut corns or calluses or try to remove them with medicine.  Wear clean socks or stockings every day. Make sure they are not too tight. Do not wear knee-high stockings since they may decrease blood flow to your legs.  Wear shoes that fit properly and have enough cushioning. To break in new shoes, wear them for just a few hours a day. This prevents you from injuring your feet. Always look in your shoes before you put them on to be sure there are no objects inside.  Do not cross your legs. This may decrease the blood flow to your feet.  If you find a minor scrape,  cut, or break in the skin on your feet, keep it and the skin around it clean and dry. These areas may be cleansed with mild soap and water. Do not cleanse the area with peroxide, alcohol, or iodine.  When you remove an adhesive bandage, be sure not to damage the skin around it.  If you have a wound, look at it several times a day to make sure it is healing.  Do not use heating pads or hot water bottles. They may burn your skin. If you have lost feeling in your feet or legs, you may not know it is happening until it is too late.  Make sure your health care provider performs a complete foot exam at least annually or more often if you have foot problems. Report any cuts, sores, or bruises to your health care provider immediately. SEEK MEDICAL CARE IF:   You have an injury that is not healing.  You have cuts or breaks in the skin.  You have an ingrown nail.  You notice redness on your legs or feet.  You feel burning or tingling in your legs or feet.  You have pain or cramps in your legs and feet.  Your legs or feet are numb.  Your feet always feel cold. SEEK IMMEDIATE MEDICAL CARE IF:   There is increasing redness,   swelling, or pain in or around a wound.  There is a red line that goes up your leg.  Pus is coming from a wound.  You develop a fever or as directed by your health care provider.  You notice a bad smell coming from an ulcer or wound. Document Released: 10/15/2000 Document Revised: 06/20/2013 Document Reviewed: 03/27/2013 [employer] Patient Information 2014 [employer].  

## 2013-09-05 NOTE — Patient Instructions (Signed)
Follow up in 1 month  Shoulder Pain The shoulder is the joint that connects your arms to your body. The bones that form the shoulder joint include the upper arm bone (humerus), the shoulder blade (scapula), and the collarbone (clavicle). The top of the humerus is shaped like a ball and fits into a rather flat socket on the scapula (glenoid cavity). A combination of muscles and strong, fibrous tissues that connect muscles to bones (tendons) support your shoulder joint and hold the ball in the socket. Small, fluid-filled sacs (bursae) are located in different areas of the joint. They act as cushions between the bones and the overlying soft tissues and help reduce friction between the gliding tendons and the bone as you move your arm. Your shoulder joint allows a wide range of motion in your arm. This range of motion allows you to do things like scratch your back or throw a ball. However, this range of motion also makes your shoulder more prone to pain from overuse and injury. Causes of shoulder pain can originate from both injury and overuse and usually can be grouped in the following four categories:  Redness, swelling, and pain (inflammation) of the tendon (tendinitis) or the bursae (bursitis).  Instability, such as a dislocation of the joint.  Inflammation of the joint (arthritis).  Broken bone (fracture). HOME CARE INSTRUCTIONS   Apply ice to the sore area.  Put ice in a plastic bag.  Place a towel between your skin and the bag.  Leave the ice on for 15-20 minutes, 03-04 times per day for the first 2 days.  Stop using cold packs if they do not help with the pain.  If you have a shoulder sling or immobilizer, wear it as long as your caregiver instructs. Only remove it to shower or bathe. Move your arm as little as possible, but keep your hand moving to prevent swelling.  Squeeze a soft ball or foam pad as much as possible to help prevent swelling.  Only take over-the-counter or  prescription medicines for pain, discomfort, or fever as directed by your caregiver. SEEK MEDICAL CARE IF:   Your shoulder pain increases, or new pain develops in your arm, hand, or fingers.  Your hand or fingers become cold and numb.  Your pain is not relieved with medicines. SEEK IMMEDIATE MEDICAL CARE IF:   Your arm, hand, or fingers are numb or tingling.  Your arm, hand, or fingers are significantly swollen or turn white or blue. MAKE SURE YOU:   Understand these instructions.  Will watch your condition.  Will get help right away if you are not doing well or get worse. Document Released: 07/28/2005 Document Revised: 07/12/2012 Document Reviewed: 10/02/2011 [facility] Patient Information 2014 [employer].

## 2013-09-05 NOTE — Assessment & Plan Note (Signed)
Elevated on arrival, repeat still elevated to 161/85 Reports good pressures at home, some lows making me hesitant to increase meds Requested logging pressures Possible component of white coat HTN Consider amlodipine if logged pressures elevated.

## 2013-09-21 ENCOUNTER — Other Ambulatory Visit: Payer: Self-pay | Admitting: Family Medicine

## 2013-09-21 DIAGNOSIS — M109 Gout, unspecified: Secondary | ICD-10-CM

## 2013-09-21 DIAGNOSIS — N492 Inflammatory disorders of scrotum: Secondary | ICD-10-CM

## 2013-09-21 MED ORDER — MELOXICAM 7.5 MG PO TABS: 7.5000 mg | ORAL_TABLET | Freq: Every day | ORAL | Status: DC

## 2013-09-26 ENCOUNTER — Other Ambulatory Visit: Payer: Self-pay | Admitting: Family Medicine

## 2013-09-26 DIAGNOSIS — E119 Type 2 diabetes mellitus without complications: Secondary | ICD-10-CM

## 2013-09-26 MED ORDER — INSULIN ASPART 100 UNIT/ML ~~LOC~~ SOLN: 10.0000 [IU] | Freq: Three times a day (TID) | SUBCUTANEOUS | Status: DC

## 2013-09-26 NOTE — Telephone Encounter (Signed)
Refilled Novolog, R 11.   Laroy Apple, MD [place] Resident, PGY-2 09/26/2013, 8:01 AM

## 2013-10-02 ENCOUNTER — Ambulatory Visit (INDEPENDENT_AMBULATORY_CARE_PROVIDER_SITE_OTHER): Payer: PRIVATE HEALTH INSURANCE | Admitting: Cardiovascular Disease

## 2013-10-02 ENCOUNTER — Encounter: Payer: Self-pay | Admitting: Cardiovascular Disease

## 2013-10-02 ENCOUNTER — Ambulatory Visit: Payer: PRIVATE HEALTH INSURANCE | Admitting: Cardiovascular Disease

## 2013-10-02 VITALS — BP 134/80 | HR 58 | Ht 75.0 in | Wt 326.2 lb

## 2013-10-02 DIAGNOSIS — I2510 Atherosclerotic heart disease of native coronary artery without angina pectoris: Secondary | ICD-10-CM

## 2013-10-02 DIAGNOSIS — I7389 Other specified peripheral vascular diseases: Secondary | ICD-10-CM

## 2013-10-02 DIAGNOSIS — I739 Peripheral vascular disease, unspecified: Secondary | ICD-10-CM

## 2013-10-02 NOTE — Patient Instructions (Signed)
Your physician has requested that you have a lower extremity arterial doppler. This test is an ultrasound of the arteries in the legs. It looks at arterial blood flow in the legs. Allow one hour for Lower Arterial scans. There are no restrictions or special instructions.  Your physician recommends that you continue on your current medications as directed. Please refer to the Current Medication list given to you today.  Your physician wants you to follow-up in: 1 YEAR with Dr Fletcher Anon.  You will receive a reminder letter in the mail two months in advance. If you don't receive a letter, please call our office to schedule the follow-up appointment.
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
## 2013-10-06 NOTE — Assessment & Plan Note (Signed)
ABI was moderately reduced bilaterally at 0.6 with evidence of chronic occlusion of the right SFA with significant popliteal disease as well as significant left SFA stenosis. He has stable claudication symptoms and currently these are not lifestyle limiting. His symptoms are well-controlled with  Pletal. Continue medical therapy. No indication for revascularization at this time. I will obtain an ABI/lower extremity Doppler to ensure stability. Followup on a yearly basis or earlier if needed.

## 2013-10-08 ENCOUNTER — Encounter: Payer: Self-pay | Admitting: Cardiology

## 2013-10-08 ENCOUNTER — Ambulatory Visit (HOSPITAL_COMMUNITY): Payer: PRIVATE HEALTH INSURANCE | Attending: Cardiology

## 2013-10-08 DIAGNOSIS — I743 Embolism and thrombosis of arteries of the lower extremities: Secondary | ICD-10-CM | POA: Insufficient documentation

## 2013-10-08 DIAGNOSIS — I739 Peripheral vascular disease, unspecified: Secondary | ICD-10-CM | POA: Insufficient documentation

## 2013-10-08 DIAGNOSIS — Z87891 Personal history of nicotine dependence: Secondary | ICD-10-CM | POA: Insufficient documentation

## 2013-10-08 DIAGNOSIS — I2510 Atherosclerotic heart disease of native coronary artery without angina pectoris: Secondary | ICD-10-CM | POA: Insufficient documentation

## 2013-10-08 DIAGNOSIS — E119 Type 2 diabetes mellitus without complications: Secondary | ICD-10-CM | POA: Insufficient documentation

## 2013-10-08 DIAGNOSIS — I10 Essential (primary) hypertension: Secondary | ICD-10-CM | POA: Insufficient documentation

## 2013-10-08 DIAGNOSIS — E785 Hyperlipidemia, unspecified: Secondary | ICD-10-CM | POA: Insufficient documentation

## 2013-10-10 ENCOUNTER — Encounter: Payer: Self-pay | Admitting: Family Medicine

## 2013-10-10 ENCOUNTER — Telehealth: Payer: Self-pay | Admitting: Family Medicine

## 2013-10-10 NOTE — Telephone Encounter (Addendum)
Filled out paper request for insoles from your foot friend in [place]. On reviewing this I dont remember the patient requesting these and they are geographically far from his house. I am not sure he is actually a patient of there's. I will request from them that teh patient call and request insoles from them if he wants them.   Laroy Apple, MD [place] Resident, PGY-2 10/10/2013, 1:45 PM

## 2013-10-11 ENCOUNTER — Telehealth: Payer: Self-pay | Admitting: Family Medicine

## 2013-10-11 ENCOUNTER — Telehealth: Payer: Self-pay | Admitting: Licensed Clinical Social Worker

## 2013-10-11 NOTE — Telephone Encounter (Signed)
Pt called because he needs diabetic shoes. He said that a new place has sent [place] a request but we have not responded. jw

## 2013-10-11 NOTE — Telephone Encounter (Signed)
Forward to PCP, do you know anything about the diabetic shoe request.Jasilyn Holderman, Kevin Fenton

## 2013-10-11 NOTE — Telephone Encounter (Signed)
Patients insurance is requiring a lipid panel drawn at next office visit. I am working off of a list for [facility].

## 2013-10-12 NOTE — Telephone Encounter (Signed)
I appreciate knowing that the patient requested this, Will send paperwork. I do believe it is appropriate.   Laroy Apple, MD [place] Resident, PGY-2 10/12/2013, 8:17 AM

## 2013-10-15 ENCOUNTER — Telehealth: Payer: Self-pay | Admitting: Family Medicine

## 2013-10-15 NOTE — Telephone Encounter (Signed)
Will fwd. To PCP. See previous message. Thanks. Javier Glazier, Gerrit Heck

## 2013-10-15 NOTE — Telephone Encounter (Signed)
Pt called and would like DR. Wendi Snipes to call him concerning his diabetic papers he needs to fax. jw

## 2013-10-15 NOTE — Telephone Encounter (Signed)
Called and discussed. He was wondering if I sent his request/approval for DM footwear. It was sent 3 days ago.   Laroy Apple, MD [place] Resident, PGY-2 10/15/2013, 1:31 PM

## 2013-10-23 ENCOUNTER — Other Ambulatory Visit: Payer: Self-pay | Admitting: Family Medicine

## 2013-10-23 MED ORDER — INSULIN GLARGINE 100 UNIT/ML ~~LOC~~ SOLN: 95.0000 [IU] | Freq: Two times a day (BID) | SUBCUTANEOUS | Status: DC

## 2013-10-29 ENCOUNTER — Other Ambulatory Visit: Payer: Self-pay | Admitting: Family Medicine

## 2013-10-29 MED ORDER — METFORMIN HCL 500 MG PO TABS: 500.0000 mg | ORAL_TABLET | Freq: Every day | ORAL | Status: DC

## 2013-11-19 ENCOUNTER — Telehealth: Payer: Self-pay | Admitting: *Deleted

## 2013-11-19 NOTE — Telephone Encounter (Signed)
Pharmacy called stating they no longer carry the metoprolol 100mg  tablet. Per Dr. Wendi Snipes ok to switch to 50 mg tablet two by mouth BID, called and informed pharmacy.

## 2013-11-20 ENCOUNTER — Other Ambulatory Visit: Payer: Self-pay | Admitting: *Deleted

## 2013-11-20 MED ORDER — CILOSTAZOL 100 MG PO TABS: 100.0000 mg | ORAL_TABLET | Freq: Two times a day (BID) | ORAL | Status: DC

## 2013-12-06 ENCOUNTER — Encounter: Payer: Self-pay | Admitting: Podiatrist

## 2013-12-06 ENCOUNTER — Ambulatory Visit (INDEPENDENT_AMBULATORY_CARE_PROVIDER_SITE_OTHER): Payer: Medicare PPO | Admitting: Podiatrist

## 2013-12-06 VITALS — BP 158/86 | HR 64 | Resp 12

## 2013-12-06 DIAGNOSIS — B351 Tinea unguium: Secondary | ICD-10-CM

## 2013-12-06 DIAGNOSIS — M79609 Pain in unspecified limb: Secondary | ICD-10-CM

## 2013-12-06 NOTE — Progress Notes (Signed)
''   toenails trim.'' HPI: Patient presents today for follow up of diabetic foot and nail care. Past medical history, meds, and allergies reviewed.   Objective:  Neurovascular status unchanged with palpable pedal pulses at 2/4 dp and pt bilateral and neurological sensation intact.  Patients nails are elongated, thickened, discolored, dystrophic with ingrown deformity present.    Assessment: Diabetes , symptomatic mycotic toenails   Plan: Discussed treatment options and alternatives. Debrided nails without complication. .  Return appointment recommended at routine intervals of 3 months.   Trudie Buckler, DPM

## 2013-12-07 ENCOUNTER — Encounter: Payer: Self-pay | Admitting: *Deleted

## 2013-12-07 NOTE — Telephone Encounter (Signed)
This encounter was created in error - please disregard.

## 2013-12-09 MED ORDER — INSULIN PEN NEEDLE 31G X 8 MM MISC: 1.0000 | Freq: Every day | Status: DC

## 2013-12-12 ENCOUNTER — Other Ambulatory Visit: Payer: Self-pay | Admitting: *Deleted

## 2013-12-13 ENCOUNTER — Telehealth: Payer: Self-pay | Admitting: Home Health Services

## 2013-12-13 MED ORDER — INSULIN PEN NEEDLE 31G X 8 MM MISC: 1.0000 | Freq: Every day | Status: DC

## 2013-12-13 NOTE — Telephone Encounter (Signed)
LM for patient to call back to schedule 60 min awv with Vinnie Level.

## 2013-12-14 ENCOUNTER — Other Ambulatory Visit: Payer: Self-pay | Admitting: Family Medicine

## 2013-12-14 DIAGNOSIS — IMO0002 Reserved for concepts with insufficient information to code with codable children: Secondary | ICD-10-CM

## 2013-12-14 DIAGNOSIS — E1165 Type 2 diabetes mellitus with hyperglycemia: Secondary | ICD-10-CM

## 2013-12-14 MED ORDER — "INSULIN SYRINGE 30G X 1/2"" 0.5 ML MISC": 1.0000 | Status: DC | PRN

## 2014-01-03 ENCOUNTER — Other Ambulatory Visit: Payer: Self-pay | Admitting: *Deleted

## 2014-01-03 DIAGNOSIS — M109 Gout, unspecified: Secondary | ICD-10-CM

## 2014-01-03 MED ORDER — MELOXICAM 7.5 MG PO TABS: 7.5000 mg | ORAL_TABLET | Freq: Every day | ORAL | Status: DC

## 2014-01-04 ENCOUNTER — Telehealth: Payer: Self-pay | Admitting: *Deleted

## 2014-01-04 DIAGNOSIS — I10 Essential (primary) hypertension: Secondary | ICD-10-CM

## 2014-01-04 MED ORDER — LISINOPRIL 40 MG PO TABS: 40.0000 mg | ORAL_TABLET | Freq: Every day | ORAL | Status: DC

## 2014-01-04 NOTE — Telephone Encounter (Signed)
Will change him from ARB to ACE per medicaid's request for PDL.   Will ask team to notify.   Laroy Apple, MD [place] Resident, PGY-2 01/04/2014, 1:58 PM

## 2014-01-04 NOTE — Telephone Encounter (Signed)
Prior Authorization received from [facility] for Losartan 100 mg tab. Formulary and PA form placed in provider box for completion. Derl Barrow, RN

## 2014-01-08 ENCOUNTER — Encounter: Payer: Self-pay | Admitting: Family Medicine

## 2014-01-08 ENCOUNTER — Ambulatory Visit (INDEPENDENT_AMBULATORY_CARE_PROVIDER_SITE_OTHER): Payer: Medicare Other | Admitting: Family Medicine

## 2014-01-08 VITALS — BP 166/84 | HR 58 | Ht 75.0 in | Wt 329.4 lb

## 2014-01-08 DIAGNOSIS — E1165 Type 2 diabetes mellitus with hyperglycemia: Secondary | ICD-10-CM

## 2014-01-08 DIAGNOSIS — IMO0002 Reserved for concepts with insufficient information to code with codable children: Secondary | ICD-10-CM

## 2014-01-08 DIAGNOSIS — N498 Inflammatory disorders of other specified male genital organs: Secondary | ICD-10-CM

## 2014-01-08 DIAGNOSIS — E785 Hyperlipidemia, unspecified: Secondary | ICD-10-CM

## 2014-01-08 DIAGNOSIS — I10 Essential (primary) hypertension: Secondary | ICD-10-CM

## 2014-01-08 DIAGNOSIS — IMO0001 Reserved for inherently not codable concepts without codable children: Secondary | ICD-10-CM

## 2014-01-08 DIAGNOSIS — N492 Inflammatory disorders of scrotum: Secondary | ICD-10-CM

## 2014-01-08 LAB — COMPREHENSIVE METABOLIC PANEL
ALBUMIN: 3.9 g/dL (ref 3.5–5.2)
ALT: 16 U/L (ref 0–53)
AST: 20 U/L (ref 0–37)
Alkaline Phosphatase: 70 U/L (ref 39–117)
BUN: 18 mg/dL (ref 6–23)
CALCIUM: 9.4 mg/dL (ref 8.4–10.5)
CHLORIDE: 103 meq/L (ref 96–112)
CO2: 27 meq/L (ref 19–32)
CREATININE: 1.28 mg/dL (ref 0.50–1.35)
GLUCOSE: 132 mg/dL — AB (ref 70–99)
POTASSIUM: 4.4 meq/L (ref 3.5–5.3)
Sodium: 139 mEq/L (ref 135–145)
Total Bilirubin: 0.7 mg/dL (ref 0.2–1.2)
Total Protein: 7 g/dL (ref 6.0–8.3)

## 2014-01-08 LAB — CBC WITH DIFFERENTIAL/PLATELET
Basophils Absolute: 0.1 10*3/uL (ref 0.0–0.1)
Basophils Relative: 1 % (ref 0–1)
EOS PCT: 3 % (ref 0–5)
Eosinophils Absolute: 0.2 10*3/uL (ref 0.0–0.7)
HCT: 42.8 % (ref 39.0–52.0)
HEMOGLOBIN: 14.3 g/dL (ref 13.0–17.0)
LYMPHS PCT: 27 % (ref 12–46)
Lymphs Abs: 1.9 10*3/uL (ref 0.7–4.0)
MCH: 27.2 pg (ref 26.0–34.0)
MCHC: 33.4 g/dL (ref 30.0–36.0)
MCV: 81.4 fL (ref 78.0–100.0)
MONOS PCT: 10 % (ref 3–12)
Monocytes Absolute: 0.7 10*3/uL (ref 0.1–1.0)
NEUTROS ABS: 4.1 10*3/uL (ref 1.7–7.7)
Neutrophils Relative %: 59 % (ref 43–77)
Platelets: 232 10*3/uL (ref 150–400)
RBC: 5.26 MIL/uL (ref 4.22–5.81)
RDW: 16.8 % — AB (ref 11.5–15.5)
WBC: 7 10*3/uL (ref 4.0–10.5)

## 2014-01-08 LAB — POCT GLYCOSYLATED HEMOGLOBIN (HGB A1C): HEMOGLOBIN A1C: 9.4

## 2014-01-08 NOTE — Assessment & Plan Note (Signed)
Check lipid panel, continue Crestor

## 2014-01-08 NOTE — Assessment & Plan Note (Signed)
Still draining, still working with urology in planning an additional surgery in the next 2 months

## 2014-01-08 NOTE — Assessment & Plan Note (Signed)
Reasonable control today Continue lisinopril, metoprolol, torsemide Had a change losartan 100 lisinopril 40 mg due to formulary changes

## 2014-01-08 NOTE — Patient Instructions (Signed)
It was great to see you !  PLease continue to watch your diet!  Start novolog 5 units three times daily.   Diet Recommendations for Diabetes   Starchy (carb) foods include: Bread, rice, pasta, potatoes, corn, crackers, bagels, muffins, all baked goods.   Protein foods include: Meat, fish, poultry, eggs, dairy foods, and beans such as pinto and kidney beans (beans also provide carbohydrate).   1. Eat at least 3 meals and 1-2 snacks per day. Never go more than 4-5 hours while awake without eating.  2. Limit starchy foods to TWO per meal and ONE per snack. ONE portion of a starchy  food is equal to the following:   - ONE slice of bread (or its equivalent, such as half of a hamburger bun).   - 1/2 cup of a "scoopable" starchy food such as potatoes or rice.   - 1 OUNCE (28 grams) of starchy snack foods such as crackers or pretzels (look on label).   - 15 grams of carbohydrate as shown on food label.  3. Both lunch and dinner should include a protein food, a carb food, and vegetables.   - Obtain twice as many veg's as protein or carbohydrate foods for both lunch and dinner.   - Try to keep frozen veg's on hand for a quick vegetable serving.     - Fresh or frozen veg's are best.  4. Breakfast should always include protein.

## 2014-01-08 NOTE — Assessment & Plan Note (Addendum)
A1c worsened to 9.6 Discussed standard arising NovoLog again, take 5 units 3 times a day with meals Continue current dose of Lantus, 95 twice a day He is improved his carb intake, encouraged that to continue Followup A1c 3 months Also has diabetic neuropathy and likely needs diabetic shoes vs orthotics, has seen podiatry

## 2014-01-08 NOTE — Progress Notes (Signed)
Patient ID: Adam [name], male   DOB: 1946/02/13, 68 y.o.   MRN: [id number]  Kenn File, MD Phone: [phone]  Subjective:  Chief complaint-noted  # Patient here for followup of diabetes, hypertension  Diabetes Fasting blood sugar average 150 ranges from 110-200 Reports good medication compliance, taking 95 units of Lantus twice a day and 5 units of NovoLog 1-2 times daily approximately 5 days a week  Hypertension Checks at home and states that it is average blood pressure is 110-140/60-70 Denies headache, chest pain, dyspnea, palpitations, and swelling States that he has heart failure and sees a cardiologist for this, has good urinary response to torsemide   ROS- Per HPI  Past Medical History Patient Active Problem List   Diagnosis Date Noted  . Pain in joint, shoulder region 09/05/2013  . Back pain 05/18/2013  . GERD (gastroesophageal reflux disease) 03/20/2013  . Unspecified vitamin D deficiency 03/07/2013  . Embedded toenail 08/04/2012  . Scrotum, abscess 08/04/2012  . Preop cardiovascular exam 03/03/2012  . Ventral hernia 10/06/2011  . Localized swelling, mass, or lump of lower extremity 05/16/2011  . OBSTRUCTIVE SLEEP APNEA 02/10/2010  . SLEEP APNEA 12/30/2009  . FATIGUE, CHRONIC 12/30/2009  . PERIPHERAL VASCULAR DISEASE WITH CLAUDICATION 03/05/2009  . ABDOMINAL AORTIC ANEURYSM REPAIR, HX OF 03/05/2009  . OBESITY 01/01/2009  . Chronic kidney disease (CKD), stage III (moderate) 01/12/2008  . BACK PAIN, LUMBAR 09/05/2007  . PROSTATE CANCER 12/29/2006  . Diabetes mellitus out of control 12/29/2006  . NEUROPATHY, DIABETIC 12/29/2006  . HYPERLIPIDEMIA 12/29/2006  . Gouty arthropathy 12/29/2006  . IMPOTENCE INORGANIC 12/29/2006  . HYPERTENSION, BENIGN SYSTEMIC 12/29/2006  . MYOCARDIAL INFARCTION, OLD 12/29/2006  . CORONARY, ARTERIOSCLEROSIS 12/29/2006  . CLAUDICATION, INTERMITTENT 12/29/2006  . DIVERTICULITIS OF COLON, NOS 12/29/2006  . ARTHRITIS  12/29/2006    Medications- reviewed and updated Current Outpatient Prescriptions  Medication Sig Dispense Refill  . aspirin (BAYER CHILDRENS ASPIRIN) 81 MG chewable tablet Chew 81 mg by mouth daily.       . cilostazol (PLETAL) 100 MG tablet Take 1 tablet (100 mg total) by mouth 2 (two) times daily.  60 tablet  10  . doxycycline (ADOXA) 100 MG tablet Take 100 mg by mouth 2 (two) times daily.      Marland Kitchen esomeprazole (NEXIUM) 40 MG capsule Take 1 capsule (40 mg total) by mouth daily before breakfast.  30 capsule  11  . insulin aspart (NOVOLOG) 100 UNIT/ML injection Inject 10 Units into the skin 3 (three) times daily with meals. Dispense 1 month supply.      . insulin glargine (LANTUS) 100 UNIT/ML injection Inject 0.95 mLs (95 Units total) into the skin 2 (two) times daily.  10 mL  5  . Insulin Pen Needle (B-D ULTRAFINE III SHORT PEN) 31G X 8 MM MISC 1 Syringe by Does not apply route 5 (five) times daily.  200 each  12  . Insulin Syringe-Needle U-100 (INSULIN SYRINGE .5CC/30GX1/2") 30G X 1/2" 0.5 ML MISC 1 Device by Does not apply route as needed.  100 each  12  . lisinopril (PRINIVIL,ZESTRIL) 40 MG tablet Take 1 tablet (40 mg total) by mouth daily.  90 tablet  3  . meloxicam (MOBIC) 7.5 MG tablet Take 1 tablet (7.5 mg total) by mouth daily.  30 tablet  2  . metFORMIN (GLUCOPHAGE) 500 MG tablet Take 1 tablet (500 mg total) by mouth daily with breakfast.  180 tablet  3  . metoprolol (LOPRESSOR) 100 MG tablet Take 1 tablet (100 mg  total) by mouth 2 (two) times daily.  60 tablet  11  . NON FORMULARY Insulin syringe.      . rosuvastatin (CRESTOR) 40 MG tablet Take 1 tablet (40 mg total) by mouth at bedtime.  30 tablet  11  . torsemide (DEMADEX) 20 MG tablet take 1/2 tablet by mouth once daily  30 tablet  3  . traMADol (ULTRAM) 50 MG tablet Take 2 tablets (100 mg total) by mouth every 8 (eight) hours as needed for pain.  90 tablet  2   No current facility-administered medications for this visit.     Objective: BP 166/84  Pulse 58  Ht 6\' 3"  (1.905 m)  Wt 329 lb 6.4 oz (149.415 kg)  BMI 41.17 kg/m2 Gen: NAD, alert, cooperative with exam HEENT: NCAT CV: RRR, good S1/S2, no murmur Resp: CTABL, no wheezes, non-labored Ext: No edema, warm Neuro: Alert and oriented, No gross deficits  Diabetic foot exam: No lesions, sensation intact throughout to monofilament, 2+ DP pulses     Assessment/Plan:  Diabetes mellitus out of control A1c worsened to 9.6 Discussed standard arising NovoLog again, take 5 units 3 times a day with meals Continue current dose of Lantus, 95 twice a day He is improved his carb intake, encouraged that to continue Followup A1c 3 months  HYPERLIPIDEMIA Check lipid panel, continue Crestor  HYPERTENSION, BENIGN SYSTEMIC Reasonable control today Continue lisinopril, metoprolol, torsemide Had a change losartan 100 lisinopril 40 mg due to formulary changes  Scrotum, abscess Still draining, still working with urology in planning an additional surgery in the next 2 months    Orders Placed This Encounter  Procedures  . CBC with Differential  . Comprehensive metabolic panel  . Lipid panel    Standing Status: Future     Number of Occurrences:      Standing Expiration Date: 01/08/2015  . POCT glycosylated hemoglobin (Hb A1C)    No orders of the defined types were placed in this encounter.

## 2014-01-09 ENCOUNTER — Other Ambulatory Visit: Payer: Self-pay | Admitting: Family Medicine

## 2014-01-09 ENCOUNTER — Telehealth: Payer: Self-pay | Admitting: Family Medicine

## 2014-01-09 DIAGNOSIS — E785 Hyperlipidemia, unspecified: Secondary | ICD-10-CM

## 2014-01-09 DIAGNOSIS — K219 Gastro-esophageal reflux disease without esophagitis: Secondary | ICD-10-CM

## 2014-01-09 DIAGNOSIS — I10 Essential (primary) hypertension: Secondary | ICD-10-CM

## 2014-01-09 DIAGNOSIS — M109 Gout, unspecified: Secondary | ICD-10-CM

## 2014-01-09 MED ORDER — CILOSTAZOL 100 MG PO TABS: 100.0000 mg | ORAL_TABLET | Freq: Two times a day (BID) | ORAL | Status: DC

## 2014-01-09 MED ORDER — METFORMIN HCL 500 MG PO TABS: 500.0000 mg | ORAL_TABLET | Freq: Every day | ORAL | Status: DC

## 2014-01-09 MED ORDER — TORSEMIDE 20 MG PO TABS: 20.0000 mg | ORAL_TABLET | Freq: Every day | ORAL | Status: DC

## 2014-01-09 MED ORDER — MELOXICAM 7.5 MG PO TABS: 7.5000 mg | ORAL_TABLET | Freq: Every day | ORAL | Status: DC

## 2014-01-09 MED ORDER — LISINOPRIL 40 MG PO TABS: 40.0000 mg | ORAL_TABLET | Freq: Every day | ORAL | Status: DC

## 2014-01-09 MED ORDER — ROSUVASTATIN CALCIUM 40 MG PO TABS: 40.0000 mg | ORAL_TABLET | Freq: Every day | ORAL | Status: DC

## 2014-01-09 MED ORDER — INSULIN ASPART 100 UNIT/ML ~~LOC~~ SOLN: 10.0000 [IU] | Freq: Three times a day (TID) | SUBCUTANEOUS | Status: DC

## 2014-01-09 MED ORDER — INSULIN GLARGINE 100 UNIT/ML ~~LOC~~ SOLN: 95.0000 [IU] | Freq: Two times a day (BID) | SUBCUTANEOUS | Status: DC

## 2014-01-09 MED ORDER — METOPROLOL TARTRATE 100 MG PO TABS: 100.0000 mg | ORAL_TABLET | Freq: Two times a day (BID) | ORAL | Status: DC

## 2014-01-09 MED ORDER — ESOMEPRAZOLE MAGNESIUM 40 MG PO CPDR: 40.0000 mg | DELAYED_RELEASE_CAPSULE | Freq: Every day | ORAL | Status: DC

## 2014-01-09 NOTE — Telephone Encounter (Addendum)
Refilled meds at new pharmacy, will ask staff to inform him.  Laroy Apple, MD [place] Resident, PGY-2 01/09/2014, 9:02 PM   Also he and his wife's recent labs were normal.   Laroy Apple, MD [place] Resident, PGY-2 01/09/2014, 9:04 PM

## 2014-01-09 NOTE — Telephone Encounter (Signed)
Pt called because the online pharmacy he was using will not feel his medication and diabetic supplies because his insurance will not cover through this online pharmacy. He now needs [place] to call in all his medications and supplies to the [facility] on [address]. He is out of everything because he was waiting on them to mail it to him and just found out today that it wasn't going to be sent. jw

## 2014-01-09 NOTE — Telephone Encounter (Signed)
Will fwd to PCP.  Karmina Zufall L, CMA  

## 2014-01-10 ENCOUNTER — Telehealth: Payer: Self-pay | Admitting: *Deleted

## 2014-01-10 NOTE — Telephone Encounter (Signed)
Called pt.LMVM to call back. Please see message. Thanks. .Adam [name]  

## 2014-01-10 NOTE — Telephone Encounter (Signed)
Prior Authorization received from [employer] for Crestor 40 mg tab and Nexium Dr 40 mg tab. Formulary and PA form placed in provider box for completion. Derl Barrow, RN

## 2014-01-11 ENCOUNTER — Telehealth: Payer: Self-pay | Admitting: Family Medicine

## 2014-01-11 DIAGNOSIS — E1165 Type 2 diabetes mellitus with hyperglycemia: Secondary | ICD-10-CM

## 2014-01-11 DIAGNOSIS — K219 Gastro-esophageal reflux disease without esophagitis: Secondary | ICD-10-CM

## 2014-01-11 DIAGNOSIS — IMO0002 Reserved for concepts with insufficient information to code with codable children: Secondary | ICD-10-CM

## 2014-01-11 DIAGNOSIS — E785 Hyperlipidemia, unspecified: Secondary | ICD-10-CM

## 2014-01-11 MED ORDER — ATORVASTATIN CALCIUM 80 MG PO TABS: 80.0000 mg | ORAL_TABLET | Freq: Every day | ORAL | Status: DC

## 2014-01-11 MED ORDER — ONETOUCH ULTRA SYSTEM W/DEVICE KIT: 1.0000 | PACK | Freq: Once | Status: AC

## 2014-01-11 MED ORDER — GLUCOSE BLOOD VI STRP: ORAL_STRIP | Status: DC

## 2014-01-11 MED ORDER — PANTOPRAZOLE SODIUM 40 MG PO TBEC: 40.0000 mg | DELAYED_RELEASE_TABLET | Freq: Every day | ORAL | Status: DC

## 2014-01-11 NOTE — Telephone Encounter (Signed)
See newer phone note, called and discussed, changes made due to formulary  Laroy Apple, MD [place] Resident, PGY-2 01/11/2014, 2:03 PM

## 2014-01-11 NOTE — Telephone Encounter (Signed)
Patients insurance is not longer covering the kind of test strips that he is using.  He is out and needs a prescription for the kind that will be covered plus a new meter.  Placed paper in dr's box explaining the type that will be covered.  Please call him when ready to be picked up.

## 2014-01-11 NOTE — Telephone Encounter (Signed)
Discussed with patient changes for formulary request. Prescribed lipitor in place of crestor, protonix in place of nexium, and Onetouch meter with strips.   Laroy Apple, MD [place] Resident, PGY-2 01/11/2014, 2:02 PM

## 2014-01-11 NOTE — Telephone Encounter (Signed)
Pt informed. Javier Glazier, Ambrie Carte Please send new RX to pharmacy.

## 2014-01-16 ENCOUNTER — Other Ambulatory Visit: Payer: Self-pay | Admitting: Family Medicine

## 2014-01-16 DIAGNOSIS — E1165 Type 2 diabetes mellitus with hyperglycemia: Secondary | ICD-10-CM

## 2014-01-16 DIAGNOSIS — IMO0002 Reserved for concepts with insufficient information to code with codable children: Secondary | ICD-10-CM

## 2014-01-16 MED ORDER — GLUCOSE BLOOD VI STRP: ORAL_STRIP | Status: DC

## 2014-02-12 ENCOUNTER — Telehealth: Payer: Self-pay | Admitting: Family Medicine

## 2014-02-12 NOTE — Telephone Encounter (Signed)
Received request from [facility] aid for copy of medical records justifying [facility] supplies as they are being audited. OPrinted and faxed back copy of last note on 3/10.   Laroy Apple, MD [place] Resident, PGY-2 02/12/2014, 11:41 AM

## 2014-03-11 ENCOUNTER — Other Ambulatory Visit: Payer: Self-pay | Admitting: *Deleted

## 2014-03-11 DIAGNOSIS — I10 Essential (primary) hypertension: Secondary | ICD-10-CM

## 2014-04-02 ENCOUNTER — Encounter: Payer: Self-pay | Admitting: Family Medicine

## 2014-04-02 ENCOUNTER — Other Ambulatory Visit: Payer: Self-pay | Admitting: Family Medicine

## 2014-04-02 DIAGNOSIS — N529 Male erectile dysfunction, unspecified: Secondary | ICD-10-CM | POA: Insufficient documentation

## 2014-04-02 DIAGNOSIS — C61 Malignant neoplasm of prostate: Secondary | ICD-10-CM

## 2014-04-04 ENCOUNTER — Ambulatory Visit (INDEPENDENT_AMBULATORY_CARE_PROVIDER_SITE_OTHER): Payer: PRIVATE HEALTH INSURANCE | Admitting: Family Medicine

## 2014-04-04 VITALS — BP 144/77 | HR 53 | Temp 97.9°F | Wt 302.0 lb

## 2014-04-04 DIAGNOSIS — K219 Gastro-esophageal reflux disease without esophagitis: Secondary | ICD-10-CM

## 2014-04-04 DIAGNOSIS — R252 Cramp and spasm: Secondary | ICD-10-CM

## 2014-04-04 DIAGNOSIS — I10 Essential (primary) hypertension: Secondary | ICD-10-CM

## 2014-04-04 DIAGNOSIS — IMO0001 Reserved for inherently not codable concepts without codable children: Secondary | ICD-10-CM

## 2014-04-04 DIAGNOSIS — IMO0002 Reserved for concepts with insufficient information to code with codable children: Secondary | ICD-10-CM

## 2014-04-04 DIAGNOSIS — E1165 Type 2 diabetes mellitus with hyperglycemia: Secondary | ICD-10-CM

## 2014-04-04 DIAGNOSIS — L989 Disorder of the skin and subcutaneous tissue, unspecified: Secondary | ICD-10-CM

## 2014-04-04 LAB — POCT GLYCOSYLATED HEMOGLOBIN (HGB A1C): Hemoglobin A1C: 8.2

## 2014-04-04 MED ORDER — TORSEMIDE 20 MG PO TABS: 20.0000 mg | ORAL_TABLET | Freq: Every day | ORAL | Status: DC

## 2014-04-04 MED ORDER — ESOMEPRAZOLE MAGNESIUM 40 MG PO CPDR: 40.0000 mg | DELAYED_RELEASE_CAPSULE | Freq: Every day | ORAL | Status: DC

## 2014-04-04 MED ORDER — GLUCOSE BLOOD VI STRP: ORAL_STRIP | Status: DC

## 2014-04-04 MED ORDER — MOMETASONE FUROATE 50 MCG/ACT NA SUSP: 2.0000 | Freq: Every day | NASAL | Status: DC

## 2014-04-04 MED ORDER — GLUCOSE BLOOD VI STRP
ORAL_STRIP | Status: DC
Start: 2014-04-04 — End: 2016-08-25

## 2014-04-04 NOTE — Assessment & Plan Note (Signed)
L thigh with large skin tag appearing lesion, approx  2 cm on 0.5 cm stalk Irritated per his report Skin tag on R irritated and inflamed L may be dermatofibroma, will plan to remove at pts next follow up, he will schedule specific appt for it.

## 2014-04-04 NOTE — Assessment & Plan Note (Signed)
Controlled Continue lisinopril, metoprolo, torsemide F/u 3 months

## 2014-04-04 NOTE — Assessment & Plan Note (Addendum)
A1C improved to 8.2 Continue lantus at current dose, 90 BID Inc novolog to 10 TID at meals F/u 3 months Also has diabetic neuropathy, needs orthotics vs diabetic shoes, has seen podiatry

## 2014-04-04 NOTE — Progress Notes (Signed)
Patient ID: Adam [name], male   DOB: Mar 17, 1946, 68 y.o.   MRN: [id number]  Kenn File, MD Phone: [phone]  Subjective:  Chief complaint-noted  Pt Here for followup hypertension diabetes  Patient describes her they had a very hard time lately as they've had several sick family members and one of them is in critical care in the hospital  Hypertension Describes that he is using his blood pressure medicines daily Walking a lot in the hospital but no formal exercise regimen No red flags: Chest pain, dyspnea, new edema, palpitations, headaches Average blood pressure 110 to 130 over 60s  Diabetes Taking medication every day, describes 90 units of Lantus twice a day and 10 units of NovoLog before large meals, usually does not use this 3 times a day Needs a strip prescription today Average fasting blood sugar 100-150, average postprandials 170 with range of 150 to 230  ROS-  Per HPI  Past Medical History Patient Active Problem List   Diagnosis Date Noted  . Foot cramps 04/04/2014  . Erectile dysfunction 04/02/2014  . Pain in joint, shoulder region 09/05/2013  . Back pain 05/18/2013  . GERD (gastroesophageal reflux disease) 03/20/2013  . Unspecified vitamin D deficiency 03/07/2013  . Embedded toenail 08/04/2012  . Scrotum, abscess 08/04/2012  . Preop cardiovascular exam 03/03/2012  . Ventral hernia 10/06/2011  . Localized swelling, mass, or lump of lower extremity 05/16/2011  . OBSTRUCTIVE SLEEP APNEA 02/10/2010  . SLEEP APNEA 12/30/2009  . FATIGUE, CHRONIC 12/30/2009  . PERIPHERAL VASCULAR DISEASE WITH CLAUDICATION 03/05/2009  . ABDOMINAL AORTIC ANEURYSM REPAIR, HX OF 03/05/2009  . OBESITY 01/01/2009  . Chronic kidney disease (CKD), stage III (moderate) 01/12/2008  . BACK PAIN, LUMBAR 09/05/2007  . PROSTATE CANCER 12/29/2006  . Diabetes mellitus out of control 12/29/2006  . NEUROPATHY, DIABETIC 12/29/2006  . HYPERLIPIDEMIA 12/29/2006  . Gouty arthropathy  12/29/2006  . IMPOTENCE INORGANIC 12/29/2006  . HYPERTENSION, BENIGN SYSTEMIC 12/29/2006  . MYOCARDIAL INFARCTION, OLD 12/29/2006  . CORONARY, ARTERIOSCLEROSIS 12/29/2006  . CLAUDICATION, INTERMITTENT 12/29/2006  . DIVERTICULITIS OF COLON, NOS 12/29/2006  . ARTHRITIS 12/29/2006    Medications- reviewed and updated Current Outpatient Prescriptions  Medication Sig Dispense Refill  . aspirin (BAYER CHILDRENS ASPIRIN) 81 MG chewable tablet Chew 81 mg by mouth daily.       Marland Kitchen atorvastatin (LIPITOR) 80 MG tablet Take 1 tablet (80 mg total) by mouth daily.  90 tablet  3  . cilostazol (PLETAL) 100 MG tablet Take 1 tablet (100 mg total) by mouth 2 (two) times daily.  60 tablet  10  . lisinopril (PRINIVIL,ZESTRIL) 40 MG tablet Take 1 tablet (40 mg total) by mouth daily.  90 tablet  3  . meloxicam (MOBIC) 7.5 MG tablet Take 1 tablet (7.5 mg total) by mouth daily.  30 tablet  2  . metFORMIN (GLUCOPHAGE) 500 MG tablet Take 1 tablet (500 mg total) by mouth daily with breakfast.  180 tablet  3  . metoprolol (LOPRESSOR) 100 MG tablet Take 1 tablet (100 mg total) by mouth 2 (two) times daily.  60 tablet  11  . torsemide (DEMADEX) 20 MG tablet Take 1 tablet (20 mg total) by mouth daily.  90 tablet  3  . Blood Glucose Monitoring Suppl (ONE TOUCH ULTRA SYSTEM KIT) W/DEVICE KIT 1 kit by Does not apply route once.  1 each  0  . doxycycline (ADOXA) 100 MG tablet Take 100 mg by mouth 2 (two) times daily.      Marland Kitchen esomeprazole ([place])  40 MG capsule Take 1 capsule (40 mg total) by mouth daily.  30 capsule  11  . glucose blood (ONE TOUCH ULTRA TEST) test strip Use four times daily  200 each  12  . insulin aspart (NOVOLOG) 100 UNIT/ML injection Inject 10 Units into the skin 3 (three) times daily with meals. Dispense 1 month supply.  10 mL  11  . insulin glargine (LANTUS) 100 UNIT/ML injection Inject 0.95 mLs (95 Units total) into the skin 2 (two) times daily.  10 mL  5  . Insulin Pen Needle (B-D ULTRAFINE III SHORT  PEN) 31G X 8 MM MISC 1 Syringe by Does not apply route 5 (five) times daily.  200 each  12  . Insulin Syringe-Needle U-100 (INSULIN SYRINGE .5CC/30GX1/2") 30G X 1/2" 0.5 ML MISC 1 Device by Does not apply route as needed.  100 each  12  . mometasone (NASONEX) 50 MCG/ACT nasal spray Place 2 sprays into the nose daily.  17 g  12  . NON FORMULARY Insulin syringe.      . traMADol (ULTRAM) 50 MG tablet Take 2 tablets (100 mg total) by mouth every 8 (eight) hours as needed for pain.  90 tablet  2   No current facility-administered medications for this visit.    Objective: BP 144/77  Pulse 53  Temp(Src) 97.9 F (36.6 C) (Oral)  Wt 302 lb (136.986 kg) Gen: NAD, alert, cooperative with exam HEENT: NCAT CV: RRR, good S1/S2, no murmur Resp: CTABL, no wheezes, non-labored Ext: No edema, warm Neuro: Alert and oriented, No gross deficits   Assessment/Plan:  Diabetes mellitus out of control A1C improved to 8.2 Continue lantus at current dose, 90 BID Inc novolog to 10 TID at meals F/u 3 months   HYPERTENSION, BENIGN SYSTEMIC Controlled Continue lisinopril, metoprolo, torsemide F/u 3 months  Skin lesion L thigh with large skin tag appearing lesion, approx  2 cm on 0.5 cm stalk Irritated per his report Skin tag on R irritated and inflamed L may be dermatofibroma, will plan to remove at pts next follow up, he will schedule specific appt for it.     Orders Placed This Encounter  Procedures  . POCT A1C    Meds ordered this encounter  Medications  . torsemide (DEMADEX) 20 MG tablet    Sig: Take 1 tablet (20 mg total) by mouth daily.    Dispense:  90 tablet    Refill:  3  . esomeprazole (NEXIUM) 40 MG capsule    Sig: Take 1 capsule (40 mg total) by mouth daily.    Dispense:  30 capsule    Refill:  11  . mometasone (NASONEX) 50 MCG/ACT nasal spray    Sig: Place 2 sprays into the nose daily.    Dispense:  17 g    Refill:  12

## 2014-04-04 NOTE — Patient Instructions (Signed)
Great to see you!  Come back in 3 months 

## 2014-04-08 ENCOUNTER — Telehealth: Payer: Self-pay | Admitting: *Deleted

## 2014-04-08 NOTE — Telephone Encounter (Signed)
Received fax refill request from [facility] for Losartan.  Per chart note on 01/04/14--Patient did not meed Medicaid prior auth criteria since he had not tried and failed an ACE before trying an ARB.  Rx was changed to lisinopril 40 mg daily.  Called and informed pharmacist of med change.  Burna Forts, BSN, RN-BC

## 2014-04-08 NOTE — Telephone Encounter (Signed)
Crystal from [employer] called to request NPI number.  Pt has appt for diabetic foot care today.  Pt is seen every three months for foot care.  NPI number given x 3 visits.  Derl Barrow, RN

## 2014-04-11 ENCOUNTER — Ambulatory Visit (INDEPENDENT_AMBULATORY_CARE_PROVIDER_SITE_OTHER): Payer: Medicare Other | Admitting: Podiatrist

## 2014-04-11 ENCOUNTER — Encounter: Payer: Self-pay | Admitting: Podiatrist

## 2014-04-11 VITALS — BP 133/74 | HR 64 | Resp 17 | Ht 77.0 in | Wt 325.0 lb

## 2014-04-11 DIAGNOSIS — M79609 Pain in unspecified limb: Secondary | ICD-10-CM

## 2014-04-11 DIAGNOSIS — B351 Tinea unguium: Secondary | ICD-10-CM

## 2014-04-16 NOTE — Progress Notes (Signed)
HPI: Patient presents today for follow up of diabetic foot and nail care. Past medical history, meds, and allergies reviewed.  Objective: Neurovascular status unchanged with palpable pedal pulses at 2/4 dp and pt bilateral and neurological sensation intact. Patients nails are elongated, thickened, discolored, dystrophic with ingrown deformity present.  Assessment: Diabetes , symptomatic mycotic toenails  Plan: Discussed treatment options and alternatives. Debrided nails without complication. . Return appointment recommended at routine intervals of 3 months.  Trudie Buckler, DPM

## 2014-04-17 ENCOUNTER — Telehealth: Payer: Self-pay | Admitting: Family Medicine

## 2014-04-17 DIAGNOSIS — IMO0002 Reserved for concepts with insufficient information to code with codable children: Secondary | ICD-10-CM

## 2014-04-17 DIAGNOSIS — E1165 Type 2 diabetes mellitus with hyperglycemia: Secondary | ICD-10-CM

## 2014-04-17 MED ORDER — INSULIN ASPART 100 UNIT/ML ~~LOC~~ SOLN: 10.0000 [IU] | Freq: Three times a day (TID) | SUBCUTANEOUS | Status: DC

## 2014-04-17 MED ORDER — "INSULIN SYRINGE 30G X 1/2"" 0.5 ML MISC": 1.0000 | Status: DC | PRN

## 2014-04-17 NOTE — Telephone Encounter (Signed)
Patient states that he was only prescribed 1 vial of insulin instead of the usual 3 vials. Also, he needs syringes sent in. Please advise.

## 2014-04-17 NOTE — Telephone Encounter (Signed)
Insulin Rx corrected, syringes also sent.   Laroy Apple, MD [place] Resident, PGY-2 04/17/2014, 5:37 PM

## 2014-04-18 ENCOUNTER — Telehealth: Payer: Self-pay | Admitting: *Deleted

## 2014-04-18 MED ORDER — INSULIN GLARGINE 100 UNIT/ML ~~LOC~~ SOLN: 95.0000 [IU] | Freq: Two times a day (BID) | SUBCUTANEOUS | Status: DC

## 2014-04-18 NOTE — Telephone Encounter (Signed)
Called and informed patient that his Rx was sent to his pharmacy.[place], Kevin Fenton

## 2014-04-18 NOTE — Telephone Encounter (Signed)
Lantus refilled completed today; sent to pharmacy.  Derl Barrow, RN

## 2014-04-19 ENCOUNTER — Telehealth: Payer: Self-pay | Admitting: *Deleted

## 2014-04-19 NOTE — Telephone Encounter (Signed)
He believes he has some Diabetic foot wear called in.  Doesn't know where it's been called into.  I'm just following up for him.  I want to see where this has been called into or I can give information for a home delivery company.  Please give me a call.

## 2014-04-22 NOTE — Telephone Encounter (Signed)
Attempted to call with no success

## 2014-04-23 NOTE — Telephone Encounter (Signed)
He believes he has a prescription for Diabetic shoes sent to [facility] in [facility].  Call either the patient or myself.  I returned her call and informed her that we use a company called [employer].  We are in the process of getting authorization from Dr. Wendi Snipes stating that the patient is Diabetic and has a medical necessity for shoes.  Once we get the authorization we will schedule the patient an appointment to be measured and allow him to choose the shoes he wants.  I asked her to call if she has any further questions.

## 2014-04-24 ENCOUNTER — Telehealth: Payer: Self-pay | Admitting: Family Medicine

## 2014-04-24 NOTE — Telephone Encounter (Signed)
Called Pt about DM care. When Pt calls back, please schedule appointment to check BP and LDL.

## 2014-07-01 ENCOUNTER — Other Ambulatory Visit: Payer: Self-pay | Admitting: *Deleted

## 2014-07-01 DIAGNOSIS — M109 Gout, unspecified: Secondary | ICD-10-CM

## 2014-07-01 MED ORDER — MELOXICAM 7.5 MG PO TABS
7.5000 mg | ORAL_TABLET | Freq: Every day | ORAL | Status: DC
Start: 2014-07-01 — End: 2014-08-01

## 2014-07-22 ENCOUNTER — Ambulatory Visit: Payer: PRIVATE HEALTH INSURANCE | Admitting: Family Medicine

## 2014-07-24 ENCOUNTER — Ambulatory Visit (INDEPENDENT_AMBULATORY_CARE_PROVIDER_SITE_OTHER): Payer: PRIVATE HEALTH INSURANCE | Admitting: Family Medicine

## 2014-07-24 ENCOUNTER — Encounter: Payer: Self-pay | Admitting: Family Medicine

## 2014-07-24 VITALS — BP 127/70 | HR 67 | Temp 97.8°F | Ht 76.0 in | Wt 334.4 lb

## 2014-07-24 DIAGNOSIS — L989 Disorder of the skin and subcutaneous tissue, unspecified: Secondary | ICD-10-CM

## 2014-07-24 DIAGNOSIS — N498 Inflammatory disorders of other specified male genital organs: Secondary | ICD-10-CM

## 2014-07-24 DIAGNOSIS — IMO0002 Reserved for concepts with insufficient information to code with codable children: Secondary | ICD-10-CM

## 2014-07-24 DIAGNOSIS — E669 Obesity, unspecified: Secondary | ICD-10-CM

## 2014-07-24 DIAGNOSIS — I10 Essential (primary) hypertension: Secondary | ICD-10-CM

## 2014-07-24 DIAGNOSIS — IMO0001 Reserved for inherently not codable concepts without codable children: Secondary | ICD-10-CM

## 2014-07-24 DIAGNOSIS — N492 Inflammatory disorders of scrotum: Secondary | ICD-10-CM

## 2014-07-24 DIAGNOSIS — E1149 Type 2 diabetes mellitus with other diabetic neurological complication: Secondary | ICD-10-CM

## 2014-07-24 DIAGNOSIS — E1165 Type 2 diabetes mellitus with hyperglycemia: Secondary | ICD-10-CM

## 2014-07-24 DIAGNOSIS — Z23 Encounter for immunization: Secondary | ICD-10-CM

## 2014-07-24 LAB — POCT GLYCOSYLATED HEMOGLOBIN (HGB A1C): Hemoglobin A1C: 8.7

## 2014-07-24 NOTE — Assessment & Plan Note (Signed)
Controlled, no red flags Continue metoprolol, torsemide, lisinopril, 3 months, has cardiology followup soon

## 2014-07-24 NOTE — Progress Notes (Signed)
Patient ID: Adam [name], male   DOB: [birth year], 68 y.o.   MRN: [id number]   HPI  Patient presents today for HTN DM2 f/u  R knee pain intensly for 2 hours last week, now resolved S/p BL knee replacement in 2000 at christmas  Hypertension Complaint with meds - Yes Checking BP at home ranging  100-110/60-70 Exercising Regularly - Yes 5 min walking 2-3 times/day Watching Salt intake - No Pertinent ROS:  Headache - NO Chest pain - No Dyspnea - No Palpitations - No LE edema - No  Diabetes mellitus 2 Compliant with meds - Yes Checking CBGs? Yes  Fasting avg - 110 - 150  Postprandial average - 200-250 Exercising regularly? - Yes Watching carbohydrate intake? - Yes Neuropathy ? - Yes Hypoglycemic events - Yes 1 time per 6 months  - Recovers with : glucerna  Pertinent ROS:  Polyuria - No Polydipsia - No Vision problems - No   Smoking status noted ROS: Per HPI  Objective: BP 127/70  Pulse 67  Temp(Src) 97.8 F (36.6 C) (Oral)  Ht 6\' 4"  (1.93 m)  Wt 334 lb 6.4 oz (151.683 kg)  BMI 40.72 kg/m2 Gen: NAD, alert, cooperative with exam HEENT: NCAT CV: RRR, good S1/S2, no murmur Resp: CTABL, no wheezes, non-labored Abd: SNTND, BS present Neuro: Alert and oriented, No gross deficits  Diabetic foot exam: One plus DP pulse bilaterally, sensation intact to monofilament, no lesions  Assessment and plan:  Scrotum, abscess Managed by urology Still bothering him re- currently and intermittently Followup with urology as needed   Skin lesion Not bothering him currently Treating with tea tree oil, continue as I can see a Harm in this Would recommend dermatology clinic (in our clinic) for removal if it becomes irritated  OBESITY Not exercising, discussed diet encouraged exercise  NEUROPATHY, DIABETIC Exam surprisingly normal Continue gabapentin, focus on treating diabetes as well as possible  HYPERTENSION, BENIGN SYSTEMIC Controlled, no red flags Continue  metoprolol, torsemide, lisinopril, 3 months, has cardiology followup soon  Diabetes mellitus out of control Uncontrolled, A1c declined 8.7 Continue Lantus 90 units twice a day, and NovoLog 10 units with meals 8 urine protein creatinine ratio today following up with renal stent Followup 3 months     Orders Placed This Encounter  Procedures  . Protein / Creatinine Ratio, Urine  . POCT A1C

## 2014-07-24 NOTE — Assessment & Plan Note (Signed)
Managed by urology Still bothering him re- currently and intermittently Followup with urology as needed

## 2014-07-24 NOTE — Assessment & Plan Note (Signed)
Not exercising, discussed diet encouraged exercise

## 2014-07-24 NOTE — Assessment & Plan Note (Signed)
Uncontrolled, A1c declined 8.7 Continue Lantus 90 units twice a day, and NovoLog 10 units with meals 8 urine protein creatinine ratio today following up with renal stent Followup 3 months

## 2014-07-24 NOTE — Assessment & Plan Note (Signed)
Exam surprisingly normal Continue gabapentin, focus on treating diabetes as well as possible

## 2014-07-24 NOTE — Patient Instructions (Signed)
Come back in 3 months  Wathc your diet! Diet Recommendations for Diabetes   Starchy (carb) foods include: Bread, rice, pasta, potatoes, corn, crackers, bagels, muffins, all baked goods.   Protein foods include: Meat, fish, poultry, eggs, dairy foods, and beans such as pinto and kidney beans (beans also provide carbohydrate).   1. Eat at least 3 meals and 1-2 snacks per day. Never go more than 4-5 hours while awake without eating.  2. Limit starchy foods to TWO per meal and ONE per snack. ONE portion of a starchy  food is equal to the following:   - ONE slice of bread (or its equivalent, such as half of a hamburger bun).   - 1/2 cup of a "scoopable" starchy food such as potatoes or rice.   - 1 OUNCE (28 grams) of starchy snack foods such as crackers or pretzels (look on label).   - 15 grams of carbohydrate as shown on food label.  3. Both lunch and dinner should include a protein food, a carb food, and vegetables.   - Obtain twice as many veg's as protein or carbohydrate foods for both lunch and dinner.   - Try to keep frozen veg's on hand for a quick vegetable serving.     - Fresh or frozen veg's are best.  4. Breakfast should always include protein.

## 2014-07-24 NOTE — Assessment & Plan Note (Signed)
Not bothering him currently Treating with tea tree oil, continue as I can see a Harm in this Would recommend dermatology clinic (in our clinic) for removal if it becomes irritated

## 2014-07-25 ENCOUNTER — Encounter: Payer: Self-pay | Admitting: Family Medicine

## 2014-07-25 LAB — PROTEIN / CREATININE RATIO, URINE
Creatinine, Urine: 101.4 mg/dL
Protein Creatinine Ratio: 0.05 (ref <0.15)
Total Protein, Urine: 5 mg/dL (ref 5–25)

## 2014-08-01 ENCOUNTER — Other Ambulatory Visit: Payer: Self-pay | Admitting: *Deleted

## 2014-08-01 DIAGNOSIS — M109 Gout, unspecified: Secondary | ICD-10-CM

## 2014-08-02 MED ORDER — MELOXICAM 7.5 MG PO TABS: 7.5000 mg | ORAL_TABLET | Freq: Every day | ORAL | Status: DC

## 2014-08-08 ENCOUNTER — Encounter (HOSPITAL_COMMUNITY): Payer: Self-pay | Admitting: Emergency Medicine

## 2014-08-08 ENCOUNTER — Ambulatory Visit: Payer: Medicare Other | Admitting: Podiatrist

## 2014-08-08 ENCOUNTER — Emergency Department (HOSPITAL_COMMUNITY)
Admission: EM | Admit: 2014-08-08 | Discharge: 2014-08-08 | Disposition: A | Discharge status: Home / Self Care | Payer: PRIVATE HEALTH INSURANCE | Attending: Emergency Medicine | Admitting: Emergency Medicine

## 2014-08-08 DIAGNOSIS — Z87891 Personal history of nicotine dependence: Secondary | ICD-10-CM | POA: Insufficient documentation

## 2014-08-08 DIAGNOSIS — N183 Chronic kidney disease, stage 3 (moderate): Secondary | ICD-10-CM | POA: Insufficient documentation

## 2014-08-08 DIAGNOSIS — M545 Low back pain, unspecified: Secondary | ICD-10-CM

## 2014-08-08 DIAGNOSIS — Z7951 Long term (current) use of inhaled steroids: Secondary | ICD-10-CM | POA: Insufficient documentation

## 2014-08-08 DIAGNOSIS — G4733 Obstructive sleep apnea (adult) (pediatric): Secondary | ICD-10-CM | POA: Insufficient documentation

## 2014-08-08 DIAGNOSIS — I252 Old myocardial infarction: Secondary | ICD-10-CM | POA: Diagnosis not present

## 2014-08-08 DIAGNOSIS — M109 Gout, unspecified: Secondary | ICD-10-CM

## 2014-08-08 DIAGNOSIS — I129 Hypertensive chronic kidney disease with stage 1 through stage 4 chronic kidney disease, or unspecified chronic kidney disease: Secondary | ICD-10-CM | POA: Insufficient documentation

## 2014-08-08 DIAGNOSIS — E785 Hyperlipidemia, unspecified: Secondary | ICD-10-CM | POA: Insufficient documentation

## 2014-08-08 DIAGNOSIS — Z791 Long term (current) use of non-steroidal anti-inflammatories (NSAID): Secondary | ICD-10-CM | POA: Diagnosis not present

## 2014-08-08 DIAGNOSIS — E669 Obesity, unspecified: Secondary | ICD-10-CM | POA: Diagnosis not present

## 2014-08-08 DIAGNOSIS — I2510 Atherosclerotic heart disease of native coronary artery without angina pectoris: Secondary | ICD-10-CM | POA: Insufficient documentation

## 2014-08-08 DIAGNOSIS — Z8546 Personal history of malignant neoplasm of prostate: Secondary | ICD-10-CM | POA: Diagnosis not present

## 2014-08-08 DIAGNOSIS — Z7982 Long term (current) use of aspirin: Secondary | ICD-10-CM | POA: Diagnosis not present

## 2014-08-08 DIAGNOSIS — I7389 Other specified peripheral vascular diseases: Secondary | ICD-10-CM | POA: Insufficient documentation

## 2014-08-08 DIAGNOSIS — Z79899 Other long term (current) drug therapy: Secondary | ICD-10-CM | POA: Diagnosis not present

## 2014-08-08 DIAGNOSIS — Z8659 Personal history of other mental and behavioral disorders: Secondary | ICD-10-CM | POA: Insufficient documentation

## 2014-08-08 DIAGNOSIS — Z9889 Other specified postprocedural states: Secondary | ICD-10-CM | POA: Insufficient documentation

## 2014-08-08 DIAGNOSIS — E1149 Type 2 diabetes mellitus with other diabetic neurological complication: Secondary | ICD-10-CM | POA: Insufficient documentation

## 2014-08-08 DIAGNOSIS — Z792 Long term (current) use of antibiotics: Secondary | ICD-10-CM | POA: Diagnosis not present

## 2014-08-08 DIAGNOSIS — M129 Arthropathy, unspecified: Secondary | ICD-10-CM | POA: Diagnosis not present

## 2014-08-08 DIAGNOSIS — Z794 Long term (current) use of insulin: Secondary | ICD-10-CM | POA: Diagnosis not present

## 2014-08-08 DIAGNOSIS — K219 Gastro-esophageal reflux disease without esophagitis: Secondary | ICD-10-CM | POA: Insufficient documentation

## 2014-08-08 MED ORDER — OXYCODONE-ACETAMINOPHEN 5-325 MG PO TABS
2.0000 | ORAL_TABLET | Freq: Once | ORAL | Status: AC
  Administered 2014-08-08: 2 via ORAL
  Filled 2014-08-08: qty 2

## 2014-08-08 MED ORDER — OXYCODONE-ACETAMINOPHEN 5-325 MG PO TABS: 1.0000 | ORAL_TABLET | Freq: Four times a day (QID) | ORAL | Status: DC | PRN

## 2014-08-08 MED ORDER — METHOCARBAMOL 500 MG PO TABS
500.0000 mg | ORAL_TABLET | Freq: Once | ORAL | Status: AC
  Administered 2014-08-08: 500 mg via ORAL
  Filled 2014-08-08: qty 1

## 2014-08-08 MED ORDER — METHOCARBAMOL 500 MG PO TABS
500.0000 mg | ORAL_TABLET | Freq: Two times a day (BID) | ORAL | Status: DC
Start: 2014-08-08 — End: 2014-11-26

## 2014-08-08 NOTE — ED Notes (Signed)
Pt stated that he is having l/lower back pain x 2 weeks. Stated that he was reaching to plug in coffee pot and "wrenched " his back. Pain has limited his movement over last two days. Pt stated that he called PCP today, can not be seen until tomorrow
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
## 2014-08-08 NOTE — Discharge Instructions (Signed)

## 2014-08-09 ENCOUNTER — Ambulatory Visit: Payer: PRIVATE HEALTH INSURANCE | Admitting: Family Medicine

## 2014-08-09 NOTE — ED Provider Notes (Signed)
Medical screening examination/treatment/procedure(s) were performed by non-physician practitioner and as supervising physician I was immediately available for consultation/collaboration.   EKG Interpretation None        Blanchie Dessert, MD 08/09/14 1255

## 2014-08-29 ENCOUNTER — Ambulatory Visit: Payer: Medicare Other | Admitting: Podiatrist

## 2014-09-02 ENCOUNTER — Other Ambulatory Visit: Payer: Self-pay | Admitting: *Deleted

## 2014-09-02 DIAGNOSIS — M109 Gout, unspecified: Secondary | ICD-10-CM

## 2014-09-02 MED ORDER — MELOXICAM 7.5 MG PO TABS: 7.5000 mg | ORAL_TABLET | Freq: Every day | ORAL | Status: DC

## 2014-09-30 ENCOUNTER — Other Ambulatory Visit: Payer: Self-pay | Admitting: *Deleted

## 2014-09-30 DIAGNOSIS — M109 Gout, unspecified: Secondary | ICD-10-CM

## 2014-10-01 MED ORDER — MELOXICAM 7.5 MG PO TABS: 7.5000 mg | ORAL_TABLET | Freq: Every day | ORAL | Status: DC

## 2014-10-09 ENCOUNTER — Encounter: Payer: Self-pay | Admitting: Family Medicine

## 2014-10-09 ENCOUNTER — Ambulatory Visit (INDEPENDENT_AMBULATORY_CARE_PROVIDER_SITE_OTHER): Payer: PRIVATE HEALTH INSURANCE | Admitting: Family Medicine

## 2014-10-09 VITALS — BP 173/77 | HR 61 | Temp 98.6°F | Ht 77.0 in | Wt 332.0 lb

## 2014-10-09 DIAGNOSIS — Z23 Encounter for immunization: Secondary | ICD-10-CM

## 2014-10-09 DIAGNOSIS — Z0000 Encounter for general adult medical examination without abnormal findings: Secondary | ICD-10-CM

## 2014-10-09 DIAGNOSIS — IMO0002 Reserved for concepts with insufficient information to code with codable children: Secondary | ICD-10-CM

## 2014-10-09 DIAGNOSIS — M109 Gout, unspecified: Secondary | ICD-10-CM

## 2014-10-09 DIAGNOSIS — M549 Dorsalgia, unspecified: Secondary | ICD-10-CM

## 2014-10-09 DIAGNOSIS — M1009 Idiopathic gout, multiple sites: Secondary | ICD-10-CM

## 2014-10-09 DIAGNOSIS — E1165 Type 2 diabetes mellitus with hyperglycemia: Secondary | ICD-10-CM

## 2014-10-09 DIAGNOSIS — I10 Essential (primary) hypertension: Secondary | ICD-10-CM

## 2014-10-09 MED ORDER — TRAMADOL HCL 50 MG PO TABS: 100.0000 mg | ORAL_TABLET | Freq: Three times a day (TID) | ORAL | Status: DC | PRN

## 2014-10-09 MED ORDER — INSULIN LISPRO 100 UNIT/ML ~~LOC~~ SOLN: 10.0000 [IU] | Freq: Three times a day (TID) | SUBCUTANEOUS | Status: DC

## 2014-10-09 NOTE — Assessment & Plan Note (Signed)
No A1c today as it's been less than 3 months since his last visit  fastings reasonable, postprandials elevated 200-250 Recommended carbohydrate control to avoid weight gain Continue Lantus 95 units twice daily, NovoLog 10-20 units per meal Changing from NovoLog to Humalog per formulary change [name] follow-up 2 months

## 2014-10-09 NOTE — Assessment & Plan Note (Signed)
Elevated continue metoprolol, torsemide, lisinopril Elevated here but controlled at home 10 units total, torsemide, lisinopril Has cardiology follow-up in February also has follow-up with his nephrologist coming

## 2014-10-09 NOTE — Patient Instructions (Signed)
Great to see you!  Stop the mobic until you need it, Gout would increase if you drank beer and ate red meat  I will send a humalog prescription to change from novolog.   Come back in 2-3 months  Try to watch your carbohydrate intake  Diet Recommendations for Diabetes   Starchy (carb) foods include: Bread, rice, pasta, potatoes, corn, crackers, bagels, muffins, all baked goods.   Protein foods include: Meat, fish, poultry, eggs, dairy foods, and beans such as pinto and kidney beans (beans also provide carbohydrate).   1. Eat at least 3 meals and 1-2 snacks per day. Never go more than 4-5 hours while awake without eating.  2. Limit starchy foods to TWO per meal and ONE per snack. ONE portion of a starchy  food is equal to the following:   - ONE slice of bread (or its equivalent, such as half of a hamburger bun).   - 1/2 cup of a "scoopable" starchy food such as potatoes or rice.   - 1 OUNCE (28 grams) of starchy snack foods such as crackers or pretzels (look on label).   - 15 grams of carbohydrate as shown on food label.  3. Both lunch and dinner should include a protein food, a carb food, and vegetables.   - Obtain twice as many veg's as protein or carbohydrate foods for both lunch and dinner.   - Try to keep frozen veg's on hand for a quick vegetable serving.     - Fresh or frozen veg's are best.  4. Breakfast should always include protein.

## 2014-10-09 NOTE — Assessment & Plan Note (Signed)
Stable, not bothering him for quite some time Considering chronic kidney disease discontinue Mobic Used tramadol instead Would consider starting Uloric or other prophylactic medicine if he starts getting flares

## 2014-10-09 NOTE — Progress Notes (Signed)
Patient ID: Adam [name], male   DOB: 09-06-1946, 68 y.o.   MRN: [id number]   HPI  Patient presents today for follow-up  Hypertension Taking meds everyday, reconciled in detail No Headache, chest pain, dyspnea, palpitations, or leg edema Not really watching salt intake or exercising  Diabetes Taking 10-20 units of NovoLog 3 times a day with meals, taking 95 units of Lantus twice daily Fasting blood sugars 110-140, postprandial blood sugars 200-250 Needs to change to Humalog given a recent formulary change Takes his average blood sugar according to his meter is 163  Gout Has not had problems with gout several years, he states he got much better after he quit drinking beer and Back on red meat. Taking meloxicam every day for this States he hasn't had a flare in several years.  Smoking status noted ROS: Per HPI  Objective: BP 173/77 mmHg  Pulse 61  Temp(Src) 98.6 F (37 C) (Oral)  Ht 6\' 5"  (1.956 m)  Wt 332 lb (150.594 kg)  BMI 39.36 kg/m2 Gen: NAD, alert, cooperative with exam HEENT: NCAT CV: RRR, good S1/S2, no murmur Resp: CTABL, no wheezes, non-labored Ext: No edema, warm Neuro: Alert and oriented, No gross deficits  Diabetic foot exam: 2+ DP pulses, no lesions, sensation intact to monofilament throughout   Assessment and plan:  HYPERTENSION, BENIGN SYSTEMIC Elevated continue metoprolol, torsemide, lisinopril Elevated here but controlled at home 10 units total, torsemide, lisinopril Has cardiology follow-up in February also has follow-up with his nephrologist coming   Gouty arthropathy Stable, not bothering him for quite some time Considering chronic kidney disease discontinue Mobic Used tramadol instead Would consider starting Uloric or other prophylactic medicine if he starts getting flares  Diabetes mellitus out of control No A1c today as it's been less than 3 months since his last visit  fastings reasonable, postprandials elevated  200-250 Recommended carbohydrate control to avoid weight gain Continue Lantus 95 units twice daily, NovoLog 10-20 units per meal Changing from NovoLog to Humalog per formulary change Adam [name] follow-up 2 months  Healthcare maintenance Flu shot and tetanus shot today    Orders Placed This Encounter  Procedures  . Pneumococcal conjugate vaccine 13-valent  . Tdap vaccine greater than or equal to 7yo IM    Meds ordered this encounter  Medications  . traMADol (ULTRAM) 50 MG tablet    Sig: Take 2 tablets (100 mg total) by mouth every 8 (eight) hours as needed.    Dispense:  90 tablet    Refill:  2

## 2014-10-09 NOTE — Assessment & Plan Note (Signed)
Flu shot and tetanus shot today

## 2014-10-21 ENCOUNTER — Other Ambulatory Visit: Payer: Self-pay | Admitting: *Deleted

## 2014-10-21 DIAGNOSIS — IMO0002 Reserved for concepts with insufficient information to code with codable children: Secondary | ICD-10-CM

## 2014-10-21 DIAGNOSIS — E1165 Type 2 diabetes mellitus with hyperglycemia: Secondary | ICD-10-CM

## 2014-10-21 MED ORDER — INSULIN GLARGINE 100 UNIT/ML ~~LOC~~ SOLN: 95.0000 [IU] | Freq: Two times a day (BID) | SUBCUTANEOUS | Status: DC

## 2014-11-05 ENCOUNTER — Other Ambulatory Visit: Payer: Self-pay | Admitting: Dermatology

## 2014-11-05 DIAGNOSIS — L918 Other hypertrophic disorders of the skin: Secondary | ICD-10-CM | POA: Diagnosis not present

## 2014-11-05 DIAGNOSIS — D179 Benign lipomatous neoplasm, unspecified: Secondary | ICD-10-CM | POA: Diagnosis not present

## 2014-11-05 DIAGNOSIS — D1723 Benign lipomatous neoplasm of skin and subcutaneous tissue of right leg: Secondary | ICD-10-CM | POA: Diagnosis not present

## 2014-11-05 DIAGNOSIS — L732 Hidradenitis suppurativa: Secondary | ICD-10-CM | POA: Diagnosis not present

## 2014-11-26 ENCOUNTER — Ambulatory Visit (INDEPENDENT_AMBULATORY_CARE_PROVIDER_SITE_OTHER): Payer: Medicare Other | Admitting: Cardiovascular Disease

## 2014-11-26 ENCOUNTER — Encounter: Payer: Self-pay | Admitting: Cardiovascular Disease

## 2014-11-26 VITALS — BP 122/74 | HR 62 | Ht 76.0 in | Wt 331.6 lb

## 2014-11-26 DIAGNOSIS — I10 Essential (primary) hypertension: Secondary | ICD-10-CM

## 2014-11-26 DIAGNOSIS — I2510 Atherosclerotic heart disease of native coronary artery without angina pectoris: Secondary | ICD-10-CM | POA: Diagnosis not present

## 2014-11-26 DIAGNOSIS — I739 Peripheral vascular disease, unspecified: Secondary | ICD-10-CM | POA: Diagnosis not present

## 2014-11-26 DIAGNOSIS — R001 Bradycardia, unspecified: Secondary | ICD-10-CM

## 2014-11-26 DIAGNOSIS — E785 Hyperlipidemia, unspecified: Secondary | ICD-10-CM

## 2014-11-26 LAB — HEPATIC FUNCTION PANEL
ALK PHOS: 75 U/L (ref 39–117)
ALT: 23 U/L (ref 0–53)
AST: 21 U/L (ref 0–37)
Albumin: 3.7 g/dL (ref 3.5–5.2)
BILIRUBIN DIRECT: 0.1 mg/dL (ref 0.0–0.3)
TOTAL PROTEIN: 7.2 g/dL (ref 6.0–8.3)
Total Bilirubin: 0.6 mg/dL (ref 0.2–1.2)

## 2014-11-26 LAB — LIPID PANEL
CHOL/HDL RATIO: 4
Cholesterol: 128 mg/dL (ref 0–200)
HDL: 28.6 mg/dL — ABNORMAL LOW (ref >39.00)
LDL Cholesterol: 66 mg/dL (ref 0–99)
NonHDL: 99.4
TRIGLYCERIDES: 167 mg/dL — AB (ref 0.0–149.0)
VLDL: 33.4 mg/dL (ref 0.0–40.0)

## 2014-11-26 MED ORDER — CILOSTAZOL 100 MG PO TABS: 100.0000 mg | ORAL_TABLET | Freq: Two times a day (BID) | ORAL | Status: DC

## 2014-11-26 NOTE — Assessment & Plan Note (Signed)
Blood pressure is well controlled on current medications. 

## 2014-11-26 NOTE — Patient Instructions (Signed)
Your physician recommends that you continue on your current medications as directed. Please refer to the Current Medication list given to you today.  Lab work today: Lipid panel, Liver profile  Your physician wants you to follow-up in: 1 year with Dr. Fletcher Anon. You will receive a reminder letter in the mail two months in advance. If you don't receive a letter, please call our office to schedule the follow-up appointment.
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
## 2014-11-26 NOTE — Assessment & Plan Note (Signed)
He reports no symptoms suggestive of angina. Continue medical therapy. 

## 2014-11-26 NOTE — Assessment & Plan Note (Signed)
Continue treatment with Crestor. I requested fasting lipid and liver profile today.

## 2014-11-26 NOTE — Assessment & Plan Note (Signed)
The patient has stable moderate bilateral calf claudication with known SFA disease. Symptoms have not changed over the last year and thus I recommend continued medical therapy. I had a prolonged discussion with him about the importance of physical activities. He reports inability to do much walking due to bilateral knee replacement. I suggested water aerobics.

## 2014-12-02 ENCOUNTER — Encounter: Payer: Self-pay | Admitting: Cardiovascular Disease

## 2014-12-02 NOTE — Telephone Encounter (Signed)
This encounter was created in error - please disregard.

## 2014-12-02 NOTE — Telephone Encounter (Signed)
New Msg        Pt states he is returning a call about lab results.    Please call back.

## 2015-01-03 DIAGNOSIS — G4733 Obstructive sleep apnea (adult) (pediatric): Secondary | ICD-10-CM | POA: Diagnosis not present

## 2015-01-06 ENCOUNTER — Encounter (HOSPITAL_COMMUNITY): Payer: Self-pay | Admitting: Emergency Medicine

## 2015-01-06 ENCOUNTER — Other Ambulatory Visit (HOSPITAL_COMMUNITY): Payer: Self-pay

## 2015-01-06 ENCOUNTER — Inpatient Hospital Stay (HOSPITAL_COMMUNITY)
Admission: EM | Admit: 2015-01-06 | Discharge: 2015-01-08 | DRG: 872 | Disposition: A | Discharge status: Home / Self Care | Payer: Medicare Other | Attending: Family Medicine | Admitting: Family Medicine

## 2015-01-06 ENCOUNTER — Emergency Department (HOSPITAL_COMMUNITY): Payer: Medicare Other

## 2015-01-06 DIAGNOSIS — R0602 Shortness of breath: Secondary | ICD-10-CM | POA: Insufficient documentation

## 2015-01-06 DIAGNOSIS — Z87891 Personal history of nicotine dependence: Secondary | ICD-10-CM

## 2015-01-06 DIAGNOSIS — N183 Chronic kidney disease, stage 3 (moderate): Secondary | ICD-10-CM | POA: Diagnosis present

## 2015-01-06 DIAGNOSIS — Z923 Personal history of irradiation: Secondary | ICD-10-CM | POA: Diagnosis not present

## 2015-01-06 DIAGNOSIS — I129 Hypertensive chronic kidney disease with stage 1 through stage 4 chronic kidney disease, or unspecified chronic kidney disease: Secondary | ICD-10-CM | POA: Diagnosis not present

## 2015-01-06 DIAGNOSIS — I739 Peripheral vascular disease, unspecified: Secondary | ICD-10-CM | POA: Diagnosis present

## 2015-01-06 DIAGNOSIS — K219 Gastro-esophageal reflux disease without esophagitis: Secondary | ICD-10-CM | POA: Diagnosis present

## 2015-01-06 DIAGNOSIS — F329 Major depressive disorder, single episode, unspecified: Secondary | ICD-10-CM | POA: Diagnosis not present

## 2015-01-06 DIAGNOSIS — A419 Sepsis, unspecified organism: Principal | ICD-10-CM | POA: Diagnosis present

## 2015-01-06 DIAGNOSIS — I2510 Atherosclerotic heart disease of native coronary artery without angina pectoris: Secondary | ICD-10-CM | POA: Diagnosis not present

## 2015-01-06 DIAGNOSIS — N179 Acute kidney failure, unspecified: Secondary | ICD-10-CM | POA: Diagnosis present

## 2015-01-06 DIAGNOSIS — M109 Gout, unspecified: Secondary | ICD-10-CM | POA: Diagnosis present

## 2015-01-06 DIAGNOSIS — T887XXD Unspecified adverse effect of drug or medicament, subsequent encounter: Secondary | ICD-10-CM | POA: Diagnosis not present

## 2015-01-06 DIAGNOSIS — Z79899 Other long term (current) drug therapy: Secondary | ICD-10-CM | POA: Diagnosis not present

## 2015-01-06 DIAGNOSIS — Z8546 Personal history of malignant neoplasm of prostate: Secondary | ICD-10-CM

## 2015-01-06 DIAGNOSIS — I252 Old myocardial infarction: Secondary | ICD-10-CM

## 2015-01-06 DIAGNOSIS — G4733 Obstructive sleep apnea (adult) (pediatric): Secondary | ICD-10-CM | POA: Diagnosis not present

## 2015-01-06 DIAGNOSIS — Z96653 Presence of artificial knee joint, bilateral: Secondary | ICD-10-CM | POA: Diagnosis not present

## 2015-01-06 DIAGNOSIS — Z794 Long term (current) use of insulin: Secondary | ICD-10-CM | POA: Diagnosis not present

## 2015-01-06 DIAGNOSIS — E785 Hyperlipidemia, unspecified: Secondary | ICD-10-CM | POA: Diagnosis present

## 2015-01-06 DIAGNOSIS — Z7982 Long term (current) use of aspirin: Secondary | ICD-10-CM | POA: Diagnosis not present

## 2015-01-06 DIAGNOSIS — IMO0001 Reserved for inherently not codable concepts without codable children: Secondary | ICD-10-CM | POA: Insufficient documentation

## 2015-01-06 DIAGNOSIS — L02225 Furuncle of perineum: Secondary | ICD-10-CM | POA: Diagnosis present

## 2015-01-06 DIAGNOSIS — R509 Fever, unspecified: Secondary | ICD-10-CM | POA: Diagnosis present

## 2015-01-06 DIAGNOSIS — E1140 Type 2 diabetes mellitus with diabetic neuropathy, unspecified: Secondary | ICD-10-CM | POA: Diagnosis present

## 2015-01-06 DIAGNOSIS — T50905A Adverse effect of unspecified drugs, medicaments and biological substances, initial encounter: Secondary | ICD-10-CM | POA: Insufficient documentation

## 2015-01-06 DIAGNOSIS — M549 Dorsalgia, unspecified: Secondary | ICD-10-CM | POA: Diagnosis not present

## 2015-01-06 DIAGNOSIS — E1165 Type 2 diabetes mellitus with hyperglycemia: Secondary | ICD-10-CM | POA: Diagnosis not present

## 2015-01-06 DIAGNOSIS — M1990 Unspecified osteoarthritis, unspecified site: Secondary | ICD-10-CM | POA: Diagnosis not present

## 2015-01-06 LAB — CBC WITH DIFFERENTIAL/PLATELET
Basophils Absolute: 0 10*3/uL (ref 0.0–0.1)
Basophils Relative: 0 % (ref 0–1)
EOS ABS: 0 10*3/uL (ref 0.0–0.7)
EOS PCT: 0 % (ref 0–5)
HCT: 45.4 % (ref 39.0–52.0)
HEMOGLOBIN: 15.2 g/dL (ref 13.0–17.0)
Lymphocytes Relative: 4 % — ABNORMAL LOW (ref 12–46)
Lymphs Abs: 0.4 10*3/uL — ABNORMAL LOW (ref 0.7–4.0)
MCH: 29 pg (ref 26.0–34.0)
MCHC: 33.5 g/dL (ref 30.0–36.0)
MCV: 86.6 fL (ref 78.0–100.0)
Monocytes Absolute: 0.2 10*3/uL (ref 0.1–1.0)
Monocytes Relative: 2 % — ABNORMAL LOW (ref 3–12)
Neutro Abs: 10.2 10*3/uL — ABNORMAL HIGH (ref 1.7–7.7)
Neutrophils Relative %: 94 % — ABNORMAL HIGH (ref 43–77)
PLATELETS: 188 10*3/uL (ref 150–400)
RBC: 5.24 MIL/uL (ref 4.22–5.81)
RDW: 14.6 % (ref 11.5–15.5)
WBC: 10.9 10*3/uL — AB (ref 4.0–10.5)

## 2015-01-06 LAB — GLUCOSE, CAPILLARY: Glucose-Capillary: 149 mg/dL — ABNORMAL HIGH (ref 70–99)

## 2015-01-06 LAB — I-STAT CG4 LACTIC ACID, ED
LACTIC ACID, VENOUS: 3.73 mmol/L — AB (ref 0.5–2.0)
Lactic Acid, Venous: 1.91 mmol/L (ref 0.5–2.0)

## 2015-01-06 LAB — URINALYSIS, ROUTINE W REFLEX MICROSCOPIC
BILIRUBIN URINE: NEGATIVE
GLUCOSE, UA: NEGATIVE mg/dL
Hgb urine dipstick: NEGATIVE
KETONES UR: NEGATIVE mg/dL
LEUKOCYTES UA: NEGATIVE
Nitrite: NEGATIVE
PH: 5 (ref 5.0–8.0)
PROTEIN: NEGATIVE mg/dL
Specific Gravity, Urine: 1.011 (ref 1.005–1.030)
Urobilinogen, UA: 0.2 mg/dL (ref 0.0–1.0)

## 2015-01-06 LAB — COMPREHENSIVE METABOLIC PANEL WITH GFR
ALT: 25 U/L (ref 0–53)
AST: 28 U/L (ref 0–37)
Albumin: 3.8 g/dL (ref 3.5–5.2)
Alkaline Phosphatase: 87 U/L (ref 39–117)
Anion gap: 7 (ref 5–15)
BUN: 16 mg/dL (ref 6–23)
CO2: 26 mmol/L (ref 19–32)
Calcium: 9 mg/dL (ref 8.4–10.5)
Chloride: 101 mmol/L (ref 96–112)
Creatinine, Ser: 1.45 mg/dL — ABNORMAL HIGH (ref 0.50–1.35)
GFR calc Af Amer: 56 mL/min — ABNORMAL LOW
GFR calc non Af Amer: 48 mL/min — ABNORMAL LOW
Glucose, Bld: 188 mg/dL — ABNORMAL HIGH (ref 70–99)
Potassium: 4.1 mmol/L (ref 3.5–5.1)
Sodium: 134 mmol/L — ABNORMAL LOW (ref 135–145)
Total Bilirubin: 1 mg/dL (ref 0.3–1.2)
Total Protein: 7.7 g/dL (ref 6.0–8.3)

## 2015-01-06 LAB — CBG MONITORING, ED: Glucose-Capillary: 119 mg/dL — ABNORMAL HIGH (ref 70–99)

## 2015-01-06 MED ORDER — VANCOMYCIN HCL 10 G IV SOLR
2500.0000 mg | Freq: Once | INTRAVENOUS | Status: AC
  Administered 2015-01-06: 2500 mg via INTRAVENOUS
  Filled 2015-01-06: qty 2500

## 2015-01-06 MED ORDER — METOPROLOL TARTRATE 100 MG PO TABS
100.0000 mg | ORAL_TABLET | Freq: Two times a day (BID) | ORAL | Status: DC
  Administered 2015-01-06 – 2015-01-08 (×4): 100 mg via ORAL
  Filled 2015-01-06 (×5): qty 1

## 2015-01-06 MED ORDER — MORPHINE SULFATE 4 MG/ML IJ SOLN
4.0000 mg | Freq: Once | INTRAMUSCULAR | Status: AC
  Administered 2015-01-06: 4 mg via INTRAVENOUS
  Filled 2015-01-06: qty 1

## 2015-01-06 MED ORDER — HEPARIN SODIUM (PORCINE) 5000 UNIT/ML IJ SOLN
5000.0000 [IU] | Freq: Three times a day (TID) | INTRAMUSCULAR | Status: DC
  Administered 2015-01-06 – 2015-01-08 (×4): 5000 [IU] via SUBCUTANEOUS
  Filled 2015-01-06 (×6): qty 1

## 2015-01-06 MED ORDER — ROSUVASTATIN CALCIUM 40 MG PO TABS
40.0000 mg | ORAL_TABLET | Freq: Every day | ORAL | Status: DC
  Administered 2015-01-06 – 2015-01-07 (×2): 40 mg via ORAL
  Filled 2015-01-06 (×3): qty 1

## 2015-01-06 MED ORDER — CETYLPYRIDINIUM CHLORIDE 0.05 % MT LIQD
7.0000 mL | Freq: Two times a day (BID) | OROMUCOSAL | Status: DC
  Administered 2015-01-07 – 2015-01-08 (×2): 7 mL via OROMUCOSAL

## 2015-01-06 MED ORDER — POLYETHYLENE GLYCOL 3350 17 G PO PACK
17.0000 g | PACK | Freq: Every day | ORAL | Status: DC | PRN
  Filled 2015-01-06: qty 1

## 2015-01-06 MED ORDER — SODIUM CHLORIDE 0.9 % IJ SOLN: 3.0000 mL | Freq: Two times a day (BID) | INTRAMUSCULAR | Status: DC

## 2015-01-06 MED ORDER — ACETAMINOPHEN 500 MG PO TABS
1000.0000 mg | ORAL_TABLET | Freq: Once | ORAL | Status: AC
  Administered 2015-01-06: 1000 mg via ORAL
  Filled 2015-01-06: qty 2

## 2015-01-06 MED ORDER — PIPERACILLIN-TAZOBACTAM 3.375 G IVPB 30 MIN
3.3750 g | Freq: Once | INTRAVENOUS | Status: AC
  Administered 2015-01-06: 3.375 g via INTRAVENOUS
  Filled 2015-01-06: qty 50

## 2015-01-06 MED ORDER — SODIUM CHLORIDE 0.9 % IV BOLUS (SEPSIS)
1000.0000 mL | INTRAVENOUS | Status: AC
  Administered 2015-01-06 (×4): 1000 mL via INTRAVENOUS

## 2015-01-06 MED ORDER — SODIUM CHLORIDE 0.9 % IJ SOLN: 3.0000 mL | INTRAMUSCULAR | Status: DC | PRN

## 2015-01-06 MED ORDER — LISINOPRIL 40 MG PO TABS
40.0000 mg | ORAL_TABLET | Freq: Every day | ORAL | Status: DC
  Administered 2015-01-08: 40 mg via ORAL
  Filled 2015-01-06 (×2): qty 1

## 2015-01-06 MED ORDER — VANCOMYCIN HCL 10 G IV SOLR
1750.0000 mg | INTRAVENOUS | Status: DC
  Administered 2015-01-07 – 2015-01-08 (×2): 1750 mg via INTRAVENOUS
  Filled 2015-01-06 (×3): qty 1750

## 2015-01-06 MED ORDER — ACETAMINOPHEN 325 MG PO TABS
650.0000 mg | ORAL_TABLET | Freq: Four times a day (QID) | ORAL | Status: DC | PRN
  Administered 2015-01-07 (×2): 650 mg via ORAL
  Filled 2015-01-06 (×2): qty 2

## 2015-01-06 MED ORDER — SODIUM CHLORIDE 0.9 % IV SOLN: 250.0000 mL | INTRAVENOUS | Status: DC | PRN

## 2015-01-06 MED ORDER — TORSEMIDE 10 MG PO TABS
10.0000 mg | ORAL_TABLET | Freq: Every day | ORAL | Status: DC
  Administered 2015-01-07 – 2015-01-08 (×2): 10 mg via ORAL
  Filled 2015-01-06 (×2): qty 1

## 2015-01-06 MED ORDER — SODIUM CHLORIDE 0.9 % IV BOLUS (SEPSIS)
500.0000 mL | INTRAVENOUS | Status: AC
  Administered 2015-01-06: 500 mL via INTRAVENOUS

## 2015-01-06 MED ORDER — ASPIRIN 81 MG PO CHEW
81.0000 mg | CHEWABLE_TABLET | Freq: Every day | ORAL | Status: DC
  Administered 2015-01-07 – 2015-01-08 (×2): 81 mg via ORAL
  Filled 2015-01-06 (×2): qty 1

## 2015-01-06 MED ORDER — INSULIN GLARGINE 100 UNIT/ML ~~LOC~~ SOLN
95.0000 [IU] | Freq: Two times a day (BID) | SUBCUTANEOUS | Status: DC
  Administered 2015-01-06 – 2015-01-08 (×4): 95 [IU] via SUBCUTANEOUS
  Filled 2015-01-06 (×6): qty 0.95

## 2015-01-06 MED ORDER — PIPERACILLIN-TAZOBACTAM 3.375 G IVPB
3.3750 g | Freq: Three times a day (TID) | INTRAVENOUS | Status: DC
  Administered 2015-01-06 – 2015-01-08 (×4): 3.375 g via INTRAVENOUS
  Filled 2015-01-06 (×7): qty 50

## 2015-01-06 MED ORDER — INSULIN ASPART 100 UNIT/ML ~~LOC~~ SOLN
0.0000 [IU] | Freq: Three times a day (TID) | SUBCUTANEOUS | Status: DC
  Administered 2015-01-07 – 2015-01-08 (×3): 3 [IU] via SUBCUTANEOUS

## 2015-01-06 MED ORDER — PANTOPRAZOLE SODIUM 40 MG PO TBEC
80.0000 mg | DELAYED_RELEASE_TABLET | Freq: Every day | ORAL | Status: DC
  Administered 2015-01-07 – 2015-01-08 (×2): 80 mg via ORAL
  Filled 2015-01-06 (×2): qty 2

## 2015-01-06 MED ORDER — VANCOMYCIN HCL IN DEXTROSE 1-5 GM/200ML-% IV SOLN
1000.0000 mg | Freq: Once | INTRAVENOUS | Status: DC
  Filled 2015-01-06: qty 200

## 2015-01-06 NOTE — Progress Notes (Signed)
ANTIBIOTIC CONSULT NOTE - INITIAL  Pharmacy Consult for Vancomycin and Zosyn Indication: rule out sepsis  Allergies  Allergen Reactions  . Nitroglycerin Other (See Comments)    Drop in blood pressure, caused dizziness.    Patient Measurements:   Total Body weight: 150kg  Vital Signs: Temp: 103.2 F (39.6 C) (03/07 1127) Temp Source: Oral (03/07 1127) BP: 165/71 mmHg (03/07 1137) Pulse Rate: 93 (03/07 1137) Intake/Output from previous day:   Intake/Output from this shift:    Labs: No results for input(s): WBC, HGB, PLT, LABCREA, CREATININE in the last 72 hours. CrCl cannot be calculated (Unknown ideal weight.). No results for input(s): VANCOTROUGH, VANCOPEAK, VANCORANDOM, GENTTROUGH, GENTPEAK, GENTRANDOM, TOBRATROUGH, TOBRAPEAK, TOBRARND, AMIKACINPEAK, AMIKACINTROU, AMIKACIN in the last 72 hours.   Microbiology: No results found for this or any previous visit (from the past 720 hour(s)).  Medical History: Past Medical History  Diagnosis Date  . Diabetes mellitus   . Hyperlipidemia   . Hypertension   . Ventral hernia   . PROSTATE CANCER   . NEUROPATHY, DIABETIC   . GOUT, ACUTE   . OBESITY   . IMPOTENCE INORGANIC   . CORONARY, ARTERIOSCLEROSIS   . PERIPHERAL VASCULAR DISEASE WITH CLAUDICATION   . CLAUDICATION, INTERMITTENT   . DIVERTICULITIS OF COLON, NOS   . Chronic kidney disease (CKD), stage III (moderate)   . ARTHRITIS   . BACK PAIN, LUMBAR   . FATIGUE, CHRONIC   . ABDOMINAL AORTIC ANEURYSM REPAIR, HX OF   . MYOCARDIAL INFARCTION, OLD 2001  . OBSTRUCTIVE SLEEP APNEA     uses CPAP  setting of 7.2  . SLEEP APNEA   . GERD (gastroesophageal reflux disease)    Medications:  Scheduled:   Anti-infectives    Start     Dose/Rate Route Frequency Ordered Stop   01/06/15 1245  vancomycin (VANCOCIN) 2,500 mg in sodium chloride 0.9 % 500 mL IVPB     2,500 mg 250 mL/hr over 120 Minutes Intravenous  Once 01/06/15 1231     01/06/15 1215  piperacillin-tazobactam  (ZOSYN) IVPB 3.375 g     3.375 g 100 mL/hr over 30 Minutes Intravenous  Once 01/06/15 1213     01/06/15 1215  vancomycin (VANCOCIN) IVPB 1000 mg/200 mL premix  Status:  Discontinued     1,000 mg 200 mL/hr over 60 Minutes Intravenous  Once 01/06/15 1213 01/06/15 1228     Assessment: 74 yoM [facility] ED [facility], febrile to 103.2, desatted when laying flat. Hx of DM2, HTN, HPL, CKD III  Patient obese, begin Vancomycin and Zosyn for rule out sepsis  Goal of Therapy:  Vancomycin trough level 15-20 mcg/ml  Plan:   Vancomycin 2500mg  x1 dose, then 1750mg  q24  Zosyn 3.375gm q8- 4 hr infusion  Minda Ditto PharmD Pager [phone] 01/06/2015, 12:59 PM

## 2015-01-06 NOTE — Progress Notes (Signed)
Admission note:  Arrival Method: Patient arrived in stretcher with the carelink staff accompanying. Mental Orientation:  Alert and oriented x 4. Telemetry: 6E-26, NSR, CCMD notified. Assessment: In progress. Skin: Two nurses checked the skin Alcide Evener). Warm dry and intact except one boil noted in the right abdominal fold with little drainage and also another one noted in the right groin draining. Put 4x4 guaze on both of them.  IV: Left hand IV saline locked. Pain: Denied any pain. Safety Measures: Bed at low position, call bell within reach. Fall Prevention Safety Plan: Reviewed the safety plan, understood and acknowledged. Admission Screening: In progress. 6700 Orientation: Patient has been oriented to the unit, staff and to the room.

## 2015-01-06 NOTE — ED Notes (Addendum)
GC4 Lactic 3.73 Dr Winfred Leeds notified

## 2015-01-06 NOTE — ED Notes (Signed)
i attempted to collect labs twice and was unsuccessful.  I made nurse aware
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
## 2015-01-06 NOTE — ED Notes (Addendum)
Pt states he woke up this morning feeling shaky and with SOB. Denies CP, dizziness, or unilateral weakness. Pt became significantly more SOB after ambulating to room. O2 saturation 88% while laying for EKG, O2 saturation returned to and maintained at 94% on room air once patient sat up.

## 2015-01-07 DIAGNOSIS — R0602 Shortness of breath: Secondary | ICD-10-CM | POA: Insufficient documentation

## 2015-01-07 DIAGNOSIS — IMO0001 Reserved for inherently not codable concepts without codable children: Secondary | ICD-10-CM | POA: Insufficient documentation

## 2015-01-07 LAB — CBC
HCT: 38.1 % — ABNORMAL LOW (ref 39.0–52.0)
HEMOGLOBIN: 12.3 g/dL — AB (ref 13.0–17.0)
MCH: 27.8 pg (ref 26.0–34.0)
MCHC: 32.3 g/dL (ref 30.0–36.0)
MCV: 86 fL (ref 78.0–100.0)
PLATELETS: 161 10*3/uL (ref 150–400)
RBC: 4.43 MIL/uL (ref 4.22–5.81)
RDW: 15.3 % (ref 11.5–15.5)
WBC: 9.1 10*3/uL (ref 4.0–10.5)

## 2015-01-07 LAB — BASIC METABOLIC PANEL
Anion gap: 8 (ref 5–15)
BUN: 14 mg/dL (ref 6–23)
CO2: 23 mmol/L (ref 19–32)
CREATININE: 1.39 mg/dL — AB (ref 0.50–1.35)
Calcium: 8.1 mg/dL — ABNORMAL LOW (ref 8.4–10.5)
Chloride: 107 mmol/L (ref 96–112)
GFR, EST AFRICAN AMERICAN: 59 mL/min — AB (ref >90)
GFR, EST NON AFRICAN AMERICAN: 51 mL/min — AB (ref >90)
GLUCOSE: 136 mg/dL — AB (ref 70–99)
POTASSIUM: 4 mmol/L (ref 3.5–5.1)
Sodium: 138 mmol/L (ref 135–145)

## 2015-01-07 LAB — GLUCOSE, CAPILLARY
GLUCOSE-CAPILLARY: 169 mg/dL — AB (ref 70–99)
GLUCOSE-CAPILLARY: 159 mg/dL — AB (ref 70–99)
Glucose-Capillary: 144 mg/dL — ABNORMAL HIGH (ref 70–99)
Glucose-Capillary: 93 mg/dL (ref 70–99)

## 2015-01-07 MED ORDER — SALINE SPRAY 0.65 % NA SOLN
2.0000 | NASAL | Status: DC | PRN
  Administered 2015-01-07: 2 via NASAL
  Filled 2015-01-07: qty 44

## 2015-01-07 MED ORDER — TRAZODONE 25 MG HALF TABLET
25.0000 mg | ORAL_TABLET | Freq: Every day | ORAL | Status: DC
  Administered 2015-01-07 (×2): 25 mg via ORAL
  Filled 2015-01-07 (×3): qty 1

## 2015-01-07 MED ORDER — TORSEMIDE 20 MG PO TABS: 10.0000 mg | ORAL_TABLET | Freq: Every day | ORAL | Status: DC

## 2015-01-07 NOTE — Progress Notes (Addendum)
Pt found already wearing home cpap with nasal pillows and tolerating well at this time.  Pt stated he already has plenty of water in humidity chamber.  No frays on cord or obvious defects noted.  Pt was advised that RT is available all night should he need anything further.  Call placed to [employer], requesting [employer] inspect patient's home machine in the morning.

## 2015-01-07 NOTE — Progress Notes (Signed)
Order received to wean pt from oxygen. Pt saturation between 94-96% on RA throughout duration of shift.

## 2015-01-07 NOTE — Progress Notes (Signed)
Family Medicine Teaching Service Daily Progress Note Intern Pager: [phone]  Patient name: Adam [name] Medical record number: [id number] Date of birth: [birth year] Age: 69 y.o. Gender: male  Primary Care Provider: Kenn File, MD Consultants: None Code Status: Full  Pt Overview and Major Events to Date:  3/7: Admitted for possible sepsis  Assessment and Plan: Kelvyn Toelle is a 68 y.o. male presenting with fever and rigors . PMH is significant for abscess of the scrotum, organic impotence, hx of prostate cancer s/p radiation therapy, CAD s/p MI, PAD, depression, T2DM, diabetic neuropathy, gout, HTN, abdominal aortic aneurysm repair for dilation or occlusion, incisional hernia repair w/ implantation of mesh, knee replacement, and OSA.   #Fever: one day history of fever that started this morning. Received fluids and ABX at [facility] ED. Initially temperature 103.2 but afebrile since then. Mild leukocytosis but concerning for elevated lactic acid (3.73) but has since normalized. CXR and UA clear. Perineal boils and boil under pannus but don't appear as the source of infection. He has been on doxycycline for the past two months by his dermatologist. Patient was placed on 2L [place] for shortness of breath but no documented hypoxemia.  - telemtry  - Vanc and Zosyn continue at this time - will consider d/c if no source of infection identified - cultures pending - BCx so far no growth - tylenol for fever  - CBC with improved leukocytosis. 10.9>9.1 today - wean to room air   #AKI/CKD II-III: Scr 1.45 on admission baseline is roughly 1. Likely related associated with septic picture of fever but lactic acid has improved with fluids and ABX. Improving Cr today 1.39. - encourage PO  - trend bmet  #DM2: most recent Hgb A1c 8.7.  - lantus 95 U BID  - SSI  -repeat A1c  #HTN: controlled  - home meds   #CAD, PAD, HTN, MI in 2001, Aneurysm repair surgically with bi-external iliac bypass:  appears stable. No chest pain.  - holding pletal  - EKG am  - continue crestor, lisinopril, metoprolol   FEN/GI: Carb modified  Prophylaxis: sub Q hep  Disposition: Continue current management as above; possible home tomorrow.  Subjective:  Patient doing well this morning.He states he feels like he still needs his oxygen. Is no longer endorsing rigors. He declined his CPAP last night but states that he wears it at home. Patient also endorsing morning headache.   Objective: Temp:  [97.8 F (36.6 C)-103.2 F (39.6 C)] 99.2 F (37.3 C) (03/08 0500) Pulse Rate:  [77-93] 83 (03/08 0500) Resp:  [18-25] 20 (03/08 0500) BP: (113-165)/(42-71) 128/56 mmHg (03/08 0500) SpO2:  [92 %-99 %] 94 % (03/08 0500) Weight:  [340 lb 6.4 oz (154.404 kg)] 340 lb 6.4 oz (154.404 kg) (03/07 2100) Physical Exam: General: alert, morbidly obese, no distress  HEENT: EOMI, MMM, NCAT Cardiovascular: RRR, S1S2, no murmurs, rubs  Respiratory: on 2L , normal effort, CTAB Abdomen: protuberant, soft, NT, +BS  Extremities: moves all freely, trace edema, WWP Skin: no other rashes  Neuro: alert and oriented, no gross deficits.   Laboratory: Results for orders placed or performed during the hospital encounter of 01/06/15 (from the past 24 hour(s))  Blood Culture (routine x 2)     Status: None (Preliminary result)   Collection Time: 01/06/15 12:37 PM  Result Value Ref Range   Specimen Description BLOOD RIGHT ANTECUBITAL    Special Requests BOTTLES DRAWN AEROBIC AND ANAEROBIC [facility] EACH    Culture  BLOOD CULTURE RECEIVED NO GROWTH TO DATE CULTURE WILL BE HELD FOR 5 DAYS BEFORE ISSUING A FINAL NEGATIVE REPORT Performed at [facility]    Report Status PENDING   CBC WITH DIFFERENTIAL     Status: Abnormal   Collection Time: 01/06/15 12:38 PM  Result Value Ref Range   WBC 10.9 (H) 4.0 - 10.5 K/uL   RBC 5.24 4.22 - 5.81 MIL/uL   Hemoglobin 15.2 13.0 - 17.0 g/dL   HCT 45.4 39.0 - 52.0 %    MCV 86.6 78.0 - 100.0 fL   MCH 29.0 26.0 - 34.0 pg   MCHC 33.5 30.0 - 36.0 g/dL   RDW 14.6 11.5 - 15.5 %   Platelets 188 150 - 400 K/uL   Neutrophils Relative % 94 (H) 43 - 77 %   Neutro Abs 10.2 (H) 1.7 - 7.7 K/uL   Lymphocytes Relative 4 (L) 12 - 46 %   Lymphs Abs 0.4 (L) 0.7 - 4.0 K/uL   Monocytes Relative 2 (L) 3 - 12 %   Monocytes Absolute 0.2 0.1 - 1.0 K/uL   Eosinophils Relative 0 0 - 5 %   Eosinophils Absolute 0.0 0.0 - 0.7 K/uL   Basophils Relative 0 0 - 1 %   Basophils Absolute 0.0 0.0 - 0.1 K/uL  Comprehensive metabolic panel     Status: Abnormal   Collection Time: 01/06/15 12:38 PM  Result Value Ref Range   Sodium 134 (L) 135 - 145 mmol/L   Potassium 4.1 3.5 - 5.1 mmol/L   Chloride 101 96 - 112 mmol/L   CO2 26 19 - 32 mmol/L   Glucose, Bld 188 (H) 70 - 99 mg/dL   BUN 16 6 - 23 mg/dL   Creatinine, Ser 1.45 (H) 0.50 - 1.35 mg/dL   Calcium 9.0 8.4 - 10.5 mg/dL   Total Protein 7.7 6.0 - 8.3 g/dL   Albumin 3.8 3.5 - 5.2 g/dL   AST 28 0 - 37 U/L   ALT 25 0 - 53 U/L   Alkaline Phosphatase 87 39 - 117 U/L   Total Bilirubin 1.0 0.3 - 1.2 mg/dL   GFR calc non Af Amer 48 (L) >90 mL/min   GFR calc Af Amer 56 (L) >90 mL/min   Anion gap 7 5 - 15  I-Stat CG4 Lactic Acid, ED (not at [facility])     Status: Abnormal   Collection Time: 01/06/15 12:48 PM  Result Value Ref Range   Lactic Acid, Venous 3.73 (HH) 0.5 - 2.0 mmol/L   Comment NOTIFIED PHYSICIAN   Blood Culture (routine x 2)     Status: None (Preliminary result)   Collection Time: 01/06/15 12:49 PM  Result Value Ref Range   Specimen Description BLOOD LEFT HAND    Special Requests BOTTLES DRAWN AEROBIC AND ANAEROBIC 5CC    Culture             BLOOD CULTURE RECEIVED NO GROWTH TO DATE CULTURE WILL BE HELD FOR 5 DAYS BEFORE ISSUING A FINAL NEGATIVE REPORT Performed at [facility]    Report Status PENDING   Urinalysis, Routine w reflex microscopic     Status: None   Collection Time: 01/06/15  1:04 PM  Result  Value Ref Range   Color, Urine YELLOW YELLOW   APPearance CLEAR CLEAR   Specific Gravity, Urine 1.011 1.005 - 1.030   pH 5.0 5.0 - 8.0   Glucose, UA NEGATIVE NEGATIVE mg/dL   Hgb urine dipstick NEGATIVE NEGATIVE  Bilirubin Urine NEGATIVE NEGATIVE   Ketones, ur NEGATIVE NEGATIVE mg/dL   Protein, ur NEGATIVE NEGATIVE mg/dL   Urobilinogen, UA 0.2 0.0 - 1.0 mg/dL   Nitrite NEGATIVE NEGATIVE   Leukocytes, UA NEGATIVE NEGATIVE  I-Stat CG4 Lactic Acid, ED (not at [facility])     Status: None   Collection Time: 01/06/15  3:57 PM  Result Value Ref Range   Lactic Acid, Venous 1.91 0.5 - 2.0 mmol/L  CBG monitoring, ED     Status: Abnormal   Collection Time: 01/06/15  5:12 PM  Result Value Ref Range   Glucose-Capillary 119 (H) 70 - 99 mg/dL  Glucose, capillary     Status: Abnormal   Collection Time: 01/06/15  9:21 PM  Result Value Ref Range   Glucose-Capillary 149 (H) 70 - 99 mg/dL  Basic metabolic panel     Status: Abnormal   Collection Time: 01/07/15  7:05 AM  Result Value Ref Range   Sodium 138 135 - 145 mmol/L   Potassium 4.0 3.5 - 5.1 mmol/L   Chloride 107 96 - 112 mmol/L   CO2 23 19 - 32 mmol/L   Glucose, Bld 136 (H) 70 - 99 mg/dL   BUN 14 6 - 23 mg/dL   Creatinine, Ser 1.39 (H) 0.50 - 1.35 mg/dL   Calcium 8.1 (L) 8.4 - 10.5 mg/dL   GFR calc non Af Amer 51 (L) >90 mL/min   GFR calc Af Amer 59 (L) >90 mL/min   Anion gap 8 5 - 15  CBC     Status: Abnormal   Collection Time: 01/07/15  7:05 AM  Result Value Ref Range   WBC 9.1 4.0 - 10.5 K/uL   RBC 4.43 4.22 - 5.81 MIL/uL   Hemoglobin 12.3 (L) 13.0 - 17.0 g/dL   HCT 38.1 (L) 39.0 - 52.0 %   MCV 86.0 78.0 - 100.0 fL   MCH 27.8 26.0 - 34.0 pg   MCHC 32.3 30.0 - 36.0 g/dL   RDW 15.3 11.5 - 15.5 %   Platelets 161 150 - 400 K/uL    Imaging/Diagnostic Tests: Dg Chest 2 View  01/06/2015   CLINICAL DATA:  Shortness of breath this morning.  EXAM: CHEST  2 VIEW  COMPARISON:  05/16/2012  FINDINGS: Slight elevation of the right  hemidiaphragm is stable. Chronic right base atelectasis or scarring. No confluent opacity on the left. Heart is normal size. No effusions or acute bony abnormality.  IMPRESSION: Chronic right basilar atelectasis or scarring.  No acute findings.   Electronically Signed   By: Rolm Baptise M.D.   On: 01/06/2015 12:08    Katheren Shams, DO 01/07/2015, 8:04 AM PGY-1, [place] Intern pager: [phone], text pages welcome

## 2015-01-07 NOTE — Discharge Summary (Signed)
[facility] Discharge Summary  Patient name: Adam [name] Medical record number: [id number] Date of birth: [birth year] Age: 69 y.o. Gender: male Date of Admission: 01/06/2015  Date of Discharge:  Admitting Physician: Alveda Reasons, MD  Primary Care Provider: Kenn File, MD Consultants: None  Indication for Hospitalization: Fever and Rigor  Discharge Diagnoses/Problem List:  Patient Active Problem List   Diagnosis Date Noted  . Blood poisoning   . SOB (shortness of breath)   . Sepsis 01/06/2015  . Fever and chills 01/06/2015  . Healthcare maintenance 10/09/2014  . Foot cramps 04/04/2014  . Skin lesion 04/04/2014  . Erectile dysfunction 04/02/2014  . Pain in joint, shoulder region 09/05/2013  . Back pain 05/18/2013  . GERD (gastroesophageal reflux disease) 03/20/2013  . Unspecified vitamin D deficiency 03/07/2013  . Embedded toenail 08/04/2012  . Scrotum, abscess 08/04/2012  . Preop cardiovascular exam 03/03/2012  . Ventral hernia 10/06/2011  . Localized swelling, mass, or lump of lower extremity 05/16/2011  . OBSTRUCTIVE SLEEP APNEA 02/10/2010  . SLEEP APNEA 12/30/2009  . FATIGUE, CHRONIC 12/30/2009  . PAD (peripheral artery disease) 03/05/2009  . ABDOMINAL AORTIC ANEURYSM REPAIR, HX OF 03/05/2009  . OBESITY 01/01/2009  . Chronic kidney disease (CKD), stage III (moderate) 01/12/2008  . BACK PAIN, LUMBAR 09/05/2007  . PROSTATE CANCER 12/29/2006  . Diabetes mellitus out of control 12/29/2006  . NEUROPATHY, DIABETIC 12/29/2006  . Hyperlipidemia 12/29/2006  . Gouty arthropathy 12/29/2006  . IMPOTENCE INORGANIC 12/29/2006  . HYPERTENSION, BENIGN SYSTEMIC 12/29/2006  . MYOCARDIAL INFARCTION, OLD 12/29/2006  . Coronary atherosclerosis 12/29/2006  . CLAUDICATION, INTERMITTENT 12/29/2006  . DIVERTICULITIS OF COLON, NOS 12/29/2006  . ARTHRITIS 12/29/2006   Disposition: Home  Discharge Condition: Improved  Discharge Exam:  Filed  Vitals:   01/08/15 1000  BP: 117/96  Pulse: 62  Temp: 98.2 F (36.8 C)  Resp: 18   General: alert, morbidly obese, no distress  Cardiovascular: RRR, S1S2, no murmurs, rubs  Respiratory: normal effort, CTAB Abdomen: protuberant, soft, NT, +BS, boil under panus on right skin fold  GU: boil in perineal region. Looks clean and not infected  Skin: no other rashes or lesions, WWP Neuro: alert and oriented, no gross deficits.   Brief Hospital Course:  Adam [name] is a 69 y.o. male presenting with fever and rigors . PMH is significant for abscess of the scrotum, organic impotence, hx of prostate cancer s/p radiation therapy, CAD s/p MI, PAD, depression, T2DM, diabetic neuropathy, gout, HTN, abdominal aortic aneurysm repair for dilation or occlusion, incisional hernia repair w/ implantation of mesh, knee replacement, and OSA.  Patient went to ER and found to have temp of 103 and met sepsis criteria.Received fluids and Abx (Vanc/Zoysn) for presumed sepsis. Mild leukocytosis and elevated lactic acid (3.73) but normalized after initiation of treatment. CXR and UA unremarkable. Blood cultures collected with no growth for 48hrs. Abx were discontinued. Unknown source of fevers; there is no obvious source of infection.  Possible sources include skin source: has known boils in groin, panis area, and is on chronic doxycycline for them. None were particularly worrisome for acute infection.During hospitalization patient remained stable and had no more fevers.   The patient's other chronic conditions were stable during this hospitalization and the patient was continued on home medications.    Issues for Follow Up:  1. Consider adjusting his home insulin regimen to more of a 50/50 regimen of basal and bolus insulin.  2. Dermatology follow-up for need of doxycycline 3. Consider more  extensive work-up if continued fevers  Significant Procedures: None  Significant Labs and Imaging:   Recent  Labs Lab 01/06/15 1238 01/07/15 0705 01/08/15 1035  WBC 10.9* 9.1 5.4  HGB 15.2 12.3* 12.4*  HCT 45.4 38.1* 38.5*  PLT 188 161 146*    Recent Labs Lab 01/06/15 1238 01/07/15 0705 01/08/15 1035  NA 134* 138 136  K 4.1 4.0 3.7  CL 101 107 105  CO2 26 23 24   GLUCOSE 188* 136* 217*  BUN 16 14 12   CREATININE 1.45* 1.39* 1.35  CALCIUM 9.0 8.1* 8.3*  ALKPHOS 87  --   --   AST 28  --   --   ALT 25  --   --   ALBUMIN 3.8  --   --     Results/Tests Pending at Time of Discharge: None  Discharge Medications:    Medication List    STOP taking these medications        doxycycline 100 MG capsule  Commonly known as:  VIBRAMYCIN      TAKE these medications        BAYER CHILDRENS ASPIRIN 81 MG chewable tablet  Generic drug:  aspirin  Chew 81 mg by mouth daily.     cilostazol 100 MG tablet  Commonly known as:  PLETAL  Take 1 tablet (100 mg total) by mouth 2 (two) times daily.     CRESTOR 40 MG tablet  Generic drug:  rosuvastatin  Take 40 mg by mouth at bedtime.     esomeprazole 40 MG capsule  Commonly known as:  NEXIUM  Take 1 capsule (40 mg total) by mouth daily.     glucose blood test strip  Commonly known as:  ONE TOUCH ULTRA TEST  Use four times daily     insulin glargine 100 UNIT/ML injection  Commonly known as:  LANTUS  Inject 0.95 mLs (95 Units total) into the skin 2 (two) times daily. QS one month supply     insulin lispro 100 UNIT/ML injection  Commonly known as:  HUMALOG  Inject 0.1-0.2 mLs (10-20 Units total) into the skin 3 (three) times daily before meals.     Insulin Pen Needle 31G X 8 MM Misc  Commonly known as:  B-D ULTRAFINE III SHORT PEN  1 Syringe by Does not apply route 5 (five) times daily.     INSULIN SYRINGE .5CC/30GX1/2" 30G X 1/2" 0.5 ML Misc  1 Device by Does not apply route as needed.     lisinopril 40 MG tablet  Commonly known as:  PRINIVIL,ZESTRIL  Take 1 tablet (40 mg total) by mouth daily.     metFORMIN 500 MG tablet   Commonly known as:  GLUCOPHAGE  Take 1 tablet (500 mg total) by mouth daily with breakfast.     metoprolol 100 MG tablet  Commonly known as:  LOPRESSOR  Take 1 tablet (100 mg total) by mouth 2 (two) times daily.     mometasone 50 MCG/ACT nasal spray  Commonly known as:  NASONEX  Place 2 sprays into the nose daily.     ONE TOUCH ULTRA SYSTEM KIT W/DEVICE Kit  1 kit by Does not apply route once.     torsemide 20 MG tablet  Commonly known as:  DEMADEX  Take 0.5 tablets (10 mg total) by mouth daily.        Discharge Instructions: Please refer to Patient Instructions section of EMR for full details.  Patient was counseled important signs and symptoms that should  prompt return to medical care, changes in medications, dietary instructions, activity restrictions, and follow up appointments.   Follow-Up Appointments: Follow-up Information    Follow up with Kenn File, MD On 01/17/2015.   Specialty:  Family Medicine   Why:  @2 :40 for hosp fu   Contact information:   [place] [address] [phone]       Katheren Shams, DO 01/08/2015, 12:04 PM PGY-1, [place]

## 2015-01-08 DIAGNOSIS — T887XXD Unspecified adverse effect of drug or medicament, subsequent encounter: Secondary | ICD-10-CM

## 2015-01-08 LAB — BASIC METABOLIC PANEL
Anion gap: 7 (ref 5–15)
BUN: 12 mg/dL (ref 6–23)
CALCIUM: 8.3 mg/dL — AB (ref 8.4–10.5)
CO2: 24 mmol/L (ref 19–32)
Chloride: 105 mmol/L (ref 96–112)
Creatinine, Ser: 1.35 mg/dL (ref 0.50–1.35)
GFR calc non Af Amer: 53 mL/min — ABNORMAL LOW (ref >90)
GFR, EST AFRICAN AMERICAN: 61 mL/min — AB (ref >90)
Glucose, Bld: 217 mg/dL — ABNORMAL HIGH (ref 70–99)
Potassium: 3.7 mmol/L (ref 3.5–5.1)
Sodium: 136 mmol/L (ref 135–145)

## 2015-01-08 LAB — HEMOGLOBIN A1C
HEMOGLOBIN A1C: 9.4 % — AB (ref 4.8–5.6)
Mean Plasma Glucose: 223 mg/dL

## 2015-01-08 LAB — CBC
HEMATOCRIT: 38.5 % — AB (ref 39.0–52.0)
HEMOGLOBIN: 12.4 g/dL — AB (ref 13.0–17.0)
MCH: 27.8 pg (ref 26.0–34.0)
MCHC: 32.2 g/dL (ref 30.0–36.0)
MCV: 86.3 fL (ref 78.0–100.0)
Platelets: 146 10*3/uL — ABNORMAL LOW (ref 150–400)
RBC: 4.46 MIL/uL (ref 4.22–5.81)
RDW: 15.4 % (ref 11.5–15.5)
WBC: 5.4 10*3/uL (ref 4.0–10.5)

## 2015-01-08 LAB — GLUCOSE, CAPILLARY
Glucose-Capillary: 105 mg/dL — ABNORMAL HIGH (ref 70–99)
Glucose-Capillary: 66 mg/dL — ABNORMAL LOW (ref 70–99)
Glucose-Capillary: 189 mg/dL — ABNORMAL HIGH (ref 70–99)

## 2015-01-08 LAB — URINE CULTURE
COLONY COUNT: NO GROWTH
CULTURE: NO GROWTH

## 2015-01-08 NOTE — Discharge Instructions (Signed)
Discharge Date: 01/08/2015  Reason for Hospitalization: Fever of Unknown Origin  We are unsure of what caused your fever. Our best guess was your doxycycline. We have discontinued this medication. Please follow-up with your dermatologist to re-evaluate need of this medication. Please follow up with your PCP as scheduled.   Return to hospital if symptoms return.  Thank you for letting us participate in your care!

## 2015-01-08 NOTE — Care Management Note (Signed)
CARE MANAGEMENT NOTE 01/08/2015  Patient:  Adam [name], Adam [name]   Account Number:  [id number]  Date Initiated:  01/08/2015  Documentation initiated by:  Catalaya Garr  Subjective/Objective Assessment:   CM following for progression and d/c planning.     Action/Plan:   Met with pt and family no d/c needs identified.   Anticipated DC Date:  01/08/2015   Anticipated DC Plan:  HOME/SELF CARE         Choice offered to / List presented to:             Status of service:  Completed, signed off Medicare Important Message given?  YES (If response is "NO", the following Medicare IM given date fields will be blank) Date Medicare IM given:  01/08/2015 Medicare IM given by:  Aanyah Loa Date Additional Medicare IM given:   Additional Medicare IM given by:    Discharge Disposition:  HOME/SELF CARE  Per UR Regulation:    If discussed at Long Length of Stay Meetings, dates discussed:    Comments:

## 2015-01-08 NOTE — Progress Notes (Signed)
Patient Discharge: Disposition: Patient discharged home. Education:  Educated about medications, prescriptions, follow-up appointments, and discharge instructions.  IV: Discontinued before discharge. Telemetry: Discontinued before discharge, CCMD notified. Transportation: Transported in w/c with staff and family accompanying. Belongings: Patient took all his belongings with him.

## 2015-01-08 NOTE — Progress Notes (Signed)
Hypoglycemic Event  CBG: 66  Treatment: 15 GM carbohydrate snack  Symptoms: None  Follow-up CBG: Time:0805 CBG Result:105  Possible Reasons for Event: Unknown  Comments/MD notified:yes    Corynne Scibilia  Remember to initiate Hypoglycemia Order Set & complete

## 2015-01-08 NOTE — Progress Notes (Addendum)
Inpatient Diabetes Program Recommendations  AACE/ADA: New Consensus Statement on Inpatient Glycemic Control (2013)  Target Ranges:  Prepandial:   less than 140 mg/dL      Peak postprandial:   less than 180 mg/dL (1-2 hours)      Critically ill patients:  140 - 180 mg/dL     Results for Adam [name], Adam [name] (MRN [id number]) as of 01/08/2015 08:59  Ref. Range 01/07/2015 08:09 01/07/2015 11:48 01/07/2015 16:30 01/07/2015 21:59  Glucose-Capillary Latest Range: 70-99 mg/dL 144 (H) 169 (H) 159 (H) 93    Results for Adam [name], Adam [name] (MRN [id number]) as of 01/08/2015 08:59  Ref. Range 01/08/2015 07:44 01/08/2015 08:12  Glucose-Capillary Latest Range: 70-99 mg/dL 66 (L) 105 (H)    Results for Adam [name], Adam [name] (MRN [id number]) as of 01/08/2015 08:59  Ref. Range 01/07/2015 04:20  Hemoglobin A1C Latest Range: 4.8-5.6 % 9.4 (H)     Chief Complaint: Fever  History: DM, CAD, MI, HTN  Home DM Meds: Lantus 95 units bid        Humalog 10-20 units tid per SSI  Current DM Orders: Lantus 95 units bid    Novolog Moderate SSI    **Mild Hypoglycemia this AM (CBG 66 mg/dl).  Patient asymptomatic per RN notes.  **Note A1c shows sub-optimal control at home.  Needs close follow- up with PCP with Family Medicine clinic.    MD- Please consider decreasing Lantus slightly to 90 units bid if patient continues to have any more issues with Hypoglycemia  Addendum/Late Entry 3PM: Spoke to patient about his current A1c of 9.4%.  Explained what an A1c is and what it measures.  Reminded patient that his goal A1c is 7% or less per ADA standards to prevent both acute and long-term complications.  Explained to patient the extreme importance of good glucose control at home.  Encouraged patient to check his CBGs at least tid at home and to record all CBGs in a logbook for their PCP to review.      Will follow Wyn Quaker RN, MSN, CDE Diabetes Coordinator Inpatient Diabetes Program Team Pager: [phone] (8a-10p)

## 2015-01-09 DIAGNOSIS — T50905A Adverse effect of unspecified drugs, medicaments and biological substances, initial encounter: Secondary | ICD-10-CM | POA: Insufficient documentation

## 2015-01-12 LAB — CULTURE, BLOOD (ROUTINE X 2)
CULTURE: NO GROWTH
CULTURE: NO GROWTH

## 2015-01-17 ENCOUNTER — Ambulatory Visit (INDEPENDENT_AMBULATORY_CARE_PROVIDER_SITE_OTHER): Payer: Medicare Other | Admitting: Family Medicine

## 2015-01-17 VITALS — BP 162/80 | HR 59 | Temp 98.3°F | Ht 76.0 in | Wt 317.2 lb

## 2015-01-17 DIAGNOSIS — T887XXD Unspecified adverse effect of drug or medicament, subsequent encounter: Secondary | ICD-10-CM

## 2015-01-17 DIAGNOSIS — R509 Fever, unspecified: Secondary | ICD-10-CM | POA: Diagnosis not present

## 2015-01-17 DIAGNOSIS — I10 Essential (primary) hypertension: Secondary | ICD-10-CM | POA: Diagnosis not present

## 2015-01-17 DIAGNOSIS — E1165 Type 2 diabetes mellitus with hyperglycemia: Secondary | ICD-10-CM

## 2015-01-17 DIAGNOSIS — T50905D Adverse effect of unspecified drugs, medicaments and biological substances, subsequent encounter: Secondary | ICD-10-CM

## 2015-01-17 DIAGNOSIS — IMO0002 Reserved for concepts with insufficient information to code with codable children: Secondary | ICD-10-CM

## 2015-01-17 DIAGNOSIS — G479 Sleep disorder, unspecified: Secondary | ICD-10-CM

## 2015-01-17 DIAGNOSIS — G4700 Insomnia, unspecified: Secondary | ICD-10-CM | POA: Insufficient documentation

## 2015-01-17 MED ORDER — INSULIN LISPRO 100 UNIT/ML (KWIKPEN): 20.0000 [IU] | PEN_INJECTOR | Freq: Three times a day (TID) | SUBCUTANEOUS | Status: DC

## 2015-01-17 MED ORDER — LISINOPRIL 20 MG PO TABS: 20.0000 mg | ORAL_TABLET | Freq: Every day | ORAL | Status: DC

## 2015-01-17 MED ORDER — TRAZODONE HCL 50 MG PO TABS: 50.0000 mg | ORAL_TABLET | Freq: Every evening | ORAL | Status: DC | PRN

## 2015-01-17 NOTE — Assessment & Plan Note (Signed)
Difficulty sleeping since leaving the hospital Some improvement with, p.m. still early awakening Trial of trazodone

## 2015-01-17 NOTE — Assessment & Plan Note (Signed)
Unclear source of fever, patient thinks it is due to doxycycline but he is unsure as he was using this so frequently in the past Doing well at home, no recurrence I added doxycycline to the allergy list with the caveat that there is a suspicion of a febrile reaction associated with it

## 2015-01-17 NOTE — Patient Instructions (Signed)
Great to see you! You are making good changes tio your diet, keep it up! Try to get more consistent with Lantus, change to 50 units twice daily   Lets follow up in 4-6 weeks.

## 2015-01-17 NOTE — Assessment & Plan Note (Signed)
Lamented today 262/80, no red flags-no signs of endorgan damage After lengthy discussion it turns out he has been noncompliant with lisinopril due to feelings of hypotension when he takes it Decrease dose of lisinopril to 20 mg daily, patient's agreeable with this Follow-up 4-6 weeks Continue metoprolol and torsemide

## 2015-01-17 NOTE — Assessment & Plan Note (Addendum)
A1c while he was admitted as collected and found to be 9.4 He says that he's made drastic last several changes and as a result has had to cut back his Lantus dose In order to get more consistent Lantus I have accordingly cut back his dose to 50 units twice daily We discussed titration based on fasting blood sugars and he understands this well Continue Humalog 20 units before meals I would like to titrate his metformin, however he has limitations due to his stage III kidney disease Follow-up 4-6 weeks

## 2015-01-17 NOTE — Assessment & Plan Note (Signed)
Recent admission for fevers with no clear infective source thought to be due to doxycycline Added doxycycline to allergy list

## 2015-01-17 NOTE — Progress Notes (Signed)
Patient ID: Adam [name], male   DOB: 04/11/1946, 69 y.o.   MRN: [id number]   HPI  Patient presents today for hospital follow-up  Patient was admitted to the hospital on March 7 for high fever and rigors, no infective source was found, urine and blood cultures were negative. Was felt that his fever was due to drug reaction associated with prolonged use of doxycycline.  He states that since he got home he is feeling better and has not had any more fevers.  His sleep is interrupted and he requests something for sleep today. He is using Tylenol PM with only a little bit of help.  Hypertension Elevated today  it turns out he is not using lisinopril consistently due to feelings of hypo-tension whenever he takes it. He requests a decrease dose Taking metoprolol No headache, chest pain, dyspnea, leg edema, palpitations  Diabetes Has drastically cut back his carb intake due to A1c of 9.4 in the hospital. Has also accordingly cut back his Lantus dose due to more normal blood sugars. Previously taking 95 units twice daily, now he's taking 95 units once daily and occasionally taking it twice daily based on his fasting blood sugar he is not taking his Humalog consistently either. Fasting blood sugars range from 120-140, 2 hour postprandial blood sugars 125-175 Takes metformin every day but limited in dosage due to his renal function.  Smoking status noted ROS: Per HPI  Objective: BP 162/80 mmHg  Pulse 59  Temp(Src) 98.3 F (36.8 C) (Oral)  Ht 6\' 4"  (1.93 m)  Wt 317 lb 3.2 oz (143.881 kg)  BMI 38.63 kg/m2 Gen: NAD, alert, cooperative with exam HEENT: NCAT, flesh-colored nodule medial to his left eye between his left eye and nasal bridge, slight conjunctival injection on the left CV: RRR, good S1/S2, no murmur Resp: CTABL, no wheezes, non-labored Ext: No edema, warm Neuro: Alert and oriented, No gross deficits  Assessment and plan:  Fever and chills Unclear source of fever, patient  thinks it is due to doxycycline but he is unsure as he was using this so frequently in the past Doing well at home, no recurrence I added doxycycline to the allergy list with the caveat that there is a suspicion of a febrile reaction associated with it   Diabetes mellitus out of control A1c while he was admitted as collected and found to be 9.4 He says that he's made drastic last several changes and as a result has had to cut back his Lantus dose In order to get more consistent Lantus I have accordingly cut back his dose to 50 units twice daily We discussed titration based on fasting blood sugars and he understands this well Continue Humalog 20 units before meals I would like to titrate his metformin, however he has limitations due to his stage III kidney disease Follow-up 4-6 weeks   Drug reaction Recent admission for fevers with no clear infective source thought to be due to doxycycline Added doxycycline to allergy list   HYPERTENSION, BENIGN SYSTEMIC Lamented today 262/80, no red flags-no signs of endorgan damage After lengthy discussion it turns out he has been noncompliant with lisinopril due to feelings of hypotension when he takes it Decrease dose of lisinopril to 20 mg daily, patient's agreeable with this Follow-up 4-6 weeks Continue metoprolol and torsemide   Trouble in sleeping Difficulty sleeping since leaving the hospital Some improvement with, p.m. still early awakening Trial of trazodone       Meds ordered this encounter  Medications  .  lisinopril (PRINIVIL,ZESTRIL) 20 MG tablet    Sig: Take 1 tablet (20 mg total) by mouth daily.    Dispense:  90 tablet    Refill:  3  . traZODone (DESYREL) 50 MG tablet    Sig: Take 1-2 tablets (50-100 mg total) by mouth at bedtime as needed for sleep.    Dispense:  30 tablet    Refill:  3  . insulin lispro (HUMALOG KWIKPEN) 100 UNIT/ML KiwkPen    Sig: Inject 0.2 mLs (20 Units total) into the skin 3 (three) times daily.  Up to 50 units/day as directed by MD    Dispense:  5 pen    Refill:  3    PLease dispense QS for 1 month

## 2015-01-18 ENCOUNTER — Other Ambulatory Visit: Payer: Self-pay | Admitting: Family Medicine

## 2015-01-20 DIAGNOSIS — H0289 Other specified disorders of eyelid: Secondary | ICD-10-CM | POA: Diagnosis not present

## 2015-01-20 DIAGNOSIS — E119 Type 2 diabetes mellitus without complications: Secondary | ICD-10-CM | POA: Diagnosis not present

## 2015-01-20 DIAGNOSIS — H25813 Combined forms of age-related cataract, bilateral: Secondary | ICD-10-CM | POA: Diagnosis not present

## 2015-01-20 DIAGNOSIS — B0052 Herpesviral keratitis: Secondary | ICD-10-CM | POA: Diagnosis not present

## 2015-01-27 ENCOUNTER — Other Ambulatory Visit: Payer: Self-pay | Admitting: *Deleted

## 2015-01-27 MED ORDER — METFORMIN HCL 500 MG PO TABS: 500.0000 mg | ORAL_TABLET | Freq: Every day | ORAL | Status: DC

## 2015-01-30 ENCOUNTER — Ambulatory Visit (INDEPENDENT_AMBULATORY_CARE_PROVIDER_SITE_OTHER): Payer: Medicare Other | Admitting: Family Medicine

## 2015-01-30 ENCOUNTER — Encounter: Payer: Self-pay | Admitting: Family Medicine

## 2015-01-30 VITALS — BP 179/73 | HR 61 | Temp 98.1°F | Ht 76.0 in | Wt 325.5 lb

## 2015-01-30 DIAGNOSIS — I809 Phlebitis and thrombophlebitis of unspecified site: Secondary | ICD-10-CM | POA: Diagnosis not present

## 2015-01-30 DIAGNOSIS — H25813 Combined forms of age-related cataract, bilateral: Secondary | ICD-10-CM | POA: Diagnosis not present

## 2015-01-30 DIAGNOSIS — B0052 Herpesviral keratitis: Secondary | ICD-10-CM | POA: Diagnosis not present

## 2015-01-30 NOTE — Assessment & Plan Note (Signed)
Right dorsum of hand.  No evidence of infection. -Encouraged continuation of ASA 81mg  -Reassured that this is benign but given precautions for return (increasing redness/swelling/pain/SOB/swelling elsewhere along the RUE) -Explained that this would likely resolve in the next month or so. -Patient voiced good understanding of plan and has appt with Dr Wendi Snipes 4/19.  -Precepted with Dr Andria Frames

## 2015-01-30 NOTE — Patient Instructions (Signed)
It was a pleasure seeing you today, Adam [name]!  Information regarding what we discussed is included in this packet.  Please see [place] if your hand gets worse, if you develop fever or shortness of breath.  Continue taking your aspirin daily.   Please feel free to call our office at [phone] if any questions or concerns arise.  Warm Regards, Willie Plain M. Nelton Amsden, DO  Phlebitis Phlebitis is soreness and puffiness (swelling) in a vein.  HOME CARE  Only take medicine as told by your doctor.  Raise (elevate) the affected limb on a pillow as told by your doctor.  Keep a warm pack on the affected vein as told by your doctor. Do not sleep with a heating pad.  Use special stockings or bandages around the area of the affected vein as told by your doctor. These will speed healing and keep the condition from coming back.  Talk to your doctor about all the medicines you take.  Get follow-up blood tests as told by your doctor.  If the phlebitis is in your legs:  Avoid standing or resting for long periods.  Keep your legs moving. Raise your legs when you sit or lie.  Do not smoke.  Follow-up with your doctor as told. GET HELP IF:  You have strange bruises or bleeding.  Your puffiness or pain in the affected area is not getting better.  You are taking medicine to lessen puffiness (anti-inflammatory medicine), and you get belly pain.  You have a fever. GET HELP RIGHT AWAY IF:   The phlebitis gets worse and you have more pain, puffiness (swelling), or redness.  You have trouble breathing or have chest pain. MAKE SURE YOU:   Understand these instructions.  Will watch your condition.  Will get help right away if you are not doing well or get worse. Document Released: 10/06/2009 Document Revised: 10/23/2013 Document Reviewed: 06/25/2013 [facility] Patient Information 2015 [place], [place]. This information is not intended to replace advice given to you by your health care provider.  Make sure you discuss any questions you have with your health care provider.

## 2015-01-30 NOTE — Progress Notes (Signed)
Patient ID: [name], male   DOB: 01/06/1946, 69 y.o.   MRN: [id number]    Subjective: CC: right hand swelling HPI: Patient is a 69 y.o. male presenting to clinic today for same day appt. Concerns today include:  Patient reports that he was hospitalized about 3 weeks ago.  He reports swelling on the dorsal aspect of his hand since discharge from hospital.  He reports that he became concerned today because he noticed a hardening under the skin which he showed his eye doctor.  His optometrist suggested that he be evaluated.   Patient is concerned, "that I have a needle/catheter still stuck in my hand ". Patient denies fevers, chills, nausea, vomiting. He endorses some mild pain with palpation and flexion of wrist.  ROS: All other systems reviewed and are negative.  Objective: Office vital signs reviewed. BP 179/73 mmHg  Pulse 61  Temp(Src) 98.1 F (36.7 C) (Oral)  Ht 6\' 4"  (1.93 m)  Wt 325 lb 8 oz (147.646 kg)  BMI 39.64 kg/m2  Physical Examination:  General: Awake, alert, obese male, NAD, accompanied to appt by wife HEENT: Normal, EOMI Extremities: WWP, mild swelling on the dorsal aspect of the Right hand particularly between the PIP joints of the 2nd and 3rd digits.  Minimal erythema, no streaking, no TTP to joints or tendons.  Skin warm but not hot.  Palpable "rod like" lesion consistent with a phlebitis.  No edema or redness in R the wrist, elbow or shoulder areas. MSK: Normal gait and station  Assessment: 69 y.o. male superficial thrombophlebitis   Plan: See Problem List and After Visit Summary   Janora Norlander, DO PGY-1, [place]

## 2015-01-31 ENCOUNTER — Other Ambulatory Visit: Payer: Self-pay | Admitting: *Deleted

## 2015-01-31 MED ORDER — METOPROLOL TARTRATE 100 MG PO TABS: 100.0000 mg | ORAL_TABLET | Freq: Two times a day (BID) | ORAL | Status: DC

## 2015-01-31 NOTE — Progress Notes (Signed)
I was the preceptor for this visit. 

## 2015-02-18 ENCOUNTER — Ambulatory Visit (INDEPENDENT_AMBULATORY_CARE_PROVIDER_SITE_OTHER): Payer: Medicare Other | Admitting: Family Medicine

## 2015-02-18 ENCOUNTER — Encounter: Payer: Self-pay | Admitting: Family Medicine

## 2015-02-18 VITALS — BP 145/73 | HR 65 | Temp 98.3°F | Ht 76.0 in | Wt 327.0 lb

## 2015-02-18 DIAGNOSIS — N183 Chronic kidney disease, stage 3 unspecified: Secondary | ICD-10-CM

## 2015-02-18 DIAGNOSIS — IMO0002 Reserved for concepts with insufficient information to code with codable children: Secondary | ICD-10-CM

## 2015-02-18 DIAGNOSIS — I10 Essential (primary) hypertension: Secondary | ICD-10-CM

## 2015-02-18 DIAGNOSIS — M545 Low back pain, unspecified: Secondary | ICD-10-CM

## 2015-02-18 DIAGNOSIS — G479 Sleep disorder, unspecified: Secondary | ICD-10-CM | POA: Diagnosis not present

## 2015-02-18 DIAGNOSIS — E1165 Type 2 diabetes mellitus with hyperglycemia: Secondary | ICD-10-CM

## 2015-02-18 DIAGNOSIS — I809 Phlebitis and thrombophlebitis of unspecified site: Secondary | ICD-10-CM

## 2015-02-18 MED ORDER — TRAZODONE HCL 100 MG PO TABS: 100.0000 mg | ORAL_TABLET | Freq: Every evening | ORAL | Status: DC | PRN

## 2015-02-18 MED ORDER — TRAMADOL HCL 50 MG PO TABS: 50.0000 mg | ORAL_TABLET | Freq: Three times a day (TID) | ORAL | Status: DC | PRN

## 2015-02-18 MED ORDER — LIDOCAINE 5 % EX PTCH: 1.0000 | MEDICATED_PATCH | CUTANEOUS | Status: DC

## 2015-02-18 NOTE — Assessment & Plan Note (Signed)
Creatinine stable Avoiding NSAIDs, also given his age Chronic back pain, somewhat helped by tramadol Considering limitations of NSAIDs the Lidoderm is a very good option and I have prescribed this today.

## 2015-02-18 NOTE — Progress Notes (Signed)
Patient ID: Adam [name], male   DOB: March 01, 1946, 69 y.o.   MRN: [id number]   HPI  Patient presents today for follow-up  Diabetes 14 day average blood sugars 125, not requiring blood sugars so that I can review them today Taking 60-90 units of Lantus twice daily, taking 5-10 units of NovoLog 2-3 times per week for high carb meals. No low blood sugars No foot problems, but continued neuropathy.  Hypertension No chest pain, dyspnea, palpitations, headache, or leg edema Taking meds everyday  Insomnia, improved with trazodone, requests refill Back pain, no red flags including no saddle anesthesia, leg weakness, or bowel or bladder dysfunction. Has tried Lidoderm which helps him very much and he requests this medicine today. He gets some relief with tramadol but incompletely  Past medical history: Smoking status noted -  ROS: Per HPI  Objective: BP 145/73 mmHg  Pulse 65  Temp(Src) 98.3 F (36.8 C) (Oral)  Ht 6\' 4"  (1.93 m)  Wt 327 lb (148.326 kg)  BMI 39.82 kg/m2 Gen: NAD, alert, cooperative with exam HEENT: NCAT CV: RRR, good S1/S2, no murmur Resp: CTABL, no wheezes, non-labored Ext: No edema, warm Neuro: Alert and oriented, No gross deficits  Assessment and plan:  HYPERTENSION, BENIGN SYSTEMIC Reasonable control today, not quite at goal but making very positive lifestyle changes Continue lisinopril, metoprolol, torsemide Follow-up 3 months    Chronic kidney disease (CKD), stage III (moderate) Creatinine stable Avoiding NSAIDs, also given his age Chronic back pain, somewhat helped by tramadol Considering limitations of NSAIDs the Lidoderm is a very good option and I have prescribed this today.   BACK PAIN, LUMBAR Tried a friend's Lidoderm and had good effect Prescribed today - Lidoderm No NSAIDs due to age and renal disease, incompletely controlled with tramadol, avoiding narcotics    Trouble in sleeping Approved with trazodone, refilled today 100 mg  nightly   Superficial thrombophlebitis Resolved   Diabetes mellitus out of control These making aggressive lifestyle changes and it sounds like his control is improving with good average blood sugars reported However I did discuss with him at length trying to standardize his dose of Lantus and NovoLog so that we can titrated appropriately and so that he keeps his success he is getting. Lantus 80 units twice daily, NovoLog 10 units every meal Discussed and recommended endocrinology referral, patient and his wife are thinking about it.      Meds ordered this encounter  Medications  . lidocaine (LIDODERM) 5 %    Sig: Place 1 patch onto the skin daily. Remove & Discard patch within 12 hours or as directed by MD    Dispense:  30 patch    Refill:  0  . traZODone (DESYREL) 100 MG tablet    Sig: Take 1 tablet (100 mg total) by mouth at bedtime as needed for sleep.    Dispense:  90 tablet    Refill:  3  . traMADol (ULTRAM) 50 MG tablet    Sig: Take 1 tablet (50 mg total) by mouth every 8 (eight) hours as needed.    Dispense:  90 tablet    Refill:  3

## 2015-02-18 NOTE — Assessment & Plan Note (Signed)
These making aggressive lifestyle changes and it sounds like his control is improving with good average blood sugars reported However I did discuss with him at length trying to standardize his dose of Lantus and NovoLog so that we can titrated appropriately and so that he keeps his success he is getting. Lantus 80 units twice daily, NovoLog 10 units every meal Discussed and recommended endocrinology referral, patient and his wife are thinking about it.

## 2015-02-18 NOTE — Patient Instructions (Signed)
Change your insulin to:  Lantus 80 units twice daily Plus novolog 10 units every meal  PLease get eye doctor and colonoscopy results sent to [place]  Come back in 3 months

## 2015-02-18 NOTE — Assessment & Plan Note (Signed)
Reasonable control today, not quite at goal but making very positive lifestyle changes Continue lisinopril, metoprolol, torsemide Follow-up 3 months

## 2015-02-18 NOTE — Assessment & Plan Note (Signed)
Tried a friend's Lidoderm and had good effect Prescribed today - Lidoderm No NSAIDs due to age and renal disease, incompletely controlled with tramadol, avoiding narcotics

## 2015-02-18 NOTE — Assessment & Plan Note (Signed)
Approved with trazodone, refilled today 100 mg nightly

## 2015-02-18 NOTE — Assessment & Plan Note (Signed)
Resolved

## 2015-02-26 DIAGNOSIS — H25813 Combined forms of age-related cataract, bilateral: Secondary | ICD-10-CM | POA: Diagnosis not present

## 2015-02-26 DIAGNOSIS — B0052 Herpesviral keratitis: Secondary | ICD-10-CM | POA: Diagnosis not present

## 2015-02-26 DIAGNOSIS — E119 Type 2 diabetes mellitus without complications: Secondary | ICD-10-CM | POA: Diagnosis not present

## 2015-03-05 ENCOUNTER — Other Ambulatory Visit (HOSPITAL_COMMUNITY): Payer: Self-pay | Admitting: Optometry

## 2015-03-05 DIAGNOSIS — L918 Other hypertrophic disorders of the skin: Secondary | ICD-10-CM | POA: Diagnosis not present

## 2015-03-05 DIAGNOSIS — Q829 Congenital malformation of skin, unspecified: Secondary | ICD-10-CM | POA: Diagnosis not present

## 2015-03-05 DIAGNOSIS — L720 Epidermal cyst: Secondary | ICD-10-CM | POA: Diagnosis not present

## 2015-03-24 ENCOUNTER — Other Ambulatory Visit: Payer: Self-pay | Admitting: *Deleted

## 2015-03-24 MED ORDER — ESOMEPRAZOLE MAGNESIUM 40 MG PO CPDR: 40.0000 mg | DELAYED_RELEASE_CAPSULE | Freq: Every day | ORAL | Status: DC

## 2015-04-02 DIAGNOSIS — E291 Testicular hypofunction: Secondary | ICD-10-CM | POA: Diagnosis not present

## 2015-04-02 DIAGNOSIS — Z8546 Personal history of malignant neoplasm of prostate: Secondary | ICD-10-CM | POA: Diagnosis not present

## 2015-04-09 DIAGNOSIS — Z8546 Personal history of malignant neoplasm of prostate: Secondary | ICD-10-CM | POA: Diagnosis not present

## 2015-04-09 DIAGNOSIS — N5201 Erectile dysfunction due to arterial insufficiency: Secondary | ICD-10-CM | POA: Diagnosis not present

## 2015-04-09 DIAGNOSIS — E291 Testicular hypofunction: Secondary | ICD-10-CM | POA: Diagnosis not present

## 2015-04-16 DIAGNOSIS — G4733 Obstructive sleep apnea (adult) (pediatric): Secondary | ICD-10-CM | POA: Diagnosis not present

## 2015-04-27 DIAGNOSIS — G4733 Obstructive sleep apnea (adult) (pediatric): Secondary | ICD-10-CM | POA: Diagnosis not present

## 2015-05-12 ENCOUNTER — Other Ambulatory Visit: Payer: Self-pay | Admitting: *Deleted

## 2015-05-12 MED ORDER — TORSEMIDE 20 MG PO TABS: 10.0000 mg | ORAL_TABLET | Freq: Every day | ORAL | Status: DC

## 2015-05-19 ENCOUNTER — Other Ambulatory Visit: Payer: Self-pay | Admitting: *Deleted

## 2015-05-19 DIAGNOSIS — IMO0002 Reserved for concepts with insufficient information to code with codable children: Secondary | ICD-10-CM

## 2015-05-19 DIAGNOSIS — E1165 Type 2 diabetes mellitus with hyperglycemia: Secondary | ICD-10-CM

## 2015-05-19 MED ORDER — INSULIN GLARGINE 100 UNIT/ML ~~LOC~~ SOLN: 95.0000 [IU] | Freq: Two times a day (BID) | SUBCUTANEOUS | Status: DC

## 2015-05-19 MED ORDER — "INSULIN SYRINGE 30G X 1/2"" 0.5 ML MISC": 1.0000 | Status: DC | PRN

## 2015-05-19 MED ORDER — INSULIN PEN NEEDLE 31G X 8 MM MISC: 1.0000 | Freq: Every day | Status: DC

## 2015-05-29 ENCOUNTER — Other Ambulatory Visit: Payer: Self-pay | Admitting: Family Medicine

## 2015-05-29 DIAGNOSIS — IMO0002 Reserved for concepts with insufficient information to code with codable children: Secondary | ICD-10-CM

## 2015-05-29 DIAGNOSIS — E1165 Type 2 diabetes mellitus with hyperglycemia: Secondary | ICD-10-CM

## 2015-05-29 MED ORDER — "INSULIN SYRINGE 30G X 1/2"" 0.5 ML MISC": 1.0000 | Status: DC | PRN

## 2015-05-29 NOTE — Telephone Encounter (Signed)
Patient called, states pharmacy didn't have rx for syringes, rx re-sent.

## 2015-06-02 ENCOUNTER — Telehealth: Payer: Self-pay | Admitting: Family Medicine

## 2015-06-02 NOTE — Telephone Encounter (Signed)
Pt called because we are calling in the wrong needles/syringes. He needs the 5.16 called in. jw

## 2015-06-06 NOTE — Telephone Encounter (Signed)
Pt informed. Deseree Blount, CMA  

## 2015-06-06 NOTE — Telephone Encounter (Signed)
Correction made

## 2015-06-11 ENCOUNTER — Ambulatory Visit (INDEPENDENT_AMBULATORY_CARE_PROVIDER_SITE_OTHER): Payer: Medicare Other | Admitting: Family Medicine

## 2015-06-11 ENCOUNTER — Encounter: Payer: Self-pay | Admitting: Family Medicine

## 2015-06-11 VITALS — BP 166/66 | HR 61 | Temp 98.0°F | Ht 76.0 in | Wt 326.2 lb

## 2015-06-11 DIAGNOSIS — E1165 Type 2 diabetes mellitus with hyperglycemia: Secondary | ICD-10-CM | POA: Diagnosis not present

## 2015-06-11 DIAGNOSIS — E119 Type 2 diabetes mellitus without complications: Secondary | ICD-10-CM

## 2015-06-11 DIAGNOSIS — IMO0002 Reserved for concepts with insufficient information to code with codable children: Secondary | ICD-10-CM

## 2015-06-11 LAB — POCT GLYCOSYLATED HEMOGLOBIN (HGB A1C): Hemoglobin A1C: 8.1

## 2015-06-11 NOTE — Progress Notes (Signed)
   HPI  CC: diabetes check Patient states he known he can do better w/ his diet. He reports eating a lot of fried food and carbs. He has been trying to cut back over the past few months but can do better. No other issues at this time.  Medication Compliance: yes Medication Side Effects: none Blood Sugar Ranges: 100s-200s Symptoms of Hypoglycemia: none  Counseling  Diet pattern: fried and carb load foods >> been trying to reduce this over the past couple months  Exercise: minimal 2/2 knee pain    Comorbid Symptoms: no Chest pain; some SOB (typical); no Neuropathy: no Vision problems  Health Maintenance Due  Topic Date Due  . Hepatitis C Screening  1946-01-03  . COLONOSCOPY  04/27/1997  . URINE MICROALBUMIN  02/24/2013  . FOOT EXAM  03/01/2013  . OPHTHALMOLOGY EXAM  09/04/2013  . INFLUENZA VACCINE  06/02/2015   Preventative Health Care  Vaccinations reveiwed: yes  On Aspirin-yes  On statin-yes   ROS- Denies Polyuria,Polydipsia, nocturia, Vision changes, feet or hand numbness/pain/tingling. Denies  Hypoglycemia symptoms (shaky, sweaty, hungry, weak anxious, tremor, palpitations, confusion, behavior change).   Hemoglobin a1c:  Lab Results  Component Value Date   HGBA1C 8.1 06/11/2015   HGBA1C 9.4* 01/07/2015   HGBA1C 8.7 07/24/2014    Past medical history and social history reviewed and updated in the EMR as appropriate.  Objective: BP 166/66 mmHg  Pulse 61  Temp(Src) 98 F (36.7 C) (Oral)  Ht 6\' 4"  (1.93 m)  Wt 326 lb 3.2 oz (147.963 kg)  BMI 39.72 kg/m2 Gen: NAD, alert, cooperative, and pleasant. obese HEENT: NCAT, EOMI, PERRL CV: RRR, no murmur Resp: CTAB, no wheezes, non-labored Ext: No edema, warm, pulses present bilaterally Neuro: Alert and oriented, Speech clear, No gross deficits  Assessment and plan:  Diabetes mellitus out of control Patient with improved A1c today. He states he's been trying to reduce the amount of fried and carb heavy foods  recently. Very little exercise 2/2 knee pain.  - We discussed heavy eating habits and how to try to make a heavier diet more manageable. - Patient is on an extremely high dose of Lantus. With his A1c trending down I feel more confident that control may be obtained in the future, but I would still like him to see Dr. Valentina Lucks for pharmacologic management due to the dosing quantities. - f/u w/ me in 3 months    Orders Placed This Encounter  Procedures  . HgB A1c    Elberta Leatherwood, MD,MS,  PGY2 06/11/2015 11:51 AM

## 2015-06-11 NOTE — Assessment & Plan Note (Signed)
Patient with improved A1c today. He states he's been trying to reduce the amount of fried and carb heavy foods recently. Very little exercise 2/2 knee pain.  - We discussed heavy eating habits and how to try to make a heavier diet more manageable. - Patient is on an extremely high dose of Lantus. With his A1c trending down I feel more confident that control may be obtained in the future, but I would still like him to see Dr. Valentina Lucks for pharmacologic management due to the dosing quantities. - f/u w/ me in 3 months

## 2015-06-11 NOTE — Patient Instructions (Signed)
It was a pleasure seeing you today in our clinic. Today we discussed your diabetes. Here is the treatment plan we have discussed and agreed upon together:   - Working on your diet is going to be the most important project for you and your wife over the next few months. Try to cut fried food out completely. Try to push vegetables into your diet as much as possible. This will not be an easy task; but stay stubborn and try to turn this into a habit! -Please schedule an appointment with Dr. Valentina Lucks

## 2015-06-26 ENCOUNTER — Ambulatory Visit: Payer: Medicare Other | Admitting: Pharmacist

## 2015-06-30 ENCOUNTER — Other Ambulatory Visit: Payer: Self-pay | Admitting: *Deleted

## 2015-06-30 MED ORDER — MOMETASONE FUROATE 50 MCG/ACT NA SUSP
2.0000 | Freq: Every day | NASAL | Status: DC
Start: 2015-06-30 — End: 2016-07-08

## 2015-07-28 ENCOUNTER — Other Ambulatory Visit: Payer: Self-pay | Admitting: Family Medicine

## 2015-07-28 ENCOUNTER — Telehealth: Payer: Self-pay | Admitting: Family Medicine

## 2015-07-28 DIAGNOSIS — E1165 Type 2 diabetes mellitus with hyperglycemia: Secondary | ICD-10-CM

## 2015-07-28 DIAGNOSIS — IMO0002 Reserved for concepts with insufficient information to code with codable children: Secondary | ICD-10-CM

## 2015-07-28 MED ORDER — "INSULIN SYRINGE 30G X 1/2"" 0.5 ML MISC": 1.0000 | Status: DC | PRN

## 2015-07-28 NOTE — Telephone Encounter (Signed)
Reordered syringes

## 2015-07-28 NOTE — Telephone Encounter (Signed)
Pt called because he needs a refill on his syringes. The last order was only for 90 and he needs more than that she he test 2 times a day. jw

## 2015-08-05 DIAGNOSIS — G4733 Obstructive sleep apnea (adult) (pediatric): Secondary | ICD-10-CM | POA: Diagnosis not present

## 2015-08-29 DIAGNOSIS — H25813 Combined forms of age-related cataract, bilateral: Secondary | ICD-10-CM | POA: Diagnosis not present

## 2015-08-29 DIAGNOSIS — B0052 Herpesviral keratitis: Secondary | ICD-10-CM | POA: Diagnosis not present

## 2015-08-29 DIAGNOSIS — Q829 Congenital malformation of skin, unspecified: Secondary | ICD-10-CM | POA: Diagnosis not present

## 2015-08-29 DIAGNOSIS — E119 Type 2 diabetes mellitus without complications: Secondary | ICD-10-CM | POA: Diagnosis not present

## 2015-09-08 ENCOUNTER — Ambulatory Visit (INDEPENDENT_AMBULATORY_CARE_PROVIDER_SITE_OTHER): Payer: Medicare Other | Admitting: Family Medicine

## 2015-09-08 ENCOUNTER — Telehealth: Payer: Self-pay | Admitting: Family Medicine

## 2015-09-08 ENCOUNTER — Encounter: Payer: Self-pay | Admitting: Family Medicine

## 2015-09-08 VITALS — BP 151/67 | HR 55 | Temp 98.7°F | Ht 76.0 in | Wt 317.8 lb

## 2015-09-08 DIAGNOSIS — L732 Hidradenitis suppurativa: Secondary | ICD-10-CM | POA: Diagnosis not present

## 2015-09-08 DIAGNOSIS — L02214 Cutaneous abscess of groin: Secondary | ICD-10-CM | POA: Diagnosis not present

## 2015-09-08 MED ORDER — SULFAMETHOXAZOLE-TRIMETHOPRIM 800-160 MG PO TABS: 1.0000 | ORAL_TABLET | Freq: Two times a day (BID) | ORAL | Status: DC

## 2015-09-08 NOTE — Patient Instructions (Signed)
Take bactrim one pill twice daily for 10 days Go see the general surgery office - you may need to have these opened up today Warm compresses and keeping the area clean and dry will help  Be well, Dr. Ardelia Mems

## 2015-09-08 NOTE — Telephone Encounter (Signed)
Patient says that the wrong needles were sent for his insulin.  They were too long so he needs the correct ones sent.  He uses [facility] on [address].

## 2015-09-08 NOTE — Progress Notes (Signed)
Patient ID: Adam [name], male   DOB: 09/26/1946, 69 y.o.   MRN: [id number]   Subjective: CC:  boils HPI: This history was provided by the patient.  Patient's PMH significant for HTN, MI, CAD, PAD, DM type II, CKD, AAA, hyperlipidemia, prostate cancer, diverticulitis, diabetic neuropathy, gout and skin abscesses.  Patient is a 69 y.o. male presenting to clinic today for abscesses to groin, scrotum and right buttock.  Patient states he has had these "forever," and was evaluated by a dermatologist earlier this year.  The dermatologist prescribed Doxycycline to treat the abscesses.  Patient states he had  an allergic reaction to the Doxy and stopped taking it.  Patient has not seen the dermatologist for follow-up since March 2016.  Patient states several of the abscesses drain "all the time," and he has placed gauze in his underwear to absorb the drainage. Patient states 2 days ago he had chills and nausea, but denies vomiting and did not measure his temperature.  Patient denies chills, fever, N/V/D, cough, congestion, abdominal pain, chest pain, wheezing or shortness of breath within the past 24 hours.        Concerns today include:  1. Abscesses to groin, scrotum and buttock  Social History Reviewed: former smoker, quite date 11/1992 [facility] and [facility] updated.  Please see EMR. Health Maintenance: Influenza vaccination is due  ROS: Per HPI  Objective: Office vital signs reviewed. BP 151/67 mmHg  Pulse 55  Temp(Src) 98.7 F (37.1 C) (Oral)  Ht 6\' 4"  (1.93 m)  Wt 317 lb 12.8 oz (144.153 kg)  BMI 38.70 kg/m2  Physical Examination:  General: Pleasant, cooperative AA male. Awake, alert, obese, NAD Cardio: RRR, S1S2 heard, no murmurs appreciated, No edema, cyanosis or clubbing; +2 radial and dorsalis pedis pulses bilaterally Pulm: CTAB, no wheezes, rhonchi or rales GI: soft, NT/ND,+BS x4, no hepatomegaly, no splenomegaly Skin: Dry with no rashes.  However, several abscessed areas with  erythema and purulent drainage noted to intertriginous area beneath pannus on right side, groin bilaterally, left scrotum and left medial buttock.  Assessment/ Plan: 69 y.o. male presenting with several chronic abscesses to groin, scrotum and buttock.  Patient has previously been treated by dermatology for these areas with no resolution.  He presents today with purulent drainage from several of these areas.    The abscesses are too numerous and pervasive to effectively manage in clinic.  Referring patient to [facility] for further assessment and treatment.  Acute appointment for the patient has been scheduled for later today.  Patient and his wife affirm they will present for that appointment at [place].    1. Groin abscess - Ambulatory referral to [place], NP Student [facility]   -------------------------------------------------------------------------------------------------  Attending Note  Date of Visit: 09/08/2015   HPI:  Pt presents for a same day appointment to discuss skin abscesses in groin area.  Patient reports long history of abscesses in groin area. Had at one time been on doxycycline for prevention of recurrence but possibly developed allergic rxn requiring hospitalization, so stopped it. Abscesses are painful and have been draining.   ROS: See HPI  [place]: history of diverticulosis, prior MI, OSA, GERD, prostate cancer, diabetes  PHYSICAL EXAM: BP 151/67 mmHg  Pulse 55  Temp(Src) 98.7 F (37.1 C) (Oral)  Ht 6\' 4"  (1.93 m)  Wt 317 lb 12.8 oz (144.153 kg)  BMI 38.70 kg/m2 Gen: NAD, pleasant, cooperative, systemically well appaering HEENT: NCAT GU: multiple inflamed abscesses present  in groin area. One area in intertriginous area beneath R aspect of panus near midline is appears chronically open with some granulation tissue. Other areas around groin also inflamed, all are palpably expressible of pus and are tender to  palpation.  ASSESSMENT/PLAN:  69 yo M with recurrent skin abscesses of groin, several of which appear actively infected today. Given large number of abscesses, recommend he be evaluated in the acute care surgery clinic at [facility]. There are really too many of them for [place] to I&D today here at the [facility]. I did go ahead and prescribe Bactrim DS 1 tab twice daily for 10 days so that he has this on hand. Appointment obtained for patient at surgery office for this afternoon.  [place]. Ardelia Mems, [place]

## 2015-09-10 MED ORDER — INSULIN PEN NEEDLE 31G X 8 MM MISC: 1.0000 | Freq: Every day | Status: DC

## 2015-09-10 NOTE — Telephone Encounter (Signed)
Sending to [place] to address this Leeanne Rio, MD

## 2015-09-10 NOTE — Telephone Encounter (Signed)
Patient informed expressed understanding.

## 2015-09-10 NOTE — Telephone Encounter (Signed)
Pen needles sent in to patient's pharmacy.  Please inform the patient.  Derl Barrow, RN

## 2015-10-01 ENCOUNTER — Other Ambulatory Visit: Payer: Self-pay | Admitting: Surgery

## 2015-10-01 DIAGNOSIS — L732 Hidradenitis suppurativa: Secondary | ICD-10-CM | POA: Diagnosis not present

## 2015-10-06 DIAGNOSIS — Z8546 Personal history of malignant neoplasm of prostate: Secondary | ICD-10-CM | POA: Diagnosis not present

## 2015-10-13 DIAGNOSIS — E291 Testicular hypofunction: Secondary | ICD-10-CM | POA: Diagnosis not present

## 2015-10-13 DIAGNOSIS — Z8546 Personal history of malignant neoplasm of prostate: Secondary | ICD-10-CM | POA: Diagnosis not present

## 2015-10-29 ENCOUNTER — Encounter (HOSPITAL_BASED_OUTPATIENT_CLINIC_OR_DEPARTMENT_OTHER): Payer: Self-pay | Admitting: *Deleted

## 2015-10-29 DIAGNOSIS — E1165 Type 2 diabetes mellitus with hyperglycemia: Secondary | ICD-10-CM | POA: Diagnosis not present

## 2015-10-30 ENCOUNTER — Encounter (HOSPITAL_BASED_OUTPATIENT_CLINIC_OR_DEPARTMENT_OTHER)
Admission: RE | Admit: 2015-10-30 | Discharge: 2015-10-30 | Disposition: A | Discharge status: Home / Self Care | Payer: Medicare Other | Source: Ambulatory Visit | Attending: Surgery | Admitting: Surgery

## 2015-10-30 DIAGNOSIS — L732 Hidradenitis suppurativa: Secondary | ICD-10-CM | POA: Insufficient documentation

## 2015-10-30 DIAGNOSIS — Z01818 Encounter for other preprocedural examination: Secondary | ICD-10-CM | POA: Insufficient documentation

## 2015-10-30 LAB — BASIC METABOLIC PANEL
ANION GAP: 9 (ref 5–15)
BUN: 20 mg/dL (ref 6–20)
CHLORIDE: 103 mmol/L (ref 101–111)
CO2: 27 mmol/L (ref 22–32)
Calcium: 9.6 mg/dL (ref 8.9–10.3)
Creatinine, Ser: 1.32 mg/dL — ABNORMAL HIGH (ref 0.61–1.24)
GFR calc non Af Amer: 54 mL/min — ABNORMAL LOW (ref >60)
Glucose, Bld: 121 mg/dL — ABNORMAL HIGH (ref 65–99)
POTASSIUM: 4.5 mmol/L (ref 3.5–5.1)
SODIUM: 139 mmol/L (ref 135–145)

## 2015-11-04 NOTE — H&P (Signed)
Adam [name]  Location: [facility] Patient #: [id number] DOB: 02/07/1946 Married / Language: English / Race: American [place] or [place] Native Male   History of Present Illness  Patient words: groin abscess.  The patient is a 70 year old male who presents with a complaint of Hidradenitis. Patient presents to the office with approximately 6 month history of draining skin lesions. He has previously been seen by a dermatologist and placed on chronic doxycycline. He has not taken any antibiotics recently. He was seen by his Medicare provider and noted to have several draining lesions throughout his groin and perineum. He states that they swell up and drained and then go away. He denies any fevers. He states he did have a surgical resection of one area by Dr. March Rummage many years ago.   Other Problems Diabetes Mellitus High blood pressure Prostate Cancer Sleep Apnea  Past Surgical History  Knee Surgery Bilateral. Resection of Stomach  Allergies  Nitroglycerin *ANTIANGINAL AGENTS* Doxycycline *DERMATOLOGICALS*  Medication History  TraMADol HCl (50MG  Tablet, Oral) Active. Crestor (40MG  Tablet, Oral) Active. Esomeprazole Magnesium (40MG  Capsule DR, Oral) Active. HumaLOG KwikPen (100UNIT/ML Soln Pen-inj, Subcutaneous) Active. Lantus (100UNIT/ML Solution, Subcutaneous) Active. Lisinopril (20MG  Tablet, Oral) Active. Metoprolol Tartrate (100MG  Tablet, Oral) Active. MetFORMIN HCl (500MG  Tablet, Oral) Active. Mometasone Furoate (50MCG/ACT Suspension, Nasal) Active. OneTouch Ultra Blue (In Vitro) Active. Torsemide (20MG  Tablet, Oral) Active. TraZODone HCl (100MG  Tablet, Oral) Active. BD Insulin Syringe Ultrafine (31G X 15/64"0.5 ML Misc,) Active. BD Insulin Syr Ultrafine II (31G X 5/16"1 ML Misc,) Active. BD Pen Needle Mini U/F (31G X 5 MM Misc,) Active. Sulfamethoxazole-Trimethoprim (800-160MG  Tablet, Oral) Active. Acyclovir (400MG  Tablet,  Oral) Active. Medications Reconciled  Social History  Alcohol use Remotely quit alcohol use. Caffeine use Coffee. Tobacco use Former smoker.  Family History  Hypertension Mother, Sister. Prostate Cancer Father.    Review of Systems  Cardiovascular Not Present- Chest Pain, Difficulty Breathing Lying Down, Leg Cramps, Palpitations, Rapid Heart Rate, Shortness of Breath and Swelling of Extremities. Musculoskeletal Present- Back Pain. Not Present- Joint Pain, Joint Stiffness, Muscle Pain, Muscle Weakness and Swelling of Extremities. Neurological Not Present- Decreased Memory, Fainting, Headaches, Numbness, Seizures, Tingling, Tremor, Trouble walking and Weakness. Hematology Present- Persistent Infections. Not Present- Easy Bruising, Excessive bleeding, Gland problems and HIV.  Vitals   Weight: 319.6 lb Height: 76in Body Surface Area: 2.7 m Body Mass Index: 38.9 kg/m  Temp.: 83F(Temporal)  Pulse: 88 (Regular)  BP: 160/94 (Sitting, Left Arm, Standard)    Physical Exam  General Mental Status-Alert. General Appearance-Not in acute distress. Build & Nutrition-Well nourished. Posture-Normal posture. Gait-Normal.  Integumentary Problem #1 Location - Groin, Buttocks, Genitalia - Note: Approximately 3 cm lesion draining purulence in his right groin with no fluctuance, 3 small punctate lesions within this complex. 5 mm lesions with small amount of clear fluid draining in the left groin and right scrotal area and buttock.  Head and Neck Head-normocephalic, atraumatic with no lesions or palpable masses. Trachea-midline.  Chest and Lung Exam Chest and lung exam reveals -on auscultation, normal breath sounds, no adventitious sounds and normal vocal resonance.  Cardiovascular Cardiovascular examination reveals -normal heart sounds, regular rate and rhythm with no murmurs.  Abdomen Inspection Inspection of the abdomen reveals - No  Hernias. Palpation/Percussion Palpation and Percussion of the abdomen reveal - Soft, Non Tender, No Rigidity (guarding), No hepatosplenomegaly and No Palpable abdominal masses.  Neurologic Neurologic evaluation reveals -alert and oriented x 3 with no impairment of recent or remote memory, normal attention  span and ability to concentrate, normal sensation and normal coordination.  Musculoskeletal Normal Exam - Bilateral-Upper Extremity Strength Normal and Lower Extremity Strength Normal.    Assessment & Plan  HIDRADENITIS (L73.2)  Impression: 70 year old male with chronic hidradenitis. He presents to the office with approximately 6 month history of draining wounds throughout his groin and perineal area. He has been on antibiotics for episodes in the past. He did try a 3 month course of doxycycline in the past with no resolution of his symptoms. He has drainage on a daily basis. He denies any fevers. He has diabetes and his hgb A1c was 8.1 2 months ago. He has not been on antibiotics recently. I recommended that we do a 3 week course of Bactrim to start out. We discussed the need to keep his blood sugar under better control as this will also help with healing.  I discussed the diagnosis with him again in detail. He is well aware of the fact this is not a curable disease and surgery is to control the chronic draining sinus areas. I believe it is reasonable to widely excise both his inguinal areas, his right scrotum , and right inner thigh. I discussed his risk of surgery with him in detail and he wishes to proceed

## 2015-11-05 ENCOUNTER — Ambulatory Visit (HOSPITAL_BASED_OUTPATIENT_CLINIC_OR_DEPARTMENT_OTHER): Payer: Medicare Other | Admitting: Certified Registered"

## 2015-11-05 ENCOUNTER — Ambulatory Visit (HOSPITAL_BASED_OUTPATIENT_CLINIC_OR_DEPARTMENT_OTHER)
Admission: RE | Admit: 2015-11-05 | Discharge: 2015-11-05 | Disposition: A | Discharge status: Home / Self Care | Payer: Medicare Other | Source: Ambulatory Visit | Attending: Surgery | Admitting: Surgery

## 2015-11-05 ENCOUNTER — Encounter (HOSPITAL_BASED_OUTPATIENT_CLINIC_OR_DEPARTMENT_OTHER)
Admission: RE | Disposition: A | Discharge status: Home / Self Care | Payer: Self-pay | Source: Ambulatory Visit | Attending: Surgery

## 2015-11-05 ENCOUNTER — Encounter (HOSPITAL_BASED_OUTPATIENT_CLINIC_OR_DEPARTMENT_OTHER): Payer: Self-pay | Admitting: Certified Registered"

## 2015-11-05 DIAGNOSIS — Z6839 Body mass index (BMI) 39.0-39.9, adult: Secondary | ICD-10-CM | POA: Insufficient documentation

## 2015-11-05 DIAGNOSIS — I10 Essential (primary) hypertension: Secondary | ICD-10-CM | POA: Diagnosis not present

## 2015-11-05 DIAGNOSIS — Z87891 Personal history of nicotine dependence: Secondary | ICD-10-CM | POA: Insufficient documentation

## 2015-11-05 DIAGNOSIS — I129 Hypertensive chronic kidney disease with stage 1 through stage 4 chronic kidney disease, or unspecified chronic kidney disease: Secondary | ICD-10-CM | POA: Diagnosis not present

## 2015-11-05 DIAGNOSIS — E669 Obesity, unspecified: Secondary | ICD-10-CM | POA: Insufficient documentation

## 2015-11-05 DIAGNOSIS — Z8546 Personal history of malignant neoplasm of prostate: Secondary | ICD-10-CM | POA: Diagnosis not present

## 2015-11-05 DIAGNOSIS — Z794 Long term (current) use of insulin: Secondary | ICD-10-CM | POA: Insufficient documentation

## 2015-11-05 DIAGNOSIS — E1140 Type 2 diabetes mellitus with diabetic neuropathy, unspecified: Secondary | ICD-10-CM | POA: Insufficient documentation

## 2015-11-05 DIAGNOSIS — I2510 Atherosclerotic heart disease of native coronary artery without angina pectoris: Secondary | ICD-10-CM | POA: Diagnosis not present

## 2015-11-05 DIAGNOSIS — L732 Hidradenitis suppurativa: Secondary | ICD-10-CM | POA: Diagnosis not present

## 2015-11-05 DIAGNOSIS — I252 Old myocardial infarction: Secondary | ICD-10-CM | POA: Diagnosis not present

## 2015-11-05 DIAGNOSIS — N189 Chronic kidney disease, unspecified: Secondary | ICD-10-CM | POA: Diagnosis not present

## 2015-11-05 HISTORY — PX: HYDRADENITIS EXCISION: SHX5243

## 2015-11-05 HISTORY — DX: Hidradenitis suppurativa: L73.2

## 2015-11-05 LAB — GLUCOSE, CAPILLARY
Glucose-Capillary: 143 mg/dL — ABNORMAL HIGH (ref 65–99)
Glucose-Capillary: 141 mg/dL — ABNORMAL HIGH (ref 65–99)

## 2015-11-05 SURGERY — EXCISION, HIDRADENITIS, INGUINAL REGION
Anesthesia: General | Site: Groin | Laterality: Bilateral

## 2015-11-05 MED ORDER — CEPHALEXIN 500 MG PO CAPS: 500.0000 mg | ORAL_CAPSULE | Freq: Three times a day (TID) | ORAL | Status: DC

## 2015-11-05 MED ORDER — BUPIVACAINE HCL (PF) 0.5 % IJ SOLN
INTRAMUSCULAR | Status: AC
  Filled 2015-11-05: qty 30

## 2015-11-05 MED ORDER — DEXAMETHASONE SODIUM PHOSPHATE 10 MG/ML IJ SOLN
INTRAMUSCULAR | Status: AC
  Filled 2015-11-05: qty 1

## 2015-11-05 MED ORDER — ONDANSETRON HCL 4 MG/2ML IJ SOLN
INTRAMUSCULAR | Status: AC
  Filled 2015-11-05: qty 2

## 2015-11-05 MED ORDER — DEXTROSE 5 % IV SOLN
3.0000 g | INTRAVENOUS | Status: AC
  Administered 2015-11-05: 3 g via INTRAVENOUS

## 2015-11-05 MED ORDER — MIDAZOLAM HCL 2 MG/2ML IJ SOLN
INTRAMUSCULAR | Status: AC
  Filled 2015-11-05: qty 2

## 2015-11-05 MED ORDER — SODIUM CHLORIDE 0.9 % IJ SOLN: 3.0000 mL | INTRAMUSCULAR | Status: DC | PRN

## 2015-11-05 MED ORDER — FENTANYL CITRATE (PF) 100 MCG/2ML IJ SOLN
INTRAMUSCULAR | Status: AC
  Filled 2015-11-05: qty 2

## 2015-11-05 MED ORDER — FENTANYL CITRATE (PF) 100 MCG/2ML IJ SOLN
25.0000 ug | INTRAMUSCULAR | Status: DC | PRN
  Administered 2015-11-05 (×2): 50 ug via INTRAVENOUS

## 2015-11-05 MED ORDER — CEFAZOLIN SODIUM-DEXTROSE 2-3 GM-% IV SOLR
INTRAVENOUS | Status: AC
  Filled 2015-11-05: qty 100

## 2015-11-05 MED ORDER — BUPIVACAINE-EPINEPHRINE (PF) 0.5% -1:200000 IJ SOLN
INTRAMUSCULAR | Status: AC
  Filled 2015-11-05: qty 60

## 2015-11-05 MED ORDER — ATROPINE SULFATE 0.4 MG/ML IJ SOLN
INTRAMUSCULAR | Status: AC
  Filled 2015-11-05: qty 1

## 2015-11-05 MED ORDER — SUCCINYLCHOLINE CHLORIDE 20 MG/ML IJ SOLN
INTRAMUSCULAR | Status: AC
  Filled 2015-11-05: qty 1

## 2015-11-05 MED ORDER — ROCURONIUM BROMIDE 50 MG/5ML IV SOLN
INTRAVENOUS | Status: AC
  Filled 2015-11-05: qty 1

## 2015-11-05 MED ORDER — SCOPOLAMINE 1 MG/3DAYS TD PT72: 1.0000 | MEDICATED_PATCH | Freq: Once | TRANSDERMAL | Status: DC | PRN

## 2015-11-05 MED ORDER — ONDANSETRON HCL 4 MG/2ML IJ SOLN: 4.0000 mg | Freq: Once | INTRAMUSCULAR | Status: DC | PRN

## 2015-11-05 MED ORDER — FENTANYL CITRATE (PF) 100 MCG/2ML IJ SOLN
50.0000 ug | INTRAMUSCULAR | Status: DC | PRN
  Administered 2015-11-05: 100 ug via INTRAVENOUS

## 2015-11-05 MED ORDER — SODIUM CHLORIDE 0.9 % IJ SOLN: 3.0000 mL | Freq: Two times a day (BID) | INTRAMUSCULAR | Status: DC

## 2015-11-05 MED ORDER — OXYCODONE HCL 5 MG PO TABS: 5.0000 mg | ORAL_TABLET | ORAL | Status: DC | PRN

## 2015-11-05 MED ORDER — MIDAZOLAM HCL 2 MG/2ML IJ SOLN: 1.0000 mg | INTRAMUSCULAR | Status: DC | PRN

## 2015-11-05 MED ORDER — EPINEPHRINE HCL 1 MG/ML IJ SOLN
INTRAMUSCULAR | Status: AC
  Filled 2015-11-05: qty 1

## 2015-11-05 MED ORDER — OXYCODONE-ACETAMINOPHEN 5-325 MG PO TABS: 1.0000 | ORAL_TABLET | ORAL | Status: DC | PRN

## 2015-11-05 MED ORDER — NEOSTIGMINE METHYLSULFATE 10 MG/10ML IV SOLN
INTRAVENOUS | Status: AC
  Filled 2015-11-05: qty 1

## 2015-11-05 MED ORDER — SODIUM BICARBONATE 4 % IV SOLN
INTRAVENOUS | Status: AC
  Filled 2015-11-05: qty 5

## 2015-11-05 MED ORDER — GLYCOPYRROLATE 0.2 MG/ML IJ SOLN
INTRAMUSCULAR | Status: AC
  Filled 2015-11-05: qty 1

## 2015-11-05 MED ORDER — GLYCOPYRROLATE 0.2 MG/ML IJ SOLN
0.2000 mg | Freq: Once | INTRAMUSCULAR | Status: AC | PRN
  Administered 2015-11-05: .6 mg via INTRAVENOUS

## 2015-11-05 MED ORDER — ONDANSETRON HCL 4 MG/2ML IJ SOLN
INTRAMUSCULAR | Status: DC | PRN
  Administered 2015-11-05: 4 mg via INTRAVENOUS

## 2015-11-05 MED ORDER — GLYCOPYRROLATE 0.2 MG/ML IJ SOLN
INTRAMUSCULAR | Status: AC
  Filled 2015-11-05: qty 2

## 2015-11-05 MED ORDER — SODIUM CHLORIDE 0.9 % IV SOLN: 250.0000 mL | INTRAVENOUS | Status: DC | PRN

## 2015-11-05 MED ORDER — PROPOFOL 500 MG/50ML IV EMUL
INTRAVENOUS | Status: AC
  Filled 2015-11-05: qty 50

## 2015-11-05 MED ORDER — MORPHINE SULFATE (PF) 2 MG/ML IV SOLN: 1.0000 mg | INTRAVENOUS | Status: DC | PRN

## 2015-11-05 MED ORDER — PROPOFOL 10 MG/ML IV BOLUS
INTRAVENOUS | Status: DC | PRN
  Administered 2015-11-05: 200 mg via INTRAVENOUS

## 2015-11-05 MED ORDER — BUPIVACAINE-EPINEPHRINE 0.5% -1:200000 IJ SOLN
INTRAMUSCULAR | Status: DC | PRN
  Administered 2015-11-05: 30 mL

## 2015-11-05 MED ORDER — LIDOCAINE HCL (CARDIAC) 20 MG/ML IV SOLN
INTRAVENOUS | Status: AC
  Filled 2015-11-05: qty 5

## 2015-11-05 MED ORDER — EPHEDRINE SULFATE 50 MG/ML IJ SOLN
INTRAMUSCULAR | Status: DC | PRN
  Administered 2015-11-05: 15 mg via INTRAVENOUS
  Administered 2015-11-05: 10 mg via INTRAVENOUS

## 2015-11-05 MED ORDER — LIDOCAINE HCL (PF) 1 % IJ SOLN
INTRAMUSCULAR | Status: AC
  Filled 2015-11-05: qty 60

## 2015-11-05 MED ORDER — ROCURONIUM 10MG/ML (10ML) SYRINGE FOR MEDFUSION PUMP - OPTIME
INTRAVENOUS | Status: DC | PRN
  Administered 2015-11-05: 50 mg via INTRAVENOUS

## 2015-11-05 MED ORDER — ACETAMINOPHEN 325 MG PO TABS: 650.0000 mg | ORAL_TABLET | ORAL | Status: DC | PRN

## 2015-11-05 MED ORDER — NEOSTIGMINE METHYLSULFATE 10 MG/10ML IV SOLN
INTRAVENOUS | Status: DC | PRN
  Administered 2015-11-05: 3 mg via INTRAVENOUS

## 2015-11-05 MED ORDER — ACETAMINOPHEN 650 MG RE SUPP: 650.0000 mg | RECTAL | Status: DC | PRN

## 2015-11-05 MED ORDER — LACTATED RINGERS IV SOLN
INTRAVENOUS | Status: DC
  Administered 2015-11-05 (×2): via INTRAVENOUS

## 2015-11-05 MED ORDER — LIDOCAINE HCL (CARDIAC) 20 MG/ML IV SOLN
INTRAVENOUS | Status: DC | PRN
  Administered 2015-11-05: 100 mg via INTRAVENOUS

## 2015-11-05 SURGICAL SUPPLY — 42 items
BLADE CLIPPER SURG (BLADE) ×2 IMPLANT
BLADE HEX COATED 2.75 (ELECTRODE) ×3 IMPLANT
BLADE SURG 15 STRL LF DISP TIS (BLADE) ×1 IMPLANT
BLADE SURG 15 STRL SS (BLADE) ×3
CANISTER SUCT 1200ML W/VALVE (MISCELLANEOUS) ×3 IMPLANT
CHLORAPREP W/TINT 26ML (MISCELLANEOUS) ×3 IMPLANT
COVER BACK TABLE 60X90IN (DRAPES) ×3 IMPLANT
COVER MAYO STAND STRL (DRAPES) ×3 IMPLANT
DECANTER SPIKE VIAL GLASS SM (MISCELLANEOUS) IMPLANT
DRAPE LAPAROTOMY 100X72 PEDS (DRAPES) ×3 IMPLANT
DRAPE LAPAROTOMY T 102X78X121 (DRAPES) IMPLANT
DRAPE UTILITY XL STRL (DRAPES) ×3 IMPLANT
ELECT REM PT RETURN 9FT ADLT (ELECTROSURGICAL) ×3
ELECTRODE REM PT RTRN 9FT ADLT (ELECTROSURGICAL) ×1 IMPLANT
GAUZE SPONGE 4X4 12PLY STRL (GAUZE/BANDAGES/DRESSINGS) ×6 IMPLANT
GAUZE XEROFORM 5X9 LF (GAUZE/BANDAGES/DRESSINGS) ×2 IMPLANT
GLOVE BIOGEL M STRL SZ7.5 (GLOVE) ×2 IMPLANT
GLOVE BIOGEL PI IND STRL 8 (GLOVE) IMPLANT
GLOVE BIOGEL PI INDICATOR 8 (GLOVE) ×2
GLOVE EXAM NITRILE LRG STRL (GLOVE) ×2 IMPLANT
GLOVE SURG SIGNA 7.5 PF LTX (GLOVE) ×3 IMPLANT
GOWN STRL REUS W/ TWL LRG LVL3 (GOWN DISPOSABLE) ×1 IMPLANT
GOWN STRL REUS W/ TWL XL LVL3 (GOWN DISPOSABLE) ×1 IMPLANT
GOWN STRL REUS W/TWL LRG LVL3 (GOWN DISPOSABLE) ×3
GOWN STRL REUS W/TWL XL LVL3 (GOWN DISPOSABLE) ×3
LIQUID BAND (GAUZE/BANDAGES/DRESSINGS) ×3 IMPLANT
NDL HYPO 25X1 1.5 SAFETY (NEEDLE) ×1 IMPLANT
NEEDLE HYPO 25X1 1.5 SAFETY (NEEDLE) ×3 IMPLANT
NS IRRIG 1000ML POUR BTL (IV SOLUTION) ×3 IMPLANT
PACK BASIN DAY SURGERY FS (CUSTOM PROCEDURE TRAY) ×3 IMPLANT
PENCIL BUTTON HOLSTER BLD 10FT (ELECTRODE) ×3 IMPLANT
SLEEVE SCD COMPRESS KNEE MED (MISCELLANEOUS) ×2 IMPLANT
SPONGE LAP 4X18 X RAY DECT (DISPOSABLE) ×3 IMPLANT
SUT ETHILON 2 0 FS 18 (SUTURE) ×16 IMPLANT
SUT ETHILON 3 0 FSL (SUTURE) ×2 IMPLANT
SYR BULB 3OZ (MISCELLANEOUS) ×3 IMPLANT
SYR CONTROL 10ML LL (SYRINGE) ×3 IMPLANT
TOWEL OR 17X24 6PK STRL BLUE (TOWEL DISPOSABLE) ×3 IMPLANT
TOWEL OR NON WOVEN STRL DISP B (DISPOSABLE) ×3 IMPLANT
TUBE CONNECTING 20'X1/4 (TUBING) ×1
TUBE CONNECTING 20X1/4 (TUBING) ×2 IMPLANT
YANKAUER SUCT BULB TIP NO VENT (SUCTIONS) ×3 IMPLANT

## 2015-11-05 NOTE — Anesthesia Postprocedure Evaluation (Signed)
Anesthesia Post Note  Patient: Software engineer  Procedure(s) Performed: Procedure(s) (LRB): WIDE EXCISION HIDRADENITIS BILATERAL GROINS, RIGHT SCROTUM, BILATERAL INNER THIGH   (Bilateral)  Patient location during evaluation: PACU Anesthesia Type: General Level of consciousness: awake and alert and patient cooperative Pain management: pain level controlled Vital Signs Assessment: post-procedure vital signs reviewed and stable Respiratory status: spontaneous breathing and respiratory function stable Cardiovascular status: stable Anesthetic complications: no    Last Vitals:  Filed Vitals:   11/05/15 1000 11/05/15 1025  BP: 129/68   Pulse: 64 58  Temp:  36.8 C  Resp: 14     Last Pain:  Filed Vitals:   11/05/15 1027  PainSc: 1                  Payden Docter S

## 2015-11-05 NOTE — Anesthesia Preprocedure Evaluation (Addendum)
Anesthesia Evaluation  Patient identified by MRN, date of birth, ID band Patient awake    Reviewed: Allergy & Precautions, H&P , NPO status , Patient's Chart, lab work & pertinent test results, reviewed documented beta blocker date and time   History of Anesthesia Complications Negative for: history of anesthetic complications  Airway Mallampati: III  TM Distance: <3 FB Neck ROM: Full    Dental no notable dental hx. (+) Dental Advisory Given, Upper Dentures, Missing   Pulmonary sleep apnea and Continuous Positive Airway Pressure Ventilation , former smoker,    Pulmonary exam normal breath sounds clear to auscultation       Cardiovascular Exercise Tolerance: Poor hypertension, Pt. on medications and Pt. on home beta blockers + CAD, + Past MI (2001) and + Peripheral Vascular Disease (s/p AAA repair)  Normal cardiovascular exam Rhythm:Regular Rate:Normal     Neuro/Psych negative neurological ROS  negative psych ROS   GI/Hepatic negative GI ROS, Neg liver ROS, GERD  Medicated,  Endo/Other  diabetes (diabetic neuropathy), Type 2, Insulin Dependent, Oral Hypoglycemic AgentsObesity   Renal/GU Renal InsufficiencyRenal disease  negative genitourinary   Musculoskeletal negative musculoskeletal ROS (+)   Abdominal   Peds negative pediatric ROS (+)  Hematology negative hematology ROS (+)   Anesthesia Other Findings Day of surgery medications reviewed with the patient.  Reproductive/Obstetrics                            Anesthesia Physical Anesthesia Plan  ASA: III  Anesthesia Plan: General   Post-op Pain Management:    Induction: Intravenous  Airway Management Planned: Oral ETT  Additional Equipment:   Intra-op Plan:   Post-operative Plan: Extubation in OR  Informed Consent: I have reviewed the patients History and Physical, chart, labs and discussed the procedure including the risks,  benefits and alternatives for the proposed anesthesia with the patient or authorized representative who has indicated his/her understanding and acceptance.   Dental advisory given  Plan Discussed with: CRNA  Anesthesia Plan Comments: (Risks/benefits of general anesthesia discussed with patient including risk of damage to teeth, lips, gum, and tongue, nausea/vomiting, allergic reactions to medications, and the possibility of heart attack, stroke and death.  All patient questions answered.  Patient wishes to proceed.)        Anesthesia Quick Evaluation

## 2015-11-05 NOTE — Transfer of Care (Signed)
Immediate Anesthesia Transfer of Care Note  Patient: Adam [name]  Procedure(s) Performed: Procedure(s): WIDE EXCISION HIDRADENITIS BILATERAL GROINS, RIGHT SCROTUM, BILATERAL INNER THIGH   (Bilateral)  Patient Location: PACU  Anesthesia Type:General  Level of Consciousness: awake, alert , oriented and patient cooperative  Airway & Oxygen Therapy: Patient Spontanous Breathing and Patient connected to face mask oxygen  Post-op Assessment: Report given to RN, Post -op Vital signs reviewed and stable and Patient moving all extremities  Post vital signs: Reviewed and stable  Last Vitals:  Filed Vitals:   11/05/15 0725  BP: 141/66  Pulse: 55  Temp: 37 C  Resp: 18    Complications: No apparent anesthesia complications

## 2015-11-05 NOTE — Op Note (Signed)
WIDE EXCISION HIDRADENITIS BILATERAL GROINS, RIGHT SCROTUM, BILATERAL INNER THIGH    Procedure Note  Duy Doshier 11/05/2015   Pre-op Diagnosis: hidradenitis     Post-op Diagnosis: same  Procedure(s): WIDE EXCISION HIDRADENITIS BILATERAL GROINS, RIGHT SCROTUM, BILATERAL INNER THIGH    Surgeon(s): Coralie Keens, MD  Anesthesia: General  Staff:  Circulator: Vara Guardian, RN Scrub Person: Humberto Seals, RN  Estimated Blood Loss: Minimal                         Peggye Poon A   Date: 11/05/2015  Time: 9:19 AM

## 2015-11-05 NOTE — Discharge Instructions (Signed)
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  Change daily and as needed  Expect drainage of fluid and blood   Post Anesthesia Home Care Instructions  Activity: Get plenty of rest for the remainder of the day. A responsible adult should stay with you for 24 hours following the procedure.  For the next 24 hours, DO NOT: -Drive a car -Paediatric nurse -Drink alcoholic beverages -Take any medication unless instructed by your physician -Make any legal decisions or sign important papers.  Meals: Start with liquid foods such as gelatin or soup. Progress to regular foods as tolerated. Avoid greasy, spicy, heavy foods. If nausea and/or vomiting occur, drink only clear liquids until the nausea and/or vomiting subsides. Call your physician if vomiting continues.  Special Instructions/Symptoms: Your throat may feel dry or sore from the anesthesia or the breathing tube placed in your throat during surgery. If this causes discomfort, gargle with warm salt water. The discomfort should disappear within 24 hours.  If you had a scopolamine patch placed behind your ear for the management of post- operative nausea and/or vomiting:  1. The medication in the patch is effective for 72 hours, after which it should be removed.  Wrap patch in a tissue and discard in the trash. Wash hands thoroughly with soap and water. 2. You may remove the patch earlier than 72 hours if you experience unpleasant side effects which may include dry mouth, dizziness or visual disturbances. 3. Avoid touching the patch. Wash your hands with soap and water after contact with the patch.

## 2015-11-05 NOTE — Anesthesia Procedure Notes (Signed)
Procedure Name: Intubation Date/Time: 11/05/2015 8:35 AM Performed by: Baxter Flattery Pre-anesthesia Checklist: Patient identified, Emergency Drugs available, Suction available and Patient being monitored Patient Re-evaluated:Patient Re-evaluated prior to inductionOxygen Delivery Method: Circle System Utilized Preoxygenation: Pre-oxygenation with 100% oxygen Intubation Type: IV induction Ventilation: Mask ventilation without difficulty Laryngoscope Size: Glidescope and 4 Grade View: Grade III Tube type: Oral Tube size: 8.0 mm Number of attempts: 1 Airway Equipment and Method: Stylet,  Oral airway and Video-laryngoscopy Placement Confirmation: ETT inserted through vocal cords under direct vision,  positive ETCO2 and breath sounds checked- equal and bilateral Secured at: 24 cm Tube secured with: Tape Dental Injury: Teeth and Oropharynx as per pre-operative assessment

## 2015-11-05 NOTE — Interval H&P Note (Signed)
History and Physical Interval Note: patient also has a small area of hidradenitis along the left inner thigh near the anus just like the right inner thigh so we will excise this area also. Otherwise, no change in H and P  11/05/2015 8:03 AM  Adam [name]  has presented today for surgery, with the diagnosis of hidradenitis  The various methods of treatment have been discussed with the patient and family. After consideration of risks, benefits and other options for treatment, the patient has consented to  Procedure(s): WIDE EXCISION HIDRADENITIS BILATERAL GROINS, RIGHT SCROTUM, RIGHT INNER THIGH (Bilateral) as a surgical intervention .  The patient's history has been reviewed, patient examined, no change in status, stable for surgery.  I have reviewed the patient's chart and labs.  Questions were answered to the patient's satisfaction.     Dalicia Kisner A

## 2015-11-06 ENCOUNTER — Encounter (HOSPITAL_BASED_OUTPATIENT_CLINIC_OR_DEPARTMENT_OTHER): Payer: Self-pay | Admitting: Surgery

## 2015-11-06 NOTE — Addendum Note (Signed)
Addendum  created 11/06/15 1007 by Tawni Millers, CRNA   Modules edited: Charges VN

## 2015-11-06 NOTE — Op Note (Signed)
NAME:  Adam [name], Adam [name]:  [id number]  MEDICAL RECORD NO.:  [id number]  LOCATION:                                 FACILITY:  PHYSICIAN:  Coralie Keens, M.D.      DATE OF BIRTH:  DATE OF PROCEDURE:  11/05/2015 DATE OF DISCHARGE:                              OPERATIVE REPORT   PREOPERATIVE DIAGNOSIS:  Hidradenitis.  POSTOPERATIVE DIAGNOSIS:  Hidradenitis.  PROCEDURE:  Wide excision of hidradenitis of bilateral groins, right scrotum, and bilateral inner thighs.  SURGEON:  Emidio Warrell A. Ninfa Linden, M.D.  ANESTHESIA:  General and 0.5% Marcaine with epinephrine.  ESTIMATED BLOOD LOSS:  Minimal.  FINDINGS:  The patient was found to have chronic hidradenitis in multiple areas.  I did 4 cm2 wide excisions in the bilateral groins, 3 cm in the right scrotum, and 2 cm in the bilateral inner thighs.  All this included just the skin and subcutaneous tissue.  PROCEDURE IN DETAIL:  The patient was brought to the operating room, identified as [name].  He was placed supine on the operating room table and general anesthesia was induced.  His lower abdomen, groins, and inner thighs were prepped and draped in usual sterile fashion.  I anesthetized the skin of all sites with Marcaine with epinephrine.  I then performed elliptical incisions around all the draining chronic sinus tracts.  The bilateral groins were 4 cm2 of skin and soft tissue, the right scrotum was 3 cm, and the bilateral thighs were 2 cm each of skin and subcutaneous tissue.  Once all these were excised, I achieved hemostasis with the cautery.  I anesthetized all the wounds further, and then closed all the wounds with interrupted 2-0 nylon sutures.  Gauze and tape were then applied.  The patient tolerated the procedure well.  All the counts were correct at the end of procedure.  The patient was then extubated in the operating room and taken in stable condition to recovery room.     Coralie Keens, M.D.     DB/MEDQ  D:  11/05/2015  T:  11/05/2015  Job:  [id number]

## 2015-11-17 ENCOUNTER — Encounter: Payer: Self-pay | Admitting: Family Medicine

## 2015-11-17 ENCOUNTER — Ambulatory Visit (INDEPENDENT_AMBULATORY_CARE_PROVIDER_SITE_OTHER): Payer: Medicare Other | Admitting: Family Medicine

## 2015-11-17 VITALS — BP 123/62 | HR 55 | Temp 97.5°F | Wt 316.0 lb

## 2015-11-17 DIAGNOSIS — Z0000 Encounter for general adult medical examination without abnormal findings: Secondary | ICD-10-CM

## 2015-11-17 DIAGNOSIS — Z794 Long term (current) use of insulin: Secondary | ICD-10-CM

## 2015-11-17 DIAGNOSIS — E119 Type 2 diabetes mellitus without complications: Secondary | ICD-10-CM

## 2015-11-17 DIAGNOSIS — E1165 Type 2 diabetes mellitus with hyperglycemia: Secondary | ICD-10-CM

## 2015-11-17 DIAGNOSIS — IMO0001 Reserved for inherently not codable concepts without codable children: Secondary | ICD-10-CM

## 2015-11-17 DIAGNOSIS — Z23 Encounter for immunization: Secondary | ICD-10-CM

## 2015-11-17 LAB — POCT GLYCOSYLATED HEMOGLOBIN (HGB A1C): HEMOGLOBIN A1C: 7.8

## 2015-11-17 MED ORDER — INSULIN GLARGINE 100 UNIT/ML ~~LOC~~ SOLN: 85.0000 [IU] | Freq: Two times a day (BID) | SUBCUTANEOUS | Status: DC

## 2015-11-17 NOTE — Progress Notes (Signed)
   HPI  CC: Diabetes recheck Patient is here for diabetes check he states that he is been very compliant with his medication regimen at this time. He has been attempting to diet and exercise regularly. Patient is currently down 9 pounds from our previous visit in August of last year. He reports no new symptoms at this time. No symptoms concerning for hypoglycemia.  ROS: Denies headache, blurred vision, confusion, lightheadedness, diaphoresis, chest pain, shortness of breath, nausea, vomiting, diarrhea, polydipsia, polyuria, numbness, or weakness.  Past medical history and social history reviewed and updated in the EMR as appropriate.  Objective: BP 123/62 mmHg  Pulse 55  Temp(Src) 97.5 F (36.4 C) (Oral)  Wt 316 lb (143.337 kg) Gen: NAD, alert, cooperative, and pleasant. HEENT: NCAT, EOMI, PERRL CV: RRR, no murmur Resp: CTAB, no wheezes, non-labored Abd: Severely obese, SNTND, BS present, no guarding or organomegaly Ext: No edema, warm Neuro: Alert and oriented, Speech clear, No gross deficits  Assessment and plan:  Diabetes mellitus out of control Improving: Patient is here for a diabetes check A1c today was 7.8. This is improved from 8.1 approximately 5 months ago. Patient endorses good compliance with his insulin regimen at this time. He is currently taking 85 units twice a day. We were able to discuss future goals for his weight and diabetes. Patient appears motivated to make improvements. - Continue current medication regimen. - Stressed weight loss with modification to diet and regular exercise. - Goals set: By the end of the year his A1c goal is 6.8. His weight: At same time is 280 pounds.    Orders Placed This Encounter  Procedures  . Flu Vaccine QUAD 36+ mos IM  . Pneumococcal polysaccharide vaccine 23-valent greater than or equal to 2yo subcutaneous/IM  . HgB A1c    Meds ordered this encounter  Medications  . insulin glargine (LANTUS) 100 UNIT/ML injection   Sig: Inject 0.85 mLs (85 Units total) into the skin 2 (two) times daily. QS one month supply    Dispense:  10 mL    Refill:  5    Please give QS for 1 month     Elberta Leatherwood, MD,MS,  PGY2 11/19/2015 5:33 PM

## 2015-11-17 NOTE — Patient Instructions (Addendum)
It was a pleasure seeing you today in our clinic. Today we discussed your diabetes. Here is the treatment plan we have discussed and agreed upon together:  - Today her A1c was 7.8. Congratulations on the significant improvement, continue the hard work of improving this every 3 months. - We set goals for the new year today. Keep in mind that these are just to be used his motivation and not as a endpoint.  - Your goals today were that by the end of 2017:   - You would like your A1c to be 6.8   - You would like your weight to be at most 280 lbs - I would like to see you back in our office in 3 months.

## 2015-11-18 DIAGNOSIS — G4733 Obstructive sleep apnea (adult) (pediatric): Secondary | ICD-10-CM | POA: Diagnosis not present

## 2015-11-19 NOTE — Assessment & Plan Note (Signed)
Improving: Patient is here for a diabetes check A1c today was 7.8. This is improved from 8.1 approximately 5 months ago. Patient endorses good compliance with his insulin regimen at this time. He is currently taking 85 units twice a day. We were able to discuss future goals for his weight and diabetes. Patient appears motivated to make improvements. - Continue current medication regimen. - Stressed weight loss with modification to diet and regular exercise. - Goals set: By the end of the year his A1c goal is 6.8. His weight: At same time is 280 pounds.

## 2015-12-02 ENCOUNTER — Ambulatory Visit (INDEPENDENT_AMBULATORY_CARE_PROVIDER_SITE_OTHER): Payer: Medicare Other | Admitting: Cardiovascular Disease

## 2015-12-02 ENCOUNTER — Encounter: Payer: Self-pay | Admitting: Cardiovascular Disease

## 2015-12-02 VITALS — BP 124/64 | HR 53 | Ht 76.0 in | Wt 323.0 lb

## 2015-12-02 DIAGNOSIS — I2510 Atherosclerotic heart disease of native coronary artery without angina pectoris: Secondary | ICD-10-CM | POA: Diagnosis not present

## 2015-12-02 DIAGNOSIS — I739 Peripheral vascular disease, unspecified: Secondary | ICD-10-CM

## 2015-12-02 DIAGNOSIS — I10 Essential (primary) hypertension: Secondary | ICD-10-CM

## 2015-12-02 DIAGNOSIS — E785 Hyperlipidemia, unspecified: Secondary | ICD-10-CM | POA: Diagnosis not present

## 2015-12-02 DIAGNOSIS — Z79899 Other long term (current) drug therapy: Secondary | ICD-10-CM | POA: Diagnosis not present

## 2015-12-02 LAB — HEPATIC FUNCTION PANEL
ALBUMIN: 3.7 g/dL (ref 3.6–5.1)
ALK PHOS: 66 U/L (ref 40–115)
ALT: 17 U/L (ref 9–46)
AST: 16 U/L (ref 10–35)
BILIRUBIN TOTAL: 0.5 mg/dL (ref 0.2–1.2)
Total Protein: 7.2 g/dL (ref 6.1–8.1)

## 2015-12-02 LAB — LIPID PANEL
Cholesterol: 116 mg/dL — ABNORMAL LOW (ref 125–200)
HDL: 32 mg/dL — AB (ref >=40)
LDL CALC: 66 mg/dL (ref <130)
Total CHOL/HDL Ratio: 3.6 Ratio (ref <=5.0)
Triglycerides: 88 mg/dL (ref <150)
VLDL: 18 mg/dL (ref <30)

## 2015-12-02 NOTE — Assessment & Plan Note (Signed)
Continue treatment with rosuvastatin with a target LDL of less than 70. I requested a fasting lipid and liver profile.

## 2015-12-02 NOTE — Assessment & Plan Note (Signed)
He has no anginal symptoms. Continue aggressive medical therapy.

## 2015-12-02 NOTE — Patient Instructions (Signed)
Medication Instructions:  Your physician recommends that you continue on your current medications as directed. Please refer to the Current Medication list given to you today.  Labwork: Your physician recommends that you have lab work today: LIPID and LIVER  Testing/Procedures: Your physician has requested that you have a lower extremity arterial doppler. This test is an ultrasound of the arteries in the legs. It looks at arterial blood flow in the legs. Allow one hour for Lower Arterial scans. There are no restrictions or special instructions  Follow-Up: Your physician wants you to follow-up in: 1 YEAR with Dr Fletcher Anon.  You will receive a reminder letter in the mail two months in advance. If you don't receive a letter, please call our office to schedule the follow-up appointment.   Any Other Special Instructions Will Be Listed Below (If Applicable).     If you need a refill on your cardiac medications before your next appointment, please call your pharmacy.
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
## 2015-12-02 NOTE — Assessment & Plan Note (Signed)
He continues to have mild bilateral calf claudication. Symptoms are reasonably controlled with cilostazol. No recent noninvasive evaluation and thus I requested lower extremity arterial Doppler to ensure stability.

## 2015-12-02 NOTE — Assessment & Plan Note (Signed)
Blood pressure is well controlled on current medications. 

## 2015-12-22 ENCOUNTER — Other Ambulatory Visit: Payer: Self-pay | Admitting: *Deleted

## 2015-12-22 DIAGNOSIS — I10 Essential (primary) hypertension: Secondary | ICD-10-CM

## 2015-12-24 ENCOUNTER — Other Ambulatory Visit: Payer: Self-pay | Admitting: *Deleted

## 2015-12-24 MED ORDER — LISINOPRIL 20 MG PO TABS: 20.0000 mg | ORAL_TABLET | Freq: Every day | ORAL | Status: DC

## 2015-12-25 MED ORDER — INSULIN LISPRO 100 UNIT/ML (KWIKPEN): 20.0000 [IU] | PEN_INJECTOR | Freq: Three times a day (TID) | SUBCUTANEOUS | Status: DC

## 2015-12-27 ENCOUNTER — Other Ambulatory Visit: Payer: Self-pay | Admitting: Cardiovascular Disease

## 2015-12-29 NOTE — Telephone Encounter (Signed)
Requested Prescriptions   Pending Prescriptions Disp Refills  . cilostazol (PLETAL) 100 MG tablet [Pharmacy Med Name: CILOSTAZOL 100 MG TABLET] 180 tablet 3    Sig: take 1 tablet by mouth twice a day

## 2016-01-06 ENCOUNTER — Ambulatory Visit (HOSPITAL_COMMUNITY)
Admission: RE | Admit: 2016-01-06 | Discharge: 2016-01-06 | Disposition: A | Discharge status: Home / Self Care | Payer: Medicare Other | Source: Ambulatory Visit | Attending: Cardiovascular Disease | Admitting: Cardiovascular Disease

## 2016-01-06 DIAGNOSIS — N183 Chronic kidney disease, stage 3 (moderate): Secondary | ICD-10-CM | POA: Insufficient documentation

## 2016-01-06 DIAGNOSIS — E1122 Type 2 diabetes mellitus with diabetic chronic kidney disease: Secondary | ICD-10-CM | POA: Insufficient documentation

## 2016-01-06 DIAGNOSIS — I129 Hypertensive chronic kidney disease with stage 1 through stage 4 chronic kidney disease, or unspecified chronic kidney disease: Secondary | ICD-10-CM | POA: Diagnosis not present

## 2016-01-06 DIAGNOSIS — I739 Peripheral vascular disease, unspecified: Secondary | ICD-10-CM | POA: Insufficient documentation

## 2016-01-06 DIAGNOSIS — R938 Abnormal findings on diagnostic imaging of other specified body structures: Secondary | ICD-10-CM | POA: Insufficient documentation

## 2016-01-20 ENCOUNTER — Other Ambulatory Visit: Payer: Self-pay | Admitting: *Deleted

## 2016-01-20 MED ORDER — ROSUVASTATIN CALCIUM 40 MG PO TABS: 40.0000 mg | ORAL_TABLET | Freq: Every day | ORAL | Status: DC

## 2016-01-20 MED ORDER — METOPROLOL TARTRATE 100 MG PO TABS: 100.0000 mg | ORAL_TABLET | Freq: Two times a day (BID) | ORAL | Status: DC

## 2016-01-22 IMAGING — CR DG CHEST 2V
2 series · 2 of 2 positions shown · non-contrast
Comparison: 05/16/2012

CLINICAL DATA: Shortness of breath this morning.

EXAM:
CHEST  2 VIEW

[w chest lat]
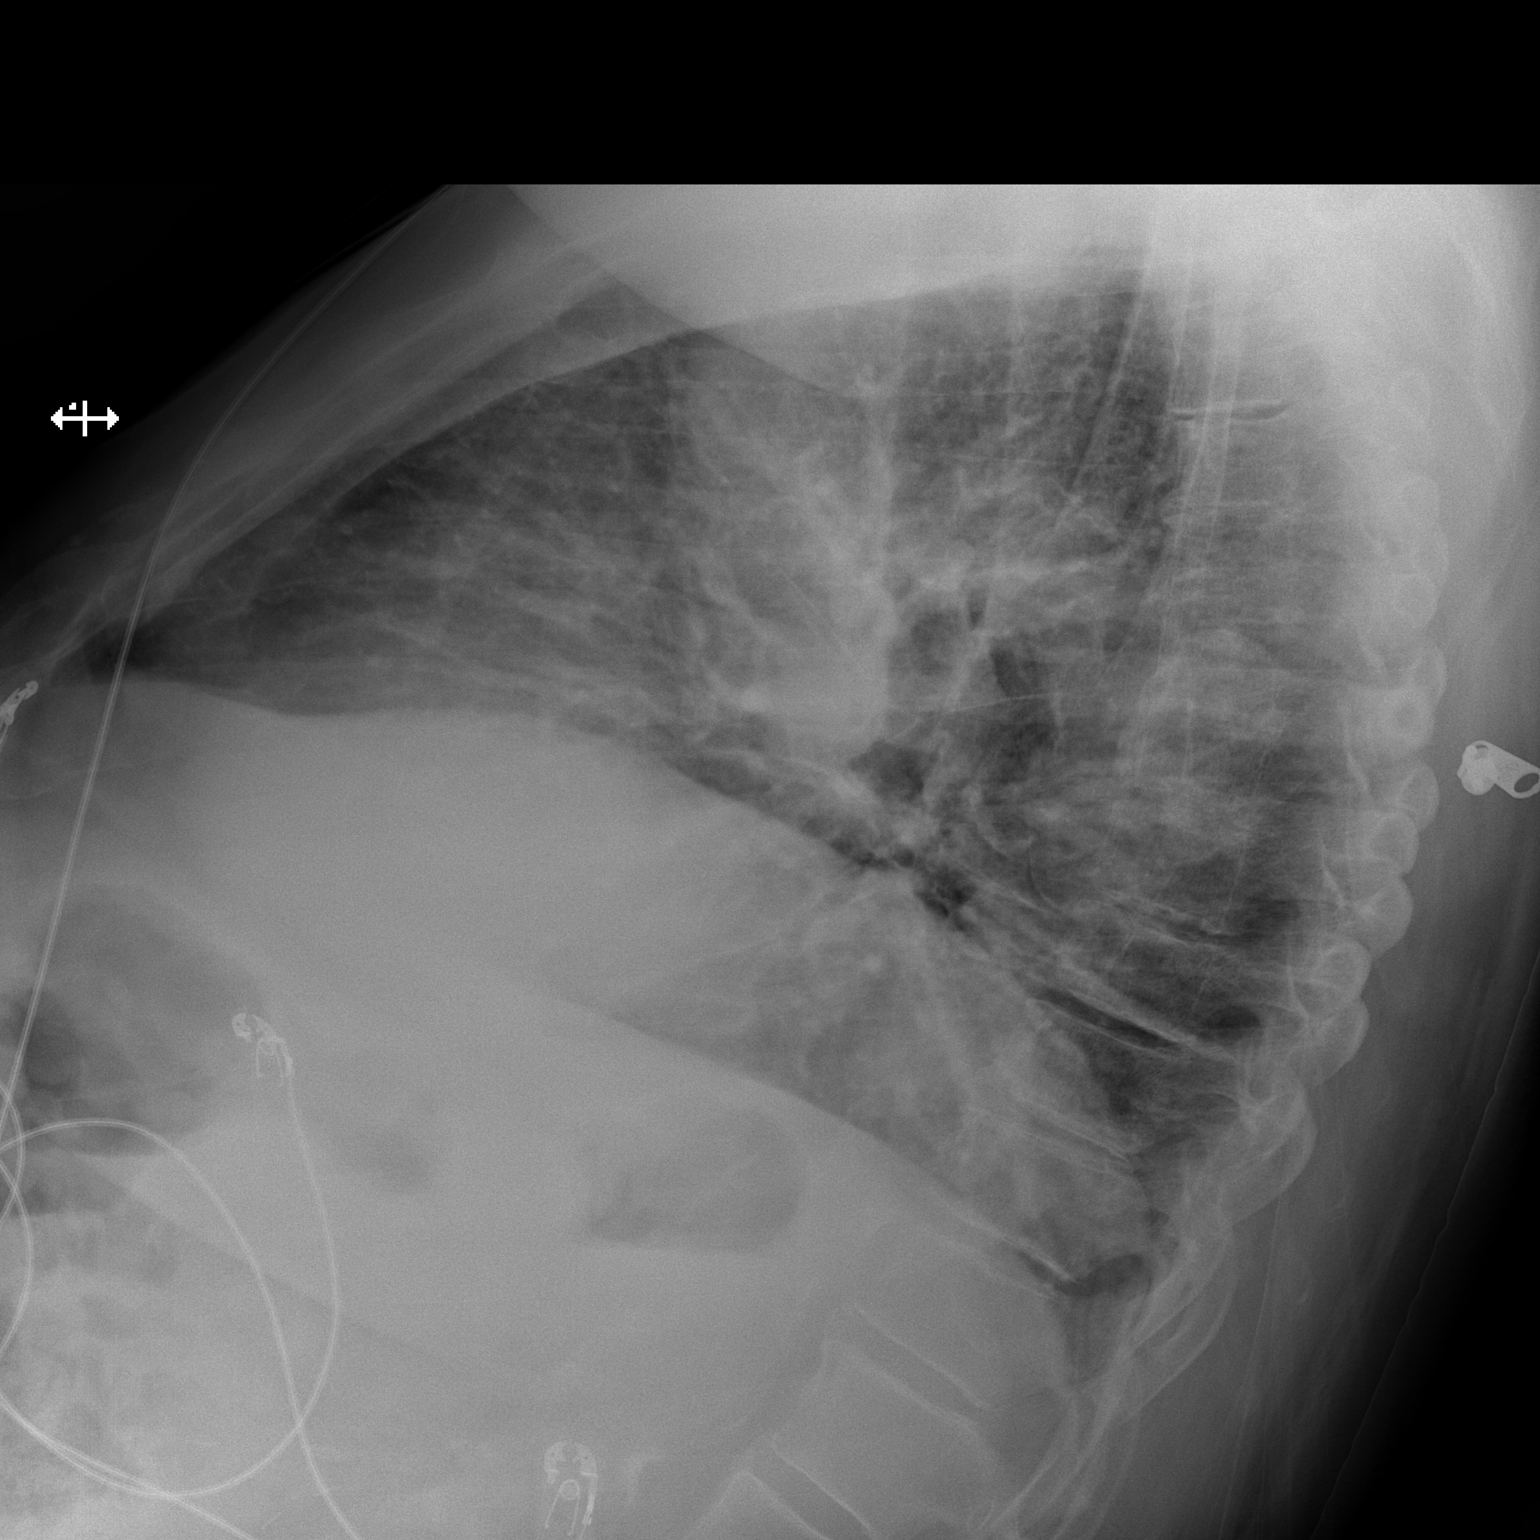

[x chest ap]
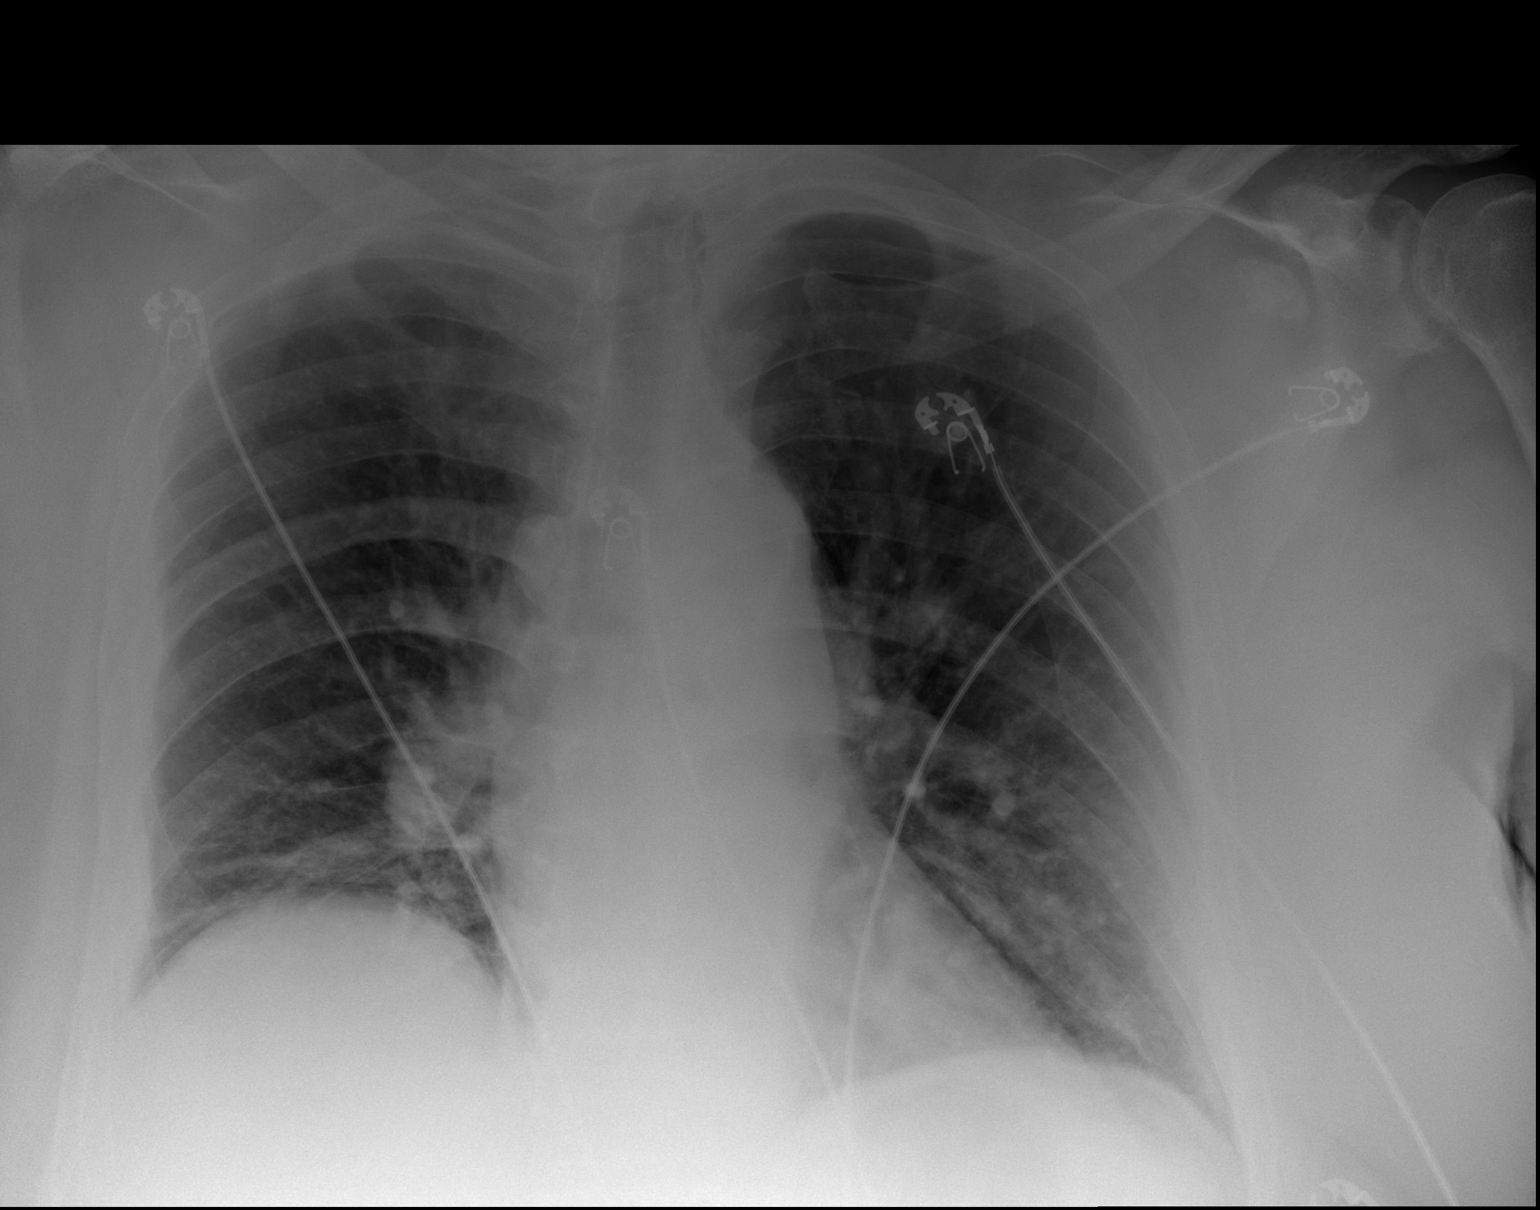

[2 of 2 positions shown; findings below may reference images not displayed]

FINDINGS: Slight elevation of the right hemidiaphragm is stable. Chronic right
base atelectasis or scarring. No confluent opacity on the left.
Heart is normal size. No effusions or acute bony abnormality.
IMPRESSION: Chronic right basilar atelectasis or scarring.  No acute findings.

## 2016-02-23 DIAGNOSIS — G4733 Obstructive sleep apnea (adult) (pediatric): Secondary | ICD-10-CM | POA: Diagnosis not present

## 2016-02-27 DIAGNOSIS — H25813 Combined forms of age-related cataract, bilateral: Secondary | ICD-10-CM | POA: Diagnosis not present

## 2016-02-27 DIAGNOSIS — Q829 Congenital malformation of skin, unspecified: Secondary | ICD-10-CM | POA: Diagnosis not present

## 2016-02-27 DIAGNOSIS — D2312 Other benign neoplasm of skin of left eyelid, including canthus: Secondary | ICD-10-CM | POA: Diagnosis not present

## 2016-02-27 LAB — HM DIABETES EYE EXAM

## 2016-03-05 ENCOUNTER — Other Ambulatory Visit: Payer: Self-pay | Admitting: Family Medicine

## 2016-03-09 ENCOUNTER — Other Ambulatory Visit: Payer: Self-pay | Admitting: *Deleted

## 2016-03-10 MED ORDER — ESOMEPRAZOLE MAGNESIUM 40 MG PO CPDR: 40.0000 mg | DELAYED_RELEASE_CAPSULE | Freq: Every day | ORAL | Status: DC

## 2016-03-26 ENCOUNTER — Other Ambulatory Visit: Payer: Self-pay | Admitting: *Deleted

## 2016-03-30 MED ORDER — TRAZODONE HCL 100 MG PO TABS: 100.0000 mg | ORAL_TABLET | Freq: Every evening | ORAL | Status: DC | PRN

## 2016-04-19 ENCOUNTER — Other Ambulatory Visit: Payer: Self-pay | Admitting: *Deleted

## 2016-04-19 MED ORDER — METFORMIN HCL 500 MG PO TABS: 500.0000 mg | ORAL_TABLET | Freq: Every day | ORAL | Status: DC

## 2016-04-22 ENCOUNTER — Other Ambulatory Visit: Payer: Self-pay | Admitting: *Deleted

## 2016-04-22 DIAGNOSIS — I10 Essential (primary) hypertension: Secondary | ICD-10-CM

## 2016-04-23 MED ORDER — LISINOPRIL 20 MG PO TABS: 20.0000 mg | ORAL_TABLET | Freq: Every day | ORAL | Status: DC

## 2016-04-26 DIAGNOSIS — N492 Inflammatory disorders of scrotum: Secondary | ICD-10-CM | POA: Diagnosis not present

## 2016-04-26 DIAGNOSIS — N5201 Erectile dysfunction due to arterial insufficiency: Secondary | ICD-10-CM | POA: Diagnosis not present

## 2016-04-26 DIAGNOSIS — Z8546 Personal history of malignant neoplasm of prostate: Secondary | ICD-10-CM | POA: Diagnosis not present

## 2016-04-26 DIAGNOSIS — E291 Testicular hypofunction: Secondary | ICD-10-CM | POA: Diagnosis not present

## 2016-05-10 ENCOUNTER — Other Ambulatory Visit: Payer: Self-pay | Admitting: *Deleted

## 2016-05-10 DIAGNOSIS — Z794 Long term (current) use of insulin: Principal | ICD-10-CM

## 2016-05-10 DIAGNOSIS — IMO0001 Reserved for inherently not codable concepts without codable children: Secondary | ICD-10-CM

## 2016-05-10 DIAGNOSIS — E1165 Type 2 diabetes mellitus with hyperglycemia: Principal | ICD-10-CM

## 2016-05-10 MED ORDER — INSULIN GLARGINE 100 UNIT/ML ~~LOC~~ SOLN: 85.0000 [IU] | Freq: Two times a day (BID) | SUBCUTANEOUS | Status: DC

## 2016-05-26 ENCOUNTER — Other Ambulatory Visit: Payer: Self-pay | Admitting: Family Medicine

## 2016-06-02 DIAGNOSIS — G4733 Obstructive sleep apnea (adult) (pediatric): Secondary | ICD-10-CM | POA: Diagnosis not present

## 2016-06-07 ENCOUNTER — Other Ambulatory Visit: Payer: Self-pay | Admitting: *Deleted

## 2016-06-07 DIAGNOSIS — M109 Gout, unspecified: Secondary | ICD-10-CM

## 2016-06-10 ENCOUNTER — Other Ambulatory Visit: Payer: Self-pay | Admitting: Family Medicine

## 2016-06-17 ENCOUNTER — Encounter: Payer: Self-pay | Admitting: Family Medicine

## 2016-06-17 ENCOUNTER — Ambulatory Visit (INDEPENDENT_AMBULATORY_CARE_PROVIDER_SITE_OTHER): Payer: Medicare Other | Admitting: Family Medicine

## 2016-06-17 VITALS — BP 138/62 | HR 62 | Temp 98.2°F | Ht 76.0 in | Wt 321.0 lb

## 2016-06-17 DIAGNOSIS — Z794 Long term (current) use of insulin: Secondary | ICD-10-CM

## 2016-06-17 DIAGNOSIS — E1165 Type 2 diabetes mellitus with hyperglycemia: Secondary | ICD-10-CM | POA: Diagnosis not present

## 2016-06-17 DIAGNOSIS — E118 Type 2 diabetes mellitus with unspecified complications: Secondary | ICD-10-CM | POA: Diagnosis not present

## 2016-06-17 DIAGNOSIS — IMO0002 Reserved for concepts with insufficient information to code with codable children: Secondary | ICD-10-CM

## 2016-06-17 DIAGNOSIS — E1142 Type 2 diabetes mellitus with diabetic polyneuropathy: Secondary | ICD-10-CM

## 2016-06-17 DIAGNOSIS — M1711 Unilateral primary osteoarthritis, right knee: Secondary | ICD-10-CM

## 2016-06-17 LAB — POCT GLYCOSYLATED HEMOGLOBIN (HGB A1C): Hemoglobin A1C: 7.2

## 2016-06-17 MED ORDER — MELOXICAM 15 MG PO TABS: 15.0000 mg | ORAL_TABLET | Freq: Every day | ORAL | 2 refills | Status: DC

## 2016-06-17 NOTE — Patient Instructions (Signed)
It was a pleasure seeing you today in our clinic. Today we discussed your diabetes and knee pain. Here is the treatment plan we have discussed and agreed upon together:   - At this time I'll not be making any changes to your diabetes medication regimen. - I like for you to try to increase your daily activity level. - For your knee pain and try the compression sleeve. However, I feel as though the most appropriate treatment at this time would be both therapeutic help strengthen up the muscles of your leg. If you decide that he would like to begin physical therapy just contact our office and leave a message for me and I will be happy to make that referral.

## 2016-06-17 NOTE — Progress Notes (Signed)
HPI  CC: Diabetes and knee pain. Patient is here for diabetes check. He states he has been doing well without any symptoms of hypoglycemia. He has been tolerating his medications well and denies any adverse effects. He has been significantly more compliant with his medication regimen over the past calendar year and is proud that his A1c continues to downtrend. Denies fever, chills, polyuria, polydipsia, nausea, vomiting, or diarrhea.  Patient is also complaining of some right-sided knee pain. He is s/p bilateral total knee arthroplasty. Pain and discomfort is noted around the joint line. He states that he occasionally feels his right knee "give out". Denies new swelling, erythema, ecchymoses, weakness, numbness, paresthesias.  Patient also endorses some right-sided distal finger numbness. Numbness is noted in all 4 fingers and did not seem to follow a dermatologic pattern. He denies any shooting pain from his C-spine. Numbness is worse at night. He denies any weakness.  Review of Systems   See HPI for ROS. All other systems reviewed and are negative.  CC, SH/smoking status, and VS noted  Objective: BP 138/62 (BP Location: Left Arm, Patient Position: Sitting, Cuff Size: Normal)   Pulse 62   Temp 98.2 F (36.8 C) (Oral)   Ht 6\' 4"  (1.93 m)   Wt (!) 321 lb (145.6 kg)   BMI 39.07 kg/m  Gen: NAD, alert, cooperative, and pleasant. Morbidly obese. CV: RRR, no murmur Resp: CTAB, no wheezes, non-labored Ext: No edema, warm, strength 5/5 throughout, no evidence of effusion in ankles/knees/wrists/fingers/elbows bilaterally. No significant range of motion deficits. Neuro: Alert and oriented, Speech clear, sensation deficit noted on right upper extremity distal digits 2-5 affecting the DIPs on the palmar aspect.   Assessment and plan:  Diabetes mellitus out of control Improved: A1c today 7.2 from 7.8. Patient endorses good compliance with medication regimen at this time. - Making no changes  to medication regimen at this time. - Patient and I had a long discussion about the inclusion of increased activity as a way of decreasing his A1c. I believe that this is the most appropriate next step as patient is currently on an incredibly high dose of long-acting and short acting insulin each day. - Follow-up 3 months  Osteoarthritis of right knee Patient is here with complaints of right knee pain. Discomfort noted at the joint line. Denies any injury or effusion. - Discussed likelihood of worsening osteoarthritis. - Meloxicam daily 2 weeks then when necessary after that. - Compression sleeve - I recommended physical therapy. Although patient did not refuse this treatment patient was very reluctant to move forward with this treatment course. At this time he just wants to try a compression sleeve and meloxicam. I informed him that I would be happy to write a referral to physical therapy if he changes his mind.  Diabetic neuropathy [facility]) Patient is complaining of right distal finger numbness. Numbness is generalized and located at the DIPs. Discussed the likelihood of diabetic neuropathy as numbness does not follow any dermatomal pattern. Differential also includes radicular pain from his C-spine. However this is deemed unlikely at this time. - Patient states that this pain is not severe enough to warrant any significant imaging or treatment. We will watch for now. - In the future consider: MRI C-spine versus Lyrica/Cymbalta.   Orders Placed This Encounter  Procedures  . POCT HgB A1C (CPT 83036)    Meds ordered this encounter  Medications  . meloxicam (MOBIC) 15 MG tablet    Sig: Take 1 tablet (15 mg  total) by mouth daily.    Dispense:  30 tablet    Refill:  2     Elberta Leatherwood, MD,MS,  PGY3 06/18/2016 5:15 PM

## 2016-06-18 NOTE — Assessment & Plan Note (Signed)
Patient is complaining of right distal finger numbness. Numbness is generalized and located at the DIPs. Discussed the likelihood of diabetic neuropathy as numbness does not follow any dermatomal pattern. Differential also includes radicular pain from his C-spine. However this is deemed unlikely at this time. - Patient states that this pain is not severe enough to warrant any significant imaging or treatment. We will watch for now. - In the future consider: MRI C-spine versus Lyrica/Cymbalta.

## 2016-06-18 NOTE — Assessment & Plan Note (Signed)
Patient is here with complaints of right knee pain. Discomfort noted at the joint line. Denies any injury or effusion. - Discussed likelihood of worsening osteoarthritis. - Meloxicam daily 2 weeks then when necessary after that. - Compression sleeve - I recommended physical therapy. Although patient did not refuse this treatment patient was very reluctant to move forward with this treatment course. At this time he just wants to try a compression sleeve and meloxicam. I informed him that I would be happy to write a referral to physical therapy if he changes his mind.

## 2016-06-18 NOTE — Assessment & Plan Note (Signed)
Improved: A1c today 7.2 from 7.8. Patient endorses good compliance with medication regimen at this time. - Making no changes to medication regimen at this time. - Patient and I had a long discussion about the inclusion of increased activity as a way of decreasing his A1c. I believe that this is the most appropriate next step as patient is currently on an incredibly high dose of long-acting and short acting insulin each day. - Follow-up 3 months

## 2016-07-08 ENCOUNTER — Other Ambulatory Visit: Payer: Self-pay | Admitting: Family Medicine

## 2016-07-20 ENCOUNTER — Other Ambulatory Visit: Payer: Self-pay | Admitting: Family Medicine

## 2016-07-20 DIAGNOSIS — IMO0002 Reserved for concepts with insufficient information to code with codable children: Secondary | ICD-10-CM

## 2016-07-20 DIAGNOSIS — E1165 Type 2 diabetes mellitus with hyperglycemia: Secondary | ICD-10-CM

## 2016-08-24 ENCOUNTER — Other Ambulatory Visit: Payer: Self-pay | Admitting: Family Medicine

## 2016-08-24 DIAGNOSIS — IMO0002 Reserved for concepts with insufficient information to code with codable children: Secondary | ICD-10-CM

## 2016-08-24 DIAGNOSIS — E1165 Type 2 diabetes mellitus with hyperglycemia: Secondary | ICD-10-CM

## 2016-08-25 ENCOUNTER — Other Ambulatory Visit: Payer: Self-pay | Admitting: Family Medicine

## 2016-08-25 DIAGNOSIS — IMO0002 Reserved for concepts with insufficient information to code with codable children: Secondary | ICD-10-CM

## 2016-08-25 DIAGNOSIS — E1165 Type 2 diabetes mellitus with hyperglycemia: Secondary | ICD-10-CM

## 2016-08-25 NOTE — Telephone Encounter (Signed)
Pt needs test strips for One touch Ultra Blue. Pt has been out for several weeks. Pt states he has been calling the pharmacy. Please advise. Thanks! ep

## 2016-09-13 DIAGNOSIS — G4733 Obstructive sleep apnea (adult) (pediatric): Secondary | ICD-10-CM | POA: Diagnosis not present

## 2016-10-20 DIAGNOSIS — N492 Inflammatory disorders of scrotum: Secondary | ICD-10-CM | POA: Diagnosis not present

## 2016-10-20 DIAGNOSIS — C61 Malignant neoplasm of prostate: Secondary | ICD-10-CM | POA: Diagnosis not present

## 2016-10-20 DIAGNOSIS — E291 Testicular hypofunction: Secondary | ICD-10-CM | POA: Diagnosis not present

## 2016-10-29 ENCOUNTER — Other Ambulatory Visit: Payer: Self-pay | Admitting: Family Medicine

## 2016-11-29 DIAGNOSIS — H25813 Combined forms of age-related cataract, bilateral: Secondary | ICD-10-CM | POA: Diagnosis not present

## 2016-11-29 DIAGNOSIS — D2312 Other benign neoplasm of skin of left eyelid, including canthus: Secondary | ICD-10-CM | POA: Diagnosis not present

## 2016-11-29 DIAGNOSIS — Q829 Congenital malformation of skin, unspecified: Secondary | ICD-10-CM | POA: Diagnosis not present

## 2016-12-06 ENCOUNTER — Other Ambulatory Visit: Payer: Self-pay | Admitting: Optometry

## 2016-12-06 DIAGNOSIS — L720 Epidermal cyst: Secondary | ICD-10-CM | POA: Diagnosis not present

## 2016-12-06 DIAGNOSIS — D2312 Other benign neoplasm of skin of left eyelid, including canthus: Secondary | ICD-10-CM | POA: Diagnosis not present

## 2016-12-09 ENCOUNTER — Other Ambulatory Visit: Payer: Self-pay | Admitting: Cardiovascular Disease

## 2016-12-13 ENCOUNTER — Other Ambulatory Visit: Payer: Self-pay | Admitting: Family Medicine

## 2016-12-13 DIAGNOSIS — IMO0001 Reserved for inherently not codable concepts without codable children: Secondary | ICD-10-CM

## 2016-12-13 DIAGNOSIS — E1165 Type 2 diabetes mellitus with hyperglycemia: Principal | ICD-10-CM

## 2016-12-13 DIAGNOSIS — Z794 Long term (current) use of insulin: Principal | ICD-10-CM

## 2016-12-15 ENCOUNTER — Telehealth: Payer: Self-pay | Admitting: Family Medicine

## 2016-12-15 NOTE — Telephone Encounter (Signed)
Please inform patient

## 2016-12-15 NOTE — Telephone Encounter (Signed)
Pt just got one bottle on Tuesday of his lantus and he was suppose to get 5. He is now out of lanuts. Pharmacy says they will not refill it until dr calls. [facility] on [address]

## 2016-12-15 NOTE — Telephone Encounter (Signed)
Pt informed. Deseree Blount, CMA  

## 2016-12-15 NOTE — Telephone Encounter (Signed)
Called. They will be able to refill completely first thing tomorrow.

## 2016-12-16 DIAGNOSIS — G4733 Obstructive sleep apnea (adult) (pediatric): Secondary | ICD-10-CM | POA: Diagnosis not present

## 2016-12-20 DIAGNOSIS — Q829 Congenital malformation of skin, unspecified: Secondary | ICD-10-CM | POA: Diagnosis not present

## 2016-12-20 DIAGNOSIS — H25813 Combined forms of age-related cataract, bilateral: Secondary | ICD-10-CM | POA: Diagnosis not present

## 2016-12-20 DIAGNOSIS — D2312 Other benign neoplasm of skin of left eyelid, including canthus: Secondary | ICD-10-CM | POA: Diagnosis not present

## 2017-01-04 ENCOUNTER — Encounter: Payer: Self-pay | Admitting: Cardiovascular Disease

## 2017-01-04 ENCOUNTER — Ambulatory Visit (INDEPENDENT_AMBULATORY_CARE_PROVIDER_SITE_OTHER): Payer: Medicare Other | Admitting: Cardiovascular Disease

## 2017-01-04 VITALS — BP 156/78 | HR 50 | Ht 77.0 in | Wt 333.2 lb

## 2017-01-04 DIAGNOSIS — E785 Hyperlipidemia, unspecified: Secondary | ICD-10-CM

## 2017-01-04 DIAGNOSIS — I10 Essential (primary) hypertension: Secondary | ICD-10-CM

## 2017-01-04 DIAGNOSIS — I2510 Atherosclerotic heart disease of native coronary artery without angina pectoris: Secondary | ICD-10-CM

## 2017-01-04 DIAGNOSIS — I739 Peripheral vascular disease, unspecified: Secondary | ICD-10-CM

## 2017-01-04 MED ORDER — METOPROLOL TARTRATE 50 MG PO TABS: 50.0000 mg | ORAL_TABLET | Freq: Two times a day (BID) | ORAL | 11 refills | Status: DC
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
## 2017-01-04 NOTE — Patient Instructions (Signed)
Medication Instructions:  Your physician has recommended you make the following change in your medication:  1. DECREASE Metoprolol Tartrate to 50mg  take one tablet by mouth twice a day  Labwork: No new orders.   Testing/Procedures: No new orders.   Follow-Up: Your physician wants you to follow-up in: 1 YEAR with Dr Fletcher Anon.  You will receive a reminder letter in the mail two months in advance. If you don't receive a letter, please call our office to schedule the follow-up appointment.   Any Other Special Instructions Will Be Listed Below (If Applicable).     If you need a refill on your cardiac medications before your next appointment, please call your pharmacy.

## 2017-01-05 ENCOUNTER — Encounter: Payer: Self-pay | Admitting: Family Medicine

## 2017-01-05 ENCOUNTER — Ambulatory Visit (INDEPENDENT_AMBULATORY_CARE_PROVIDER_SITE_OTHER): Payer: Medicare Other | Admitting: Family Medicine

## 2017-01-05 ENCOUNTER — Telehealth: Payer: Self-pay | Admitting: *Deleted

## 2017-01-05 VITALS — BP 120/68 | HR 53 | Temp 98.6°F | Ht 77.0 in | Wt 330.0 lb

## 2017-01-05 DIAGNOSIS — Z794 Long term (current) use of insulin: Secondary | ICD-10-CM | POA: Diagnosis not present

## 2017-01-05 DIAGNOSIS — E118 Type 2 diabetes mellitus with unspecified complications: Secondary | ICD-10-CM

## 2017-01-05 DIAGNOSIS — IMO0002 Reserved for concepts with insufficient information to code with codable children: Secondary | ICD-10-CM

## 2017-01-05 DIAGNOSIS — E1165 Type 2 diabetes mellitus with hyperglycemia: Secondary | ICD-10-CM | POA: Diagnosis not present

## 2017-01-05 DIAGNOSIS — I10 Essential (primary) hypertension: Secondary | ICD-10-CM

## 2017-01-05 DIAGNOSIS — M545 Low back pain, unspecified: Secondary | ICD-10-CM

## 2017-01-05 LAB — HEMOGLOBIN A1C: Hemoglobin A1C: 8.2

## 2017-01-05 MED ORDER — DICLOFENAC SODIUM 1 % TD GEL: 4.0000 g | Freq: Three times a day (TID) | TRANSDERMAL | 1 refills | Status: DC | PRN

## 2017-01-05 MED ORDER — TRAMADOL HCL 50 MG PO TABS: 50.0000 mg | ORAL_TABLET | Freq: Three times a day (TID) | ORAL | 0 refills | Status: DC | PRN

## 2017-01-05 NOTE — Telephone Encounter (Signed)
Prior Authorization received from [facility] for Diclofenac Sodium 1% gel. Formulary and PA form placed in provider box for completion. Derl Barrow, RN

## 2017-01-05 NOTE — Assessment & Plan Note (Signed)
Patient is here with complaints of acute lumbar back pain. Etiology unknown however most likely cause is lumbar paraspinal muscle strain/tear. Patient reported a painful mass that was noted after the injury. This was likely a hematoma from muscle tears experienced during the injury. Differential also includes lumbar spine compression fracture vs herniated disc. Fortunately, this is felt to be less likely due to patient's pain being localized and not extending down the lower extremities. No evidence of cauda equina.  - Lumbar spine x-rays ordered to rule out compression fracture. - Discussed ice and heat as needed. - Rest, but continued ambulation - Short course of tramadol for severe pain; encouraged Tylenol for most discomfort and pain. - Voltaren gel as anti-inflammatory >> avoiding systemic anti-inflammatories due to reduced kidney function and uncontrolled diabetes. - Discussed return precautions - Discussed pain expectations >> as pain may last 6-8 months.

## 2017-01-05 NOTE — Assessment & Plan Note (Signed)
Uncontrolled/worsening: A1c 8.2, up from 7.2 at his last checkup. Patient endorsed worsening compliance with diet. He insists on good compliance with his medication regimen which his wife vouches for. - Continue insulin regimen - Discussed tightening down on diet - Discussed increased ambulation and activity. - Recheck A1c in 3 months

## 2017-01-05 NOTE — Patient Instructions (Signed)
It was a pleasure seeing you today in our clinic. Today we discussed your diabetes and back pain. Here is the treatment plan we have discussed and agreed upon together:   - Apply the diclofenac gel to the area of soreness 3 times a day as needed. - Take Tylenol for pain. - I provided you 30 tablets of tramadol to help with pain as well. Use this only as needed for significant pain. - Use ice or heat on the area of soreness. - I have ordered a x-ray to be performed on your lower back. - Come back and see me in 3-4 weeks to recheck status of your back pain and your heart rate. - Keep in mind that the pain that you are experiencing from this strained muscle will likely take 6-8 weeks to fully resolve.

## 2017-01-05 NOTE — Progress Notes (Signed)
HPI  CC: Diabetes and acute back pain. Patient is here for follow-up on his diabetes and is complaining of acute back pain. Regarding his diabetes patient endorses good compliance with his medication regimen at this time. He states that he has had no issues with symptoms consistent with hypoglycemic events. Overall he feels well but he knows that he has been "cheating" regarding his diet. Patient was anticipating a worse A1c today. He denies any recent fevers, chills, nausea, vomiting, diarrhea, vision changes, lightheadedness, diaphoresis, numbness, weakness, or paresthesias.  Acute back pain: Patient states that last week he experienced acute onset strain in his low back. Patient was carrying groceries at the time when one of the bags ribs. Patient instinctively lunged down for the dropped groceries when he felt something pull in his left low back. This event occurred on 12/21/16. Since that time he has had significant discomfort along his low back. Pain is exacerbated with fast movement and twisting. He denies any radiation of symptoms. No numbness, weakness, or paresthesias down his legs. Denies any urinary or fecal incontinence. He denies any saddle anesthesia. His wife states that she has noticed a firm and painful bulge along the area in which patient was experiencing the greatest amount of pain. She denies noticing any ecchymoses or erythema in this area.  Review of Systems See HPI for ROS.   CC, SH/smoking status, and VS noted  Objective: BP 120/68   Pulse (!) 53   Temp 98.6 F (37 C) (Oral)   Ht 6\' 5"  (1.956 m)   Wt (!) 330 lb (149.7 kg)   SpO2 94%   BMI 39.13 kg/m  Gen: NAD, alert, cooperative, and pleasant. CV: RRR, no murmur Resp: CTAB, no wheezes, non-labored Ext: No edema, warm, sensation intact throughout, 5/5 strength bilaterally Back: On inspection no evidence of erythema, ecchymoses, or obvious deformities. Significant tenderness along the left paraspinal muscles at  the level of L3-L4-L5. No radiation of pain. Some acute tenderness also noted at the midline of L4-L5.  Neuro: Alert and oriented, Speech clear, No gross deficits   Assessment and plan:  BACK PAIN, LUMBAR Patient is here with complaints of acute lumbar back pain. Etiology unknown however most likely cause is lumbar paraspinal muscle strain/tear. Patient reported a painful mass that was noted after the injury. This was likely a hematoma from muscle tears experienced during the injury. Differential also includes lumbar spine compression fracture vs herniated disc. Fortunately, this is felt to be less likely due to patient's pain being localized and not extending down the lower extremities. No evidence of cauda equina.  - Lumbar spine x-rays ordered to rule out compression fracture. - Discussed ice and heat as needed. - Rest, but continued ambulation - Short course of tramadol for severe pain; encouraged Tylenol for most discomfort and pain. - Voltaren gel as anti-inflammatory >> avoiding systemic anti-inflammatories due to reduced kidney function and uncontrolled diabetes. - Discussed return precautions - Discussed pain expectations >> as pain may last 6-8 months.  Diabetes mellitus out of control Uncontrolled/worsening: A1c 8.2, up from 7.2 at his last checkup. Patient endorsed worsening compliance with diet. He insists on good compliance with his medication regimen which his wife vouches for. - Continue insulin regimen - Discussed tightening down on diet - Discussed increased ambulation and activity. - Recheck A1c in 3 months  HYPERTENSION, BENIGN SYSTEMIC Only discussed minimally during today's visit however patient's cardiologist reduced his metoprolol dosing at his visit with him yesterday. Patient's heart rate notably  bradycardiac today. Will follow-up heart rate at next visit and may need to decrease metoprolol dosing if bradycardia persists.   Orders Placed This Encounter    Procedures  . DG Lumbar Spine Complete    Standing Status:   Future    Standing Expiration Date:   03/07/2018    Order Specific Question:   Reason for Exam (SYMPTOM  OR DIAGNOSIS REQUIRED)    Answer:   Acute low back pain    Order Specific Question:   Preferred imaging location?    Answer:   [facility]  . Hemoglobin A1c    This order was created through External Result Entry    Meds ordered this encounter  Medications  . diclofenac sodium (VOLTAREN) 1 % GEL    Sig: Apply 4 g topically 3 (three) times daily as needed.    Dispense:  200 g    Refill:  1  . traMADol (ULTRAM) 50 MG tablet    Sig: Take 1 tablet (50 mg total) by mouth every 8 (eight) hours as needed.    Dispense:  30 tablet    Refill:  0     Elberta Leatherwood, MD,MS,  PGY3 01/05/2017 6:18 PM

## 2017-01-05 NOTE — Telephone Encounter (Signed)
Placed on Tamika's desk

## 2017-01-05 NOTE — Assessment & Plan Note (Signed)
Only discussed minimally during today's visit however patient's cardiologist reduced his metoprolol dosing at his visit with him yesterday. Patient's heart rate notably bradycardiac today. Will follow-up heart rate at next visit and may need to decrease metoprolol dosing if bradycardia persists.

## 2017-01-06 ENCOUNTER — Ambulatory Visit
Admission: RE | Admit: 2017-01-06 | Discharge: 2017-01-06 | Disposition: A | Discharge status: Home / Self Care | Payer: Medicare Other | Source: Ambulatory Visit | Attending: Family Medicine | Admitting: Family Medicine

## 2017-01-06 DIAGNOSIS — M47816 Spondylosis without myelopathy or radiculopathy, lumbar region: Secondary | ICD-10-CM | POA: Diagnosis not present

## 2017-01-06 DIAGNOSIS — M545 Low back pain, unspecified: Secondary | ICD-10-CM

## 2017-01-06 NOTE — Telephone Encounter (Signed)
PA completed online at [email or web address] and  approved via [employer] until 10/31/17. Reference number: [id number]. Derl Barrow, RN

## 2017-01-07 ENCOUNTER — Other Ambulatory Visit: Payer: Self-pay | Admitting: Family Medicine

## 2017-01-17 ENCOUNTER — Telehealth: Payer: Self-pay | Admitting: Family Medicine

## 2017-01-17 NOTE — Telephone Encounter (Signed)
Pt states Dr. Alease Frame or his nurse called Friday to discuss x-ray results. Pt would like PCP to call him back. ep

## 2017-01-18 NOTE — Telephone Encounter (Signed)
Called patient. Got voicemail. Informed him that I would leave my interpretations of the lumbar (spine) x-ray in this note so it could be relayed to him if/when he calls back:  Lumbar x-ray looks relatively unchanged from previous imaging. Some signs of arthritis but NO signs of fracture or malalignment.   This x-ray provide some evidence that supports my initial diagnosis that he likely had experienced a significant muscle strain with only soft tissue involvement, and it is unlikely that there was any acute bony damage that took place.

## 2017-01-20 NOTE — Telephone Encounter (Signed)
Left another message for patient to return call.

## 2017-02-21 DIAGNOSIS — E113211 Type 2 diabetes mellitus with mild nonproliferative diabetic retinopathy with macular edema, right eye: Secondary | ICD-10-CM | POA: Diagnosis not present

## 2017-02-21 DIAGNOSIS — D2312 Other benign neoplasm of skin of left eyelid, including canthus: Secondary | ICD-10-CM | POA: Diagnosis not present

## 2017-02-21 DIAGNOSIS — H25813 Combined forms of age-related cataract, bilateral: Secondary | ICD-10-CM | POA: Diagnosis not present

## 2017-02-21 DIAGNOSIS — Q829 Congenital malformation of skin, unspecified: Secondary | ICD-10-CM | POA: Diagnosis not present

## 2017-02-21 LAB — HM DIABETES EYE EXAM

## 2017-03-04 ENCOUNTER — Other Ambulatory Visit: Payer: Self-pay | Admitting: Family Medicine

## 2017-03-05 ENCOUNTER — Other Ambulatory Visit: Payer: Self-pay | Admitting: Family Medicine

## 2017-03-10 ENCOUNTER — Encounter (INDEPENDENT_AMBULATORY_CARE_PROVIDER_SITE_OTHER): Payer: Medicare Other | Admitting: Ophthalmology

## 2017-03-10 DIAGNOSIS — H35033 Hypertensive retinopathy, bilateral: Secondary | ICD-10-CM | POA: Diagnosis not present

## 2017-03-10 DIAGNOSIS — E113292 Type 2 diabetes mellitus with mild nonproliferative diabetic retinopathy without macular edema, left eye: Secondary | ICD-10-CM | POA: Diagnosis not present

## 2017-03-10 DIAGNOSIS — E113211 Type 2 diabetes mellitus with mild nonproliferative diabetic retinopathy with macular edema, right eye: Secondary | ICD-10-CM

## 2017-03-10 DIAGNOSIS — E11311 Type 2 diabetes mellitus with unspecified diabetic retinopathy with macular edema: Secondary | ICD-10-CM | POA: Diagnosis not present

## 2017-03-10 DIAGNOSIS — H43813 Vitreous degeneration, bilateral: Secondary | ICD-10-CM

## 2017-03-10 DIAGNOSIS — I10 Essential (primary) hypertension: Secondary | ICD-10-CM

## 2017-03-10 DIAGNOSIS — H2513 Age-related nuclear cataract, bilateral: Secondary | ICD-10-CM

## 2017-03-23 DIAGNOSIS — G4733 Obstructive sleep apnea (adult) (pediatric): Secondary | ICD-10-CM | POA: Diagnosis not present

## 2017-04-04 ENCOUNTER — Encounter (INDEPENDENT_AMBULATORY_CARE_PROVIDER_SITE_OTHER): Payer: Medicare Other | Admitting: Ophthalmology

## 2017-04-04 DIAGNOSIS — H35033 Hypertensive retinopathy, bilateral: Secondary | ICD-10-CM

## 2017-04-04 DIAGNOSIS — H43813 Vitreous degeneration, bilateral: Secondary | ICD-10-CM | POA: Diagnosis not present

## 2017-04-04 DIAGNOSIS — H2513 Age-related nuclear cataract, bilateral: Secondary | ICD-10-CM

## 2017-04-04 DIAGNOSIS — E113292 Type 2 diabetes mellitus with mild nonproliferative diabetic retinopathy without macular edema, left eye: Secondary | ICD-10-CM

## 2017-04-04 DIAGNOSIS — I10 Essential (primary) hypertension: Secondary | ICD-10-CM

## 2017-04-04 DIAGNOSIS — E113311 Type 2 diabetes mellitus with moderate nonproliferative diabetic retinopathy with macular edema, right eye: Secondary | ICD-10-CM

## 2017-05-02 ENCOUNTER — Encounter (INDEPENDENT_AMBULATORY_CARE_PROVIDER_SITE_OTHER): Payer: Medicare Other | Admitting: Ophthalmology

## 2017-05-02 DIAGNOSIS — H35033 Hypertensive retinopathy, bilateral: Secondary | ICD-10-CM | POA: Diagnosis not present

## 2017-05-02 DIAGNOSIS — H43813 Vitreous degeneration, bilateral: Secondary | ICD-10-CM | POA: Diagnosis not present

## 2017-05-02 DIAGNOSIS — H2513 Age-related nuclear cataract, bilateral: Secondary | ICD-10-CM | POA: Diagnosis not present

## 2017-05-02 DIAGNOSIS — E11311 Type 2 diabetes mellitus with unspecified diabetic retinopathy with macular edema: Secondary | ICD-10-CM | POA: Diagnosis not present

## 2017-05-02 DIAGNOSIS — I10 Essential (primary) hypertension: Secondary | ICD-10-CM

## 2017-05-02 DIAGNOSIS — E113311 Type 2 diabetes mellitus with moderate nonproliferative diabetic retinopathy with macular edema, right eye: Secondary | ICD-10-CM

## 2017-05-02 DIAGNOSIS — E113292 Type 2 diabetes mellitus with mild nonproliferative diabetic retinopathy without macular edema, left eye: Secondary | ICD-10-CM | POA: Diagnosis not present

## 2017-05-27 ENCOUNTER — Encounter (INDEPENDENT_AMBULATORY_CARE_PROVIDER_SITE_OTHER): Payer: Medicare Other | Admitting: Ophthalmology

## 2017-05-27 DIAGNOSIS — E113311 Type 2 diabetes mellitus with moderate nonproliferative diabetic retinopathy with macular edema, right eye: Secondary | ICD-10-CM

## 2017-05-27 DIAGNOSIS — E113292 Type 2 diabetes mellitus with mild nonproliferative diabetic retinopathy without macular edema, left eye: Secondary | ICD-10-CM | POA: Diagnosis not present

## 2017-05-27 DIAGNOSIS — H35033 Hypertensive retinopathy, bilateral: Secondary | ICD-10-CM

## 2017-05-27 DIAGNOSIS — E11311 Type 2 diabetes mellitus with unspecified diabetic retinopathy with macular edema: Secondary | ICD-10-CM | POA: Diagnosis not present

## 2017-05-27 DIAGNOSIS — H43813 Vitreous degeneration, bilateral: Secondary | ICD-10-CM

## 2017-05-27 DIAGNOSIS — D3132 Benign neoplasm of left choroid: Secondary | ICD-10-CM

## 2017-05-27 DIAGNOSIS — I10 Essential (primary) hypertension: Secondary | ICD-10-CM | POA: Diagnosis not present

## 2017-06-09 ENCOUNTER — Other Ambulatory Visit: Payer: Self-pay | Admitting: Family Medicine

## 2017-06-09 DIAGNOSIS — Z794 Long term (current) use of insulin: Principal | ICD-10-CM

## 2017-06-09 DIAGNOSIS — E1165 Type 2 diabetes mellitus with hyperglycemia: Principal | ICD-10-CM

## 2017-06-09 DIAGNOSIS — IMO0001 Reserved for inherently not codable concepts without codable children: Secondary | ICD-10-CM

## 2017-06-11 ENCOUNTER — Other Ambulatory Visit: Payer: Self-pay | Admitting: Family Medicine

## 2017-06-12 ENCOUNTER — Other Ambulatory Visit: Payer: Self-pay | Admitting: Family Medicine

## 2017-06-15 ENCOUNTER — Other Ambulatory Visit: Payer: Self-pay | Admitting: Family Medicine

## 2017-06-15 DIAGNOSIS — IMO0001 Reserved for inherently not codable concepts without codable children: Secondary | ICD-10-CM

## 2017-06-15 DIAGNOSIS — E1165 Type 2 diabetes mellitus with hyperglycemia: Principal | ICD-10-CM

## 2017-06-15 DIAGNOSIS — Z794 Long term (current) use of insulin: Principal | ICD-10-CM

## 2017-06-15 NOTE — Telephone Encounter (Signed)
Patient has appointment scheduled for 8/22 with PCP.

## 2017-06-15 NOTE — Telephone Encounter (Signed)
I will refill his insulin so that he doesn't run out, but he needs to be seen. He is overdue for an A1c and to recheck diabetes. Please call him and schedule him with me.

## 2017-06-16 ENCOUNTER — Other Ambulatory Visit: Payer: Self-pay | Admitting: Family Medicine

## 2017-06-22 ENCOUNTER — Ambulatory Visit (INDEPENDENT_AMBULATORY_CARE_PROVIDER_SITE_OTHER): Payer: Medicare Other | Admitting: Family Medicine

## 2017-06-22 ENCOUNTER — Encounter: Payer: Self-pay | Admitting: Family Medicine

## 2017-06-22 VITALS — BP 128/68 | HR 56 | Temp 98.1°F | Ht 77.0 in | Wt 328.2 lb

## 2017-06-22 DIAGNOSIS — E1165 Type 2 diabetes mellitus with hyperglycemia: Secondary | ICD-10-CM

## 2017-06-22 DIAGNOSIS — E118 Type 2 diabetes mellitus with unspecified complications: Secondary | ICD-10-CM | POA: Diagnosis not present

## 2017-06-22 DIAGNOSIS — Z794 Long term (current) use of insulin: Secondary | ICD-10-CM | POA: Diagnosis not present

## 2017-06-22 DIAGNOSIS — I10 Essential (primary) hypertension: Secondary | ICD-10-CM | POA: Diagnosis not present

## 2017-06-22 DIAGNOSIS — Z1159 Encounter for screening for other viral diseases: Secondary | ICD-10-CM | POA: Diagnosis not present

## 2017-06-22 DIAGNOSIS — IMO0002 Reserved for concepts with insufficient information to code with codable children: Secondary | ICD-10-CM

## 2017-06-22 DIAGNOSIS — M109 Gout, unspecified: Secondary | ICD-10-CM

## 2017-06-22 DIAGNOSIS — IMO0001 Reserved for inherently not codable concepts without codable children: Secondary | ICD-10-CM

## 2017-06-22 LAB — POCT GLYCOSYLATED HEMOGLOBIN (HGB A1C): Hemoglobin A1C: 7.1

## 2017-06-22 MED ORDER — INSULIN LISPRO 100 UNIT/ML (KWIKPEN): PEN_INJECTOR | SUBCUTANEOUS | 3 refills | Status: DC

## 2017-06-22 MED ORDER — INSULIN GLARGINE 100 UNIT/ML ~~LOC~~ SOLN: 85.0000 [IU] | Freq: Two times a day (BID) | SUBCUTANEOUS | 6 refills | Status: DC

## 2017-06-22 NOTE — Progress Notes (Signed)
   CC: meet new PCP   HPI DM - has decreased carbs and sweets. 85U Lantus BID. No lows. Uses humalog pen up to 20 U with meals if he eats carbs. Has been compliant with his DM meds. Checks his feet and they have a pedicurist who comes to their home to clip their toenails.  HTN - checks his BP at home.   Gout - patient reports multiple flares in the last 2 months. Requests refill of his mobic. This was discontinued in the past due to CKD. No uric acid suppressing medications that the patient recalls.   ROS: denies CP, SOB, abd pain, changes in bowel or bladder habits, rash.    CC, SH/smoking status, and VS noted  Objective: BP 128/68   Pulse (!) 56   Temp 98.1 F (36.7 C) (Oral)   Ht 6\' 5"  (1.956 m)   Wt (!) 328 lb 3.2 oz (148.9 kg)   SpO2 95%   BMI 38.92 kg/m  Gen: NAD, alert, cooperative, and pleasant. HEENT: NCAT, EOMI, PERRL CV: RRR, no murmur Resp: CTAB, no wheezes, non-labored Abd: SNTND, BS present, no guarding or organomegaly Ext: No edema, warm Neuro: Alert and oriented, Speech clear, No gross deficits Diabetic Foot Exam - Simple   Simple Foot Form Diabetic Foot exam was performed with the following findings:  Yes 06/22/2017 11:30 AM  Visual Inspection No deformities, no ulcerations, no other skin breakdown bilaterally:  Yes Sensation Testing Intact to touch and monofilament testing bilaterally:  Yes Pulse Check Posterior Tibialis and Dorsalis pulse intact bilaterally:  Yes Comments     Assessment and plan:  HYPERTENSION, BENIGN SYSTEMIC BP well controlled. Mildly bradycardic, but patient does not feel unwell. Will continue current meds.  Diabetes mellitus out of control HgbA1c improved today. Patient reports increased dietary compliance. Congratulated him on his success. Continue current regimen.  Gouty arthropathy Patient likely needs uric acid suppressing therapy, would consider renally dosed allopurinol. Will obtain BMP and tailor meds based on this Cr  due to no Cr for >1 year in our system. Will call patient with plan after labs result.   Orders Placed This Encounter  Procedures  . Basic metabolic panel  . Hepatitis C antibody  . CBC  . HgB A1c    Meds ordered this encounter  Medications  . insulin glargine (LANTUS) 100 UNIT/ML injection    Sig: Inject 0.85 mLs (85 Units total) into the skin 2 (two) times daily.    Dispense:  50 mL    Refill:  6  . insulin lispro (HUMALOG KWIKPEN) 100 UNIT/ML KiwkPen    Sig: INJECT 20 UNITS INTO THE SKIN THREE TIMES A DAY. USE UP TO 50 UNITS PER DAY AS DIRECTED BY MD    Dispense:  15 mL    Refill:  3    PLease dispense QS for 1 month    Health Maintenance reviewed - ROI filled out for both colonoscopy records and diabetic eye exams.  Ralene Ok, MD, PGY2 06/22/2017 2:43 PM

## 2017-06-22 NOTE — Patient Instructions (Signed)
It was a pleasure to see you today! Thank you for choosing [facility] for your primary care. Adam [name] was seen for BP, DM, gout.   Our plans for today were:  I will call you on Friday about your gout medicine.   Keep up the good work with your diet.   You should return to our clinic to see Dr. Lindell Noe in 3 months for DM.   Best,  Dr. Lindell Noe

## 2017-06-22 NOTE — Assessment & Plan Note (Signed)
HgbA1c improved today. Patient reports increased dietary compliance. Congratulated him on his success. Continue current regimen.

## 2017-06-22 NOTE — Assessment & Plan Note (Signed)
Patient likely needs uric acid suppressing therapy, would consider renally dosed allopurinol. Will obtain BMP and tailor meds based on this Cr due to no Cr for >1 year in our system. Will call patient with plan after labs result.

## 2017-06-22 NOTE — Assessment & Plan Note (Signed)
BP well controlled. Mildly bradycardic, but patient does not feel unwell. Will continue current meds.

## 2017-06-23 LAB — BASIC METABOLIC PANEL
BUN / CREAT RATIO: 13 (ref 10–24)
BUN: 18 mg/dL (ref 8–27)
CHLORIDE: 100 mmol/L (ref 96–106)
CO2: 23 mmol/L (ref 20–29)
Calcium: 9.3 mg/dL (ref 8.6–10.2)
Creatinine, Ser: 1.36 mg/dL — ABNORMAL HIGH (ref 0.76–1.27)
GFR calc non Af Amer: 52 mL/min/{1.73_m2} — ABNORMAL LOW (ref >59)
GFR, EST AFRICAN AMERICAN: 61 mL/min/{1.73_m2} (ref >59)
Glucose: 126 mg/dL — ABNORMAL HIGH (ref 65–99)
POTASSIUM: 4.6 mmol/L (ref 3.5–5.2)
Sodium: 140 mmol/L (ref 134–144)

## 2017-06-23 LAB — CBC
Hematocrit: 43.9 % (ref 37.5–51.0)
Hemoglobin: 14.5 g/dL (ref 13.0–17.7)
MCH: 27.7 pg (ref 26.6–33.0)
MCHC: 33 g/dL (ref 31.5–35.7)
MCV: 84 fL (ref 79–97)
PLATELETS: 239 10*3/uL (ref 150–379)
RBC: 5.24 x10E6/uL (ref 4.14–5.80)
RDW: 16.2 % — ABNORMAL HIGH (ref 12.3–15.4)
WBC: 7.1 10*3/uL (ref 3.4–10.8)

## 2017-06-23 LAB — HEPATITIS C ANTIBODY: Hep C Virus Ab: <0.1 s/co ratio (ref 0.0–0.9)

## 2017-06-24 ENCOUNTER — Encounter (INDEPENDENT_AMBULATORY_CARE_PROVIDER_SITE_OTHER): Payer: Medicare Other | Admitting: Ophthalmology

## 2017-06-24 ENCOUNTER — Other Ambulatory Visit: Payer: Self-pay | Admitting: Family Medicine

## 2017-06-24 DIAGNOSIS — E11311 Type 2 diabetes mellitus with unspecified diabetic retinopathy with macular edema: Secondary | ICD-10-CM | POA: Diagnosis not present

## 2017-06-24 DIAGNOSIS — E113391 Type 2 diabetes mellitus with moderate nonproliferative diabetic retinopathy without macular edema, right eye: Secondary | ICD-10-CM | POA: Diagnosis not present

## 2017-06-24 DIAGNOSIS — H35033 Hypertensive retinopathy, bilateral: Secondary | ICD-10-CM

## 2017-06-24 DIAGNOSIS — E113292 Type 2 diabetes mellitus with mild nonproliferative diabetic retinopathy without macular edema, left eye: Secondary | ICD-10-CM | POA: Diagnosis not present

## 2017-06-24 DIAGNOSIS — M1A0710 Idiopathic chronic gout, right ankle and foot, without tophus (tophi): Secondary | ICD-10-CM

## 2017-06-24 DIAGNOSIS — H43813 Vitreous degeneration, bilateral: Secondary | ICD-10-CM | POA: Diagnosis not present

## 2017-06-24 DIAGNOSIS — I10 Essential (primary) hypertension: Secondary | ICD-10-CM | POA: Diagnosis not present

## 2017-06-27 ENCOUNTER — Other Ambulatory Visit: Payer: Medicare Other

## 2017-06-27 DIAGNOSIS — M1A0710 Idiopathic chronic gout, right ankle and foot, without tophus (tophi): Secondary | ICD-10-CM

## 2017-06-28 LAB — URIC ACID: URIC ACID: 5.7 mg/dL (ref 3.7–8.6)

## 2017-06-29 DIAGNOSIS — G4733 Obstructive sleep apnea (adult) (pediatric): Secondary | ICD-10-CM | POA: Diagnosis not present

## 2017-07-01 ENCOUNTER — Other Ambulatory Visit: Payer: Self-pay | Admitting: *Deleted

## 2017-07-03 MED ORDER — METFORMIN HCL 500 MG PO TABS: 500.0000 mg | ORAL_TABLET | Freq: Every day | ORAL | 3 refills | Status: DC

## 2017-07-07 ENCOUNTER — Other Ambulatory Visit: Payer: Self-pay | Admitting: Family Medicine

## 2017-07-07 ENCOUNTER — Telehealth: Payer: Self-pay | Admitting: *Deleted

## 2017-07-07 DIAGNOSIS — I10 Essential (primary) hypertension: Secondary | ICD-10-CM

## 2017-07-07 MED ORDER — LISINOPRIL 20 MG PO TABS: 20.0000 mg | ORAL_TABLET | Freq: Every day | ORAL | 1 refills | Status: DC

## 2017-07-07 NOTE — Telephone Encounter (Signed)
Patient called back about his uric acid level.  He would like to know if he can get a refill on his meloxicam that he will use when the pain starts up. Jazmin Hartsell,CMA

## 2017-07-08 MED ORDER — COLCHICINE 0.6 MG PO CAPS: 0.6000 mg | ORAL_CAPSULE | Freq: Three times a day (TID) | ORAL | 2 refills | Status: DC

## 2017-07-08 NOTE — Telephone Encounter (Signed)
Called patient to discuss uric acid level (<6), and the fact that we will not start allopurinol based on this. Given CKD, will not use meloxicam for flares. Instead will prescribe colchicine for acute flares. Patient expresses understanding of the plan.

## 2017-07-21 ENCOUNTER — Other Ambulatory Visit: Payer: Self-pay | Admitting: Family Medicine

## 2017-07-22 ENCOUNTER — Encounter (INDEPENDENT_AMBULATORY_CARE_PROVIDER_SITE_OTHER): Payer: Medicare Other | Admitting: Ophthalmology

## 2017-08-01 ENCOUNTER — Encounter (INDEPENDENT_AMBULATORY_CARE_PROVIDER_SITE_OTHER): Payer: Medicare Other | Admitting: Ophthalmology

## 2017-08-01 ENCOUNTER — Other Ambulatory Visit: Payer: Self-pay | Admitting: Family Medicine

## 2017-08-01 DIAGNOSIS — E113311 Type 2 diabetes mellitus with moderate nonproliferative diabetic retinopathy with macular edema, right eye: Secondary | ICD-10-CM | POA: Diagnosis not present

## 2017-08-01 DIAGNOSIS — I10 Essential (primary) hypertension: Secondary | ICD-10-CM | POA: Diagnosis not present

## 2017-08-01 DIAGNOSIS — D3132 Benign neoplasm of left choroid: Secondary | ICD-10-CM

## 2017-08-01 DIAGNOSIS — E11311 Type 2 diabetes mellitus with unspecified diabetic retinopathy with macular edema: Secondary | ICD-10-CM

## 2017-08-01 DIAGNOSIS — H2513 Age-related nuclear cataract, bilateral: Secondary | ICD-10-CM

## 2017-08-01 DIAGNOSIS — E113392 Type 2 diabetes mellitus with moderate nonproliferative diabetic retinopathy without macular edema, left eye: Secondary | ICD-10-CM

## 2017-08-01 DIAGNOSIS — H43813 Vitreous degeneration, bilateral: Secondary | ICD-10-CM

## 2017-08-01 DIAGNOSIS — H35033 Hypertensive retinopathy, bilateral: Secondary | ICD-10-CM | POA: Diagnosis not present

## 2017-08-08 ENCOUNTER — Other Ambulatory Visit: Payer: Self-pay | Admitting: Family Medicine

## 2017-08-09 ENCOUNTER — Encounter: Payer: Self-pay | Admitting: Family Medicine

## 2017-08-09 ENCOUNTER — Other Ambulatory Visit: Payer: Self-pay | Admitting: Family Medicine

## 2017-08-09 DIAGNOSIS — E1165 Type 2 diabetes mellitus with hyperglycemia: Secondary | ICD-10-CM

## 2017-08-09 MED ORDER — INSULIN PEN NEEDLE 31G X 5 MM MISC: 12 refills | Status: DC

## 2017-08-09 MED ORDER — "INSULIN SYRINGE 30G X 1/2"" 0.5 ML MISC": 1.0000 | 12 refills | Status: DC | PRN

## 2017-08-09 NOTE — Telephone Encounter (Signed)
Patient was in with his wife today, states he is out of insulin needles. Will send refill.

## 2017-08-30 ENCOUNTER — Telehealth: Payer: Self-pay | Admitting: Family Medicine

## 2017-08-30 NOTE — Telephone Encounter (Signed)
Received refill request for glucose test strips. Provided faxed refill + 12 refills.

## 2017-08-31 ENCOUNTER — Encounter (INDEPENDENT_AMBULATORY_CARE_PROVIDER_SITE_OTHER): Payer: Medicare Other | Admitting: Ophthalmology

## 2017-08-31 DIAGNOSIS — H35033 Hypertensive retinopathy, bilateral: Secondary | ICD-10-CM | POA: Diagnosis not present

## 2017-08-31 DIAGNOSIS — E113292 Type 2 diabetes mellitus with mild nonproliferative diabetic retinopathy without macular edema, left eye: Secondary | ICD-10-CM

## 2017-08-31 DIAGNOSIS — E11311 Type 2 diabetes mellitus with unspecified diabetic retinopathy with macular edema: Secondary | ICD-10-CM

## 2017-08-31 DIAGNOSIS — E113211 Type 2 diabetes mellitus with mild nonproliferative diabetic retinopathy with macular edema, right eye: Secondary | ICD-10-CM | POA: Diagnosis not present

## 2017-08-31 DIAGNOSIS — D3132 Benign neoplasm of left choroid: Secondary | ICD-10-CM

## 2017-08-31 DIAGNOSIS — H43813 Vitreous degeneration, bilateral: Secondary | ICD-10-CM

## 2017-08-31 DIAGNOSIS — I10 Essential (primary) hypertension: Secondary | ICD-10-CM

## 2017-10-04 DIAGNOSIS — G4733 Obstructive sleep apnea (adult) (pediatric): Secondary | ICD-10-CM | POA: Diagnosis not present

## 2017-10-05 ENCOUNTER — Encounter (INDEPENDENT_AMBULATORY_CARE_PROVIDER_SITE_OTHER): Payer: Medicare Other | Admitting: Ophthalmology

## 2017-10-05 DIAGNOSIS — I10 Essential (primary) hypertension: Secondary | ICD-10-CM | POA: Diagnosis not present

## 2017-10-05 DIAGNOSIS — E11311 Type 2 diabetes mellitus with unspecified diabetic retinopathy with macular edema: Secondary | ICD-10-CM | POA: Diagnosis not present

## 2017-10-05 DIAGNOSIS — H35033 Hypertensive retinopathy, bilateral: Secondary | ICD-10-CM

## 2017-10-05 DIAGNOSIS — H2513 Age-related nuclear cataract, bilateral: Secondary | ICD-10-CM

## 2017-10-05 DIAGNOSIS — E113211 Type 2 diabetes mellitus with mild nonproliferative diabetic retinopathy with macular edema, right eye: Secondary | ICD-10-CM

## 2017-10-05 DIAGNOSIS — E113292 Type 2 diabetes mellitus with mild nonproliferative diabetic retinopathy without macular edema, left eye: Secondary | ICD-10-CM | POA: Diagnosis not present

## 2017-10-05 DIAGNOSIS — D3132 Benign neoplasm of left choroid: Secondary | ICD-10-CM | POA: Diagnosis not present

## 2017-10-05 DIAGNOSIS — H43813 Vitreous degeneration, bilateral: Secondary | ICD-10-CM

## 2017-11-02 ENCOUNTER — Encounter (INDEPENDENT_AMBULATORY_CARE_PROVIDER_SITE_OTHER): Payer: Medicare Other | Admitting: Ophthalmology

## 2017-11-02 DIAGNOSIS — H43813 Vitreous degeneration, bilateral: Secondary | ICD-10-CM

## 2017-11-02 DIAGNOSIS — E11311 Type 2 diabetes mellitus with unspecified diabetic retinopathy with macular edema: Secondary | ICD-10-CM | POA: Diagnosis not present

## 2017-11-02 DIAGNOSIS — I10 Essential (primary) hypertension: Secondary | ICD-10-CM | POA: Diagnosis not present

## 2017-11-02 DIAGNOSIS — E113311 Type 2 diabetes mellitus with moderate nonproliferative diabetic retinopathy with macular edema, right eye: Secondary | ICD-10-CM

## 2017-11-02 DIAGNOSIS — H35033 Hypertensive retinopathy, bilateral: Secondary | ICD-10-CM

## 2017-11-02 DIAGNOSIS — E113292 Type 2 diabetes mellitus with mild nonproliferative diabetic retinopathy without macular edema, left eye: Secondary | ICD-10-CM | POA: Diagnosis not present

## 2017-11-02 DIAGNOSIS — D3132 Benign neoplasm of left choroid: Secondary | ICD-10-CM | POA: Diagnosis not present

## 2017-11-25 ENCOUNTER — Other Ambulatory Visit: Payer: Self-pay | Admitting: Cardiovascular Disease

## 2017-11-25 MED ORDER — CILOSTAZOL 100 MG PO TABS: 100.0000 mg | ORAL_TABLET | Freq: Two times a day (BID) | ORAL | 1 refills | Status: DC

## 2017-11-28 ENCOUNTER — Encounter (INDEPENDENT_AMBULATORY_CARE_PROVIDER_SITE_OTHER): Payer: Medicare Other | Admitting: Ophthalmology

## 2017-11-28 DIAGNOSIS — D3132 Benign neoplasm of left choroid: Secondary | ICD-10-CM | POA: Diagnosis not present

## 2017-11-28 DIAGNOSIS — E113292 Type 2 diabetes mellitus with mild nonproliferative diabetic retinopathy without macular edema, left eye: Secondary | ICD-10-CM

## 2017-11-28 DIAGNOSIS — H35033 Hypertensive retinopathy, bilateral: Secondary | ICD-10-CM

## 2017-11-28 DIAGNOSIS — I10 Essential (primary) hypertension: Secondary | ICD-10-CM | POA: Diagnosis not present

## 2017-11-28 DIAGNOSIS — E11311 Type 2 diabetes mellitus with unspecified diabetic retinopathy with macular edema: Secondary | ICD-10-CM | POA: Diagnosis not present

## 2017-11-28 DIAGNOSIS — E113211 Type 2 diabetes mellitus with mild nonproliferative diabetic retinopathy with macular edema, right eye: Secondary | ICD-10-CM | POA: Diagnosis not present

## 2017-11-28 DIAGNOSIS — H43813 Vitreous degeneration, bilateral: Secondary | ICD-10-CM | POA: Diagnosis not present

## 2017-12-01 ENCOUNTER — Ambulatory Visit: Payer: Medicare Other | Admitting: Family Medicine

## 2017-12-15 ENCOUNTER — Other Ambulatory Visit: Payer: Self-pay

## 2017-12-15 ENCOUNTER — Encounter: Payer: Self-pay | Admitting: Family Medicine

## 2017-12-15 ENCOUNTER — Ambulatory Visit (INDEPENDENT_AMBULATORY_CARE_PROVIDER_SITE_OTHER): Payer: Medicare Other | Admitting: Family Medicine

## 2017-12-15 VITALS — BP 124/70 | HR 64 | Temp 98.6°F | Ht 77.0 in | Wt 330.0 lb

## 2017-12-15 DIAGNOSIS — M545 Low back pain, unspecified: Secondary | ICD-10-CM

## 2017-12-15 DIAGNOSIS — E1122 Type 2 diabetes mellitus with diabetic chronic kidney disease: Secondary | ICD-10-CM | POA: Diagnosis not present

## 2017-12-15 DIAGNOSIS — I10 Essential (primary) hypertension: Secondary | ICD-10-CM

## 2017-12-15 DIAGNOSIS — G8929 Other chronic pain: Secondary | ICD-10-CM

## 2017-12-15 DIAGNOSIS — G4733 Obstructive sleep apnea (adult) (pediatric): Secondary | ICD-10-CM | POA: Diagnosis not present

## 2017-12-15 DIAGNOSIS — Z794 Long term (current) use of insulin: Secondary | ICD-10-CM

## 2017-12-15 DIAGNOSIS — N183 Chronic kidney disease, stage 3 unspecified: Secondary | ICD-10-CM

## 2017-12-15 DIAGNOSIS — E1165 Type 2 diabetes mellitus with hyperglycemia: Secondary | ICD-10-CM

## 2017-12-15 MED ORDER — FLUTICASONE PROPIONATE 50 MCG/ACT NA SUSP: 2.0000 | Freq: Every day | NASAL | 6 refills | Status: DC

## 2017-12-15 MED ORDER — BENZONATATE 100 MG PO CAPS: 100.0000 mg | ORAL_CAPSULE | Freq: Three times a day (TID) | ORAL | 0 refills | Status: DC | PRN

## 2017-12-15 MED ORDER — INSULIN DEGLUDEC 200 UNIT/ML ~~LOC~~ SOPN: 100.0000 [IU] | PEN_INJECTOR | Freq: Every day | SUBCUTANEOUS | 5 refills | Status: DC

## 2017-12-15 NOTE — Patient Instructions (Signed)
It was a pleasure to see you today! Thank you for choosing [facility] for your primary care. Adam [name] was seen for DM.   Our plans for today were:  Keep your novolog the same.  Change your lantus to tresiba 100U daily. Increase to 110 units if you sugars are >200 in the morning.   Fax the back brace request again.   Try the flonase for your cough.    Best,  Dr. Lindell Noe

## 2017-12-15 NOTE — Progress Notes (Signed)
   CC: DM, back pain  HPI Back - XR in the past, he is curious about results. Wants brace, he sent a form to try to get this through his insurance. Feels his back hurts due to caring for his wife and wife's mom. Occasionally with bad knee pain at night, resolved by mustard.  Exercise - working with wife and mom. No other exercise.   Cough - wants tessalon pearles. Worse at night, has nasal congestion w this. Compliant with CPAP. Last sleep study in 2011.   DM - sugars in the am 105-130. 120. Novolog 20 w each meal, 85U lantus BID.   Trouble staying asleep. Trouble falling back asleep. No alcohol. No caffiene in the afternoon.   ROS: denies CP, SOB, abd pain, dysuria, changes in BMs.    CC, SH/smoking status, and VS noted  Objective: BP 124/70   Pulse 64   Temp 98.6 F (37 C) (Oral)   Ht 6\' 5"  (1.956 m)   Wt (!) 149.7 kg (330 lb)   SpO2 94%   BMI 39.13 kg/m  Gen: NAD, alert, cooperative, and pleasant obese male.  HEENT: NCAT, EOMI, PERRL CV: RRR, no murmur Resp: CTAB, no wheezes, non-labored Abd: SNTND, BS present, no guarding or organomegaly Ext: No edema, warm Neuro: Alert and oriented, Speech clear, No gross deficits  Assessment and plan:  HYPERTENSION, BENIGN SYSTEMIC Well controlled. Continue current meds.   OBSTRUCTIVE SLEEP APNEA Patient reports trouble staying asleep, but is compliant with CPAP. May need updated sleep study to titrate settings.   DM (diabetes mellitus) ([place]) Check venous a1c today as our machine is down. Patient wants to switch to tresiba as his wife is doing that today as well. Switch to 100U tresiba daily, titrate up 10U for sugars >200.   BACK PAIN, LUMBAR Discussed that DDD is ultimately solved by surgery, but he is not an ideal surgical candidate medically. Continue symptomatic treatment with APAP and will be happy to rx brace once we get the form from his insurance.   Cough: question allergic component, although symptoms worst at night.  Will try fluticasone. Can try tessalon pearls for a short course.   Orders Placed This Encounter  Procedures  . Basic metabolic panel  . CBC  . Hemoglobin A1c    Meds ordered this encounter  Medications  . fluticasone (FLONASE) 50 MCG/ACT nasal spray    Sig: Place 2 sprays into both nostrils daily.    Dispense:  16 g    Refill:  6  . benzonatate (TESSALON) 100 MG capsule    Sig: Take 1 capsule (100 mg total) by mouth 3 (three) times daily as needed for cough.    Dispense:  20 capsule    Refill:  0  . Insulin Degludec (TRESIBA FLEXTOUCH) 200 UNIT/ML SOPN    Sig: Inject 100 Units into the skin daily.    Dispense:  10 pen    Refill:  5    Ralene Ok, MD, PGY2 12/17/2017 5:03 PM

## 2017-12-16 LAB — BASIC METABOLIC PANEL
BUN/Creatinine Ratio: 14 (ref 10–24)
BUN: 19 mg/dL (ref 8–27)
CO2: 19 mmol/L — ABNORMAL LOW (ref 20–29)
CREATININE: 1.32 mg/dL — AB (ref 0.76–1.27)
Calcium: 9 mg/dL (ref 8.6–10.2)
Chloride: 104 mmol/L (ref 96–106)
GFR, EST AFRICAN AMERICAN: 63 mL/min/{1.73_m2} (ref >59)
GFR, EST NON AFRICAN AMERICAN: 54 mL/min/{1.73_m2} — AB (ref >59)
Glucose: 138 mg/dL — ABNORMAL HIGH (ref 65–99)
POTASSIUM: 4.4 mmol/L (ref 3.5–5.2)
SODIUM: 139 mmol/L (ref 134–144)

## 2017-12-16 LAB — CBC
HEMOGLOBIN: 13.9 g/dL (ref 13.0–17.7)
Hematocrit: 41.2 % (ref 37.5–51.0)
MCH: 26.5 pg — AB (ref 26.6–33.0)
MCHC: 33.7 g/dL (ref 31.5–35.7)
MCV: 79 fL (ref 79–97)
Platelets: 212 10*3/uL (ref 150–379)
RBC: 5.25 x10E6/uL (ref 4.14–5.80)
RDW: 17.5 % — ABNORMAL HIGH (ref 12.3–15.4)
WBC: 6.8 10*3/uL (ref 3.4–10.8)

## 2017-12-16 LAB — HEMOGLOBIN A1C
ESTIMATED AVERAGE GLUCOSE: 157 mg/dL
HEMOGLOBIN A1C: 7.1 % — AB (ref 4.8–5.6)

## 2017-12-17 ENCOUNTER — Other Ambulatory Visit: Payer: Self-pay | Admitting: Family Medicine

## 2017-12-17 NOTE — Assessment & Plan Note (Signed)
Patient reports trouble staying asleep, but is compliant with CPAP. May need updated sleep study to titrate settings.

## 2017-12-17 NOTE — Assessment & Plan Note (Signed)
Check venous a1c today as our machine is down. Patient wants to switch to tresiba as his wife is doing that today as well. Switch to 100U tresiba daily, titrate up 10U for sugars >200.

## 2017-12-17 NOTE — Assessment & Plan Note (Signed)
Well controlled. Continue current meds

## 2017-12-17 NOTE — Assessment & Plan Note (Signed)
Discussed that DDD is ultimately solved by surgery, but he is not an ideal surgical candidate medically. Continue symptomatic treatment with APAP and will be happy to rx brace once we get the form from his insurance.

## 2017-12-20 ENCOUNTER — Other Ambulatory Visit: Payer: Self-pay | Admitting: Family Medicine

## 2017-12-20 DIAGNOSIS — I10 Essential (primary) hypertension: Secondary | ICD-10-CM

## 2017-12-26 ENCOUNTER — Encounter (INDEPENDENT_AMBULATORY_CARE_PROVIDER_SITE_OTHER): Payer: Medicare Other | Admitting: Ophthalmology

## 2017-12-26 ENCOUNTER — Telehealth: Payer: Self-pay

## 2017-12-26 DIAGNOSIS — H43813 Vitreous degeneration, bilateral: Secondary | ICD-10-CM | POA: Diagnosis not present

## 2017-12-26 DIAGNOSIS — E113292 Type 2 diabetes mellitus with mild nonproliferative diabetic retinopathy without macular edema, left eye: Secondary | ICD-10-CM | POA: Diagnosis not present

## 2017-12-26 DIAGNOSIS — H35033 Hypertensive retinopathy, bilateral: Secondary | ICD-10-CM | POA: Diagnosis not present

## 2017-12-26 DIAGNOSIS — I10 Essential (primary) hypertension: Secondary | ICD-10-CM | POA: Diagnosis not present

## 2017-12-26 DIAGNOSIS — E11311 Type 2 diabetes mellitus with unspecified diabetic retinopathy with macular edema: Secondary | ICD-10-CM | POA: Diagnosis not present

## 2017-12-26 DIAGNOSIS — E113311 Type 2 diabetes mellitus with moderate nonproliferative diabetic retinopathy with macular edema, right eye: Secondary | ICD-10-CM | POA: Diagnosis not present

## 2017-12-26 DIAGNOSIS — D3132 Benign neoplasm of left choroid: Secondary | ICD-10-CM

## 2017-12-26 DIAGNOSIS — H2513 Age-related nuclear cataract, bilateral: Secondary | ICD-10-CM

## 2017-12-26 LAB — HM DIABETES EYE EXAM

## 2017-12-26 NOTE — Telephone Encounter (Signed)
Patient called nurse line. He is going to stop [place] and go back on Lantus. States Tyler Aas is not controlling his sugar well. He is up to 100 units but still having fasting sugars of 158-178.  He does not need Lantus filled now but wants his medication list to be correct for when he needs a refill. States he uses 85 units BID. Danley Danker, RN [facility] RN)

## 2017-12-30 NOTE — Telephone Encounter (Signed)
Ok, thank you. I had instructed him that we could up titrate the [place], but it is up to him since his Lantus was working well.

## 2018-01-04 DIAGNOSIS — G4733 Obstructive sleep apnea (adult) (pediatric): Secondary | ICD-10-CM | POA: Diagnosis not present

## 2018-01-06 ENCOUNTER — Encounter: Payer: Self-pay | Admitting: Family Medicine

## 2018-01-18 ENCOUNTER — Other Ambulatory Visit: Payer: Self-pay | Admitting: Family Medicine

## 2018-01-18 DIAGNOSIS — N501 Vascular disorders of male genital organs: Secondary | ICD-10-CM | POA: Diagnosis not present

## 2018-01-18 DIAGNOSIS — Z0189 Encounter for other specified special examinations: Secondary | ICD-10-CM | POA: Diagnosis not present

## 2018-01-23 ENCOUNTER — Encounter (INDEPENDENT_AMBULATORY_CARE_PROVIDER_SITE_OTHER): Payer: Medicare Other | Admitting: Ophthalmology

## 2018-01-23 DIAGNOSIS — E11311 Type 2 diabetes mellitus with unspecified diabetic retinopathy with macular edema: Secondary | ICD-10-CM | POA: Diagnosis not present

## 2018-01-23 DIAGNOSIS — H35033 Hypertensive retinopathy, bilateral: Secondary | ICD-10-CM

## 2018-01-23 DIAGNOSIS — H43813 Vitreous degeneration, bilateral: Secondary | ICD-10-CM

## 2018-01-23 DIAGNOSIS — E113292 Type 2 diabetes mellitus with mild nonproliferative diabetic retinopathy without macular edema, left eye: Secondary | ICD-10-CM | POA: Diagnosis not present

## 2018-01-23 DIAGNOSIS — E113311 Type 2 diabetes mellitus with moderate nonproliferative diabetic retinopathy with macular edema, right eye: Secondary | ICD-10-CM

## 2018-01-23 DIAGNOSIS — D3132 Benign neoplasm of left choroid: Secondary | ICD-10-CM | POA: Diagnosis not present

## 2018-01-23 DIAGNOSIS — H2513 Age-related nuclear cataract, bilateral: Secondary | ICD-10-CM | POA: Diagnosis not present

## 2018-01-23 DIAGNOSIS — I10 Essential (primary) hypertension: Secondary | ICD-10-CM | POA: Diagnosis not present

## 2018-01-24 ENCOUNTER — Other Ambulatory Visit: Payer: Self-pay | Admitting: Cardiovascular Disease

## 2018-01-24 MED ORDER — CILOSTAZOL 100 MG PO TABS: 100.0000 mg | ORAL_TABLET | Freq: Two times a day (BID) | ORAL | 0 refills | Status: DC

## 2018-01-27 ENCOUNTER — Other Ambulatory Visit: Payer: Self-pay

## 2018-01-27 NOTE — Telephone Encounter (Signed)
Pharmacy called for refill on Rosuvastatin. Danley Danker, RN [facility] RN)

## 2018-01-31 MED ORDER — ROSUVASTATIN CALCIUM 40 MG PO TABS: 40.0000 mg | ORAL_TABLET | Freq: Every day | ORAL | 3 refills | Status: DC

## 2018-02-10 ENCOUNTER — Telehealth: Payer: Self-pay

## 2018-02-10 NOTE — Telephone Encounter (Signed)
Received fax from [facility] asking to refill Lantus U -100 Insulin 10 ml. Inject 85 Units subcutaneously twice a day. Did not see on pt med list. Ottis Stain, [place]

## 2018-02-14 ENCOUNTER — Other Ambulatory Visit: Payer: Self-pay

## 2018-02-14 ENCOUNTER — Encounter: Payer: Self-pay | Admitting: Cardiovascular Disease

## 2018-02-14 ENCOUNTER — Ambulatory Visit (INDEPENDENT_AMBULATORY_CARE_PROVIDER_SITE_OTHER): Payer: Medicare Other | Admitting: Cardiovascular Disease

## 2018-02-14 VITALS — BP 152/70 | HR 66 | Ht 77.0 in | Wt 328.0 lb

## 2018-02-14 DIAGNOSIS — I10 Essential (primary) hypertension: Secondary | ICD-10-CM | POA: Diagnosis not present

## 2018-02-14 DIAGNOSIS — I739 Peripheral vascular disease, unspecified: Secondary | ICD-10-CM | POA: Diagnosis not present

## 2018-02-14 DIAGNOSIS — E1165 Type 2 diabetes mellitus with hyperglycemia: Principal | ICD-10-CM

## 2018-02-14 DIAGNOSIS — E785 Hyperlipidemia, unspecified: Secondary | ICD-10-CM | POA: Diagnosis not present

## 2018-02-14 DIAGNOSIS — I2510 Atherosclerotic heart disease of native coronary artery without angina pectoris: Secondary | ICD-10-CM | POA: Diagnosis not present

## 2018-02-14 DIAGNOSIS — Z794 Long term (current) use of insulin: Principal | ICD-10-CM

## 2018-02-14 DIAGNOSIS — IMO0001 Reserved for inherently not codable concepts without codable children: Secondary | ICD-10-CM

## 2018-02-14 MED ORDER — "INSULIN SYRINGE 30G X 1/2"" 0.5 ML MISC": 1.0000 | 12 refills | Status: DC | PRN

## 2018-02-14 MED ORDER — INSULIN GLARGINE 100 UNIT/ML ~~LOC~~ SOLN: 85.0000 [IU] | Freq: Two times a day (BID) | SUBCUTANEOUS | 6 refills | Status: DC

## 2018-02-14 NOTE — Patient Instructions (Signed)

## 2018-02-14 NOTE — Addendum Note (Signed)
Addended by: Therese Sarah on: 02/14/2018 03:29 PM   Modules accepted: Orders
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
## 2018-02-14 NOTE — Telephone Encounter (Signed)
See phone note from 12/26/17. Danley Danker, RN [facility] RN)

## 2018-02-14 NOTE — Telephone Encounter (Signed)
Pt also needing refill of syringes.  Wallace Cullens, RN

## 2018-02-20 ENCOUNTER — Other Ambulatory Visit: Payer: Self-pay | Admitting: Family Medicine

## 2018-02-20 ENCOUNTER — Other Ambulatory Visit: Payer: Self-pay | Admitting: *Deleted

## 2018-02-20 MED ORDER — CILOSTAZOL 100 MG PO TABS: 100.0000 mg | ORAL_TABLET | Freq: Two times a day (BID) | ORAL | 11 refills | Status: DC

## 2018-02-22 DIAGNOSIS — Q829 Congenital malformation of skin, unspecified: Secondary | ICD-10-CM | POA: Diagnosis not present

## 2018-02-22 DIAGNOSIS — H25813 Combined forms of age-related cataract, bilateral: Secondary | ICD-10-CM | POA: Diagnosis not present

## 2018-02-22 DIAGNOSIS — E113213 Type 2 diabetes mellitus with mild nonproliferative diabetic retinopathy with macular edema, bilateral: Secondary | ICD-10-CM | POA: Diagnosis not present

## 2018-02-27 ENCOUNTER — Encounter (INDEPENDENT_AMBULATORY_CARE_PROVIDER_SITE_OTHER): Payer: Medicare Other | Admitting: Ophthalmology

## 2018-02-27 DIAGNOSIS — H35033 Hypertensive retinopathy, bilateral: Secondary | ICD-10-CM

## 2018-02-27 DIAGNOSIS — H43813 Vitreous degeneration, bilateral: Secondary | ICD-10-CM

## 2018-02-27 DIAGNOSIS — I10 Essential (primary) hypertension: Secondary | ICD-10-CM | POA: Diagnosis not present

## 2018-02-27 DIAGNOSIS — E113311 Type 2 diabetes mellitus with moderate nonproliferative diabetic retinopathy with macular edema, right eye: Secondary | ICD-10-CM

## 2018-02-27 DIAGNOSIS — D3132 Benign neoplasm of left choroid: Secondary | ICD-10-CM | POA: Diagnosis not present

## 2018-02-27 DIAGNOSIS — H2513 Age-related nuclear cataract, bilateral: Secondary | ICD-10-CM | POA: Diagnosis not present

## 2018-02-27 DIAGNOSIS — E113292 Type 2 diabetes mellitus with mild nonproliferative diabetic retinopathy without macular edema, left eye: Secondary | ICD-10-CM | POA: Diagnosis not present

## 2018-02-27 DIAGNOSIS — E11311 Type 2 diabetes mellitus with unspecified diabetic retinopathy with macular edema: Secondary | ICD-10-CM | POA: Diagnosis not present

## 2018-03-07 ENCOUNTER — Other Ambulatory Visit: Payer: Self-pay

## 2018-03-08 MED ORDER — INSULIN LISPRO 100 UNIT/ML (KWIKPEN): PEN_INJECTOR | SUBCUTANEOUS | 3 refills | Status: DC

## 2018-03-16 ENCOUNTER — Other Ambulatory Visit: Payer: Self-pay

## 2018-03-16 MED ORDER — ESOMEPRAZOLE MAGNESIUM 40 MG PO CPDR: 40.0000 mg | DELAYED_RELEASE_CAPSULE | Freq: Every day | ORAL | 11 refills | Status: DC

## 2018-04-03 ENCOUNTER — Encounter (INDEPENDENT_AMBULATORY_CARE_PROVIDER_SITE_OTHER): Payer: Medicare Other | Admitting: Ophthalmology

## 2018-04-03 DIAGNOSIS — E11311 Type 2 diabetes mellitus with unspecified diabetic retinopathy with macular edema: Secondary | ICD-10-CM

## 2018-04-03 DIAGNOSIS — H2513 Age-related nuclear cataract, bilateral: Secondary | ICD-10-CM

## 2018-04-03 DIAGNOSIS — H43813 Vitreous degeneration, bilateral: Secondary | ICD-10-CM | POA: Diagnosis not present

## 2018-04-03 DIAGNOSIS — E113292 Type 2 diabetes mellitus with mild nonproliferative diabetic retinopathy without macular edema, left eye: Secondary | ICD-10-CM

## 2018-04-03 DIAGNOSIS — I10 Essential (primary) hypertension: Secondary | ICD-10-CM

## 2018-04-03 DIAGNOSIS — H35033 Hypertensive retinopathy, bilateral: Secondary | ICD-10-CM

## 2018-04-03 DIAGNOSIS — E113311 Type 2 diabetes mellitus with moderate nonproliferative diabetic retinopathy with macular edema, right eye: Secondary | ICD-10-CM

## 2018-04-12 DIAGNOSIS — G4733 Obstructive sleep apnea (adult) (pediatric): Secondary | ICD-10-CM | POA: Diagnosis not present

## 2018-05-01 DIAGNOSIS — G4733 Obstructive sleep apnea (adult) (pediatric): Secondary | ICD-10-CM | POA: Diagnosis not present

## 2018-05-08 ENCOUNTER — Encounter (INDEPENDENT_AMBULATORY_CARE_PROVIDER_SITE_OTHER): Payer: Medicare Other | Admitting: Ophthalmology

## 2018-05-08 DIAGNOSIS — H2513 Age-related nuclear cataract, bilateral: Secondary | ICD-10-CM

## 2018-05-08 DIAGNOSIS — D3132 Benign neoplasm of left choroid: Secondary | ICD-10-CM | POA: Diagnosis not present

## 2018-05-08 DIAGNOSIS — E113311 Type 2 diabetes mellitus with moderate nonproliferative diabetic retinopathy with macular edema, right eye: Secondary | ICD-10-CM | POA: Diagnosis not present

## 2018-05-08 DIAGNOSIS — E113292 Type 2 diabetes mellitus with mild nonproliferative diabetic retinopathy without macular edema, left eye: Secondary | ICD-10-CM | POA: Diagnosis not present

## 2018-05-08 DIAGNOSIS — H43813 Vitreous degeneration, bilateral: Secondary | ICD-10-CM

## 2018-05-08 DIAGNOSIS — E11311 Type 2 diabetes mellitus with unspecified diabetic retinopathy with macular edema: Secondary | ICD-10-CM

## 2018-06-12 ENCOUNTER — Encounter (INDEPENDENT_AMBULATORY_CARE_PROVIDER_SITE_OTHER): Payer: Medicare Other | Admitting: Ophthalmology

## 2018-06-12 DIAGNOSIS — I10 Essential (primary) hypertension: Secondary | ICD-10-CM | POA: Diagnosis not present

## 2018-06-12 DIAGNOSIS — H35033 Hypertensive retinopathy, bilateral: Secondary | ICD-10-CM

## 2018-06-12 DIAGNOSIS — E11311 Type 2 diabetes mellitus with unspecified diabetic retinopathy with macular edema: Secondary | ICD-10-CM | POA: Diagnosis not present

## 2018-06-12 DIAGNOSIS — H43813 Vitreous degeneration, bilateral: Secondary | ICD-10-CM

## 2018-06-12 DIAGNOSIS — E113292 Type 2 diabetes mellitus with mild nonproliferative diabetic retinopathy without macular edema, left eye: Secondary | ICD-10-CM

## 2018-06-12 DIAGNOSIS — E113311 Type 2 diabetes mellitus with moderate nonproliferative diabetic retinopathy with macular edema, right eye: Secondary | ICD-10-CM

## 2018-06-12 DIAGNOSIS — D3132 Benign neoplasm of left choroid: Secondary | ICD-10-CM

## 2018-06-12 DIAGNOSIS — H2513 Age-related nuclear cataract, bilateral: Secondary | ICD-10-CM

## 2018-06-13 ENCOUNTER — Other Ambulatory Visit: Payer: Self-pay

## 2018-06-14 MED ORDER — METFORMIN HCL 500 MG PO TABS: 500.0000 mg | ORAL_TABLET | Freq: Every day | ORAL | 3 refills | Status: DC

## 2018-06-26 ENCOUNTER — Ambulatory Visit (INDEPENDENT_AMBULATORY_CARE_PROVIDER_SITE_OTHER): Payer: Medicare Other | Admitting: Family Medicine

## 2018-06-26 ENCOUNTER — Other Ambulatory Visit: Payer: Self-pay

## 2018-06-26 ENCOUNTER — Telehealth: Payer: Self-pay

## 2018-06-26 ENCOUNTER — Encounter: Payer: Self-pay | Admitting: Family Medicine

## 2018-06-26 VITALS — BP 124/68 | HR 54 | Temp 97.9°F | Ht 77.0 in | Wt 327.2 lb

## 2018-06-26 DIAGNOSIS — E1122 Type 2 diabetes mellitus with diabetic chronic kidney disease: Secondary | ICD-10-CM | POA: Diagnosis not present

## 2018-06-26 DIAGNOSIS — G4733 Obstructive sleep apnea (adult) (pediatric): Secondary | ICD-10-CM

## 2018-06-26 DIAGNOSIS — I10 Essential (primary) hypertension: Secondary | ICD-10-CM | POA: Diagnosis not present

## 2018-06-26 DIAGNOSIS — Z794 Long term (current) use of insulin: Secondary | ICD-10-CM | POA: Diagnosis not present

## 2018-06-26 DIAGNOSIS — N183 Chronic kidney disease, stage 3 unspecified: Secondary | ICD-10-CM

## 2018-06-26 DIAGNOSIS — I2510 Atherosclerotic heart disease of native coronary artery without angina pectoris: Secondary | ICD-10-CM

## 2018-06-26 LAB — POCT GLYCOSYLATED HEMOGLOBIN (HGB A1C): HbA1c, POC (controlled diabetic range): 6.5 % (ref 0.0–7.0)

## 2018-06-26 MED ORDER — DOXEPIN HCL 10 MG/ML PO CONC: 3.0000 mg | Freq: Every day | ORAL | 12 refills | Status: DC

## 2018-06-26 NOTE — Assessment & Plan Note (Signed)
Continued improving control with diet changes in additional to insulin. Using lantus 80 qAM and 85qPM. Recheck 3 months.

## 2018-06-26 NOTE — Assessment & Plan Note (Signed)
Well controlled, continue meds and recheck BMP today.

## 2018-06-26 NOTE — Telephone Encounter (Signed)
[facility] needing clarification on rx for doxepin, states is written to take 0.68ml (3mg ) QHS, wanting to verify this is dose prescribed, states its "such a low dose they need clarification" Wallace Cullens, RN

## 2018-06-26 NOTE — Assessment & Plan Note (Signed)
HR low at 54 today. Asked patient to monitor at home and if continued brady he will call and we will reduce his BB to 25 MG BID.

## 2018-06-26 NOTE — Progress Notes (Signed)
   CC: DM, sleep  HPI  DM - home RN got a1c at 6.8 last visit. Watching carbs and taking insulin. No lows. Checks CBG 3-4 times per day with sliding scale based on carb counting. 80 in am and 85 in evening. UTD on eye exam.   GERD - taking nexium daily.   Sleep - trazodone not helping, wearing CPAP nightly. Doesn't recall trying doxepin. States he hasn't slept the last 2 nights.   HTN - doesn't check HR at home, doesn't recall number from pulse ox. BPs well controlled at home, denies feelings of hypotension or low measured BPs.   Family losses: lost his brother to dementia 6/25 and has a sister in poor health as well as a close friend that has a <6 months diagnosis. Brother died at [facility] and he knows about their grief counselors, states his faith will get him through.   Got colonoscopy in 2014 w [facility], told to return in 10 years  ROS: Denies CP, SOB, abdominal pain, dysuria, changes in BMs.   CC, SH/smoking status, and VS noted  Objective: BP 124/68   Pulse (!) 54   Temp 97.9 F (36.6 C) (Oral)   Ht 6\' 5"  (1.956 m)   Wt (!) 327 lb 3.2 oz (148.4 kg)   SpO2 97%   BMI 38.80 kg/m  Gen: NAD, alert, cooperative, and pleasant. HEENT: NCAT, EOMI, PERRL CV: RRR, no murmur Resp: CTAB, no wheezes, non-labored Abd: SNTND, BS present, no guarding or organomegaly Ext: No edema, warm Neuro: Alert and oriented, Speech clear, No gross deficits  Assessment and plan:  Coronary atherosclerosis HR low at 54 today. Asked patient to monitor at home and if continued brady he will call and we will reduce his BB to 25 MG BID.   HYPERTENSION, BENIGN SYSTEMIC Well controlled, continue meds and recheck BMP today.  OBSTRUCTIVE SLEEP APNEA Compliant with CPAP, continue use.  DM (diabetes mellitus) ([place]) Continued improving control with diet changes in additional to insulin. Using lantus 80 qAM and 85qPM. Recheck 3 months.    Orders Placed This Encounter  Procedures  . CBC  . Basic  metabolic panel  . HgB A1c    Meds ordered this encounter  Medications  . doxepin (SINEQUAN) 10 MG/ML solution    Sig: Take 0.3 mLs (3 mg total) by mouth at bedtime.    Dispense:  120 mL    Refill:  12   Ralene Ok, MD, PGY3 06/26/2018 10:47 AM

## 2018-06-26 NOTE — Patient Instructions (Signed)
It was a pleasure to see you today! Thank you for choosing [facility] for your primary care. Adam [name] was seen for DM, sleep.   Our plans for today were:  Please check your heart rate on your pulse ox and call me if it stays below 60.   Let me know if your blood sugars stay below 100 and we can decrease your Lantus.   Call me in 3 days and let me know how the sleep medicine is working.   You should return to our clinic to see Dr. Eden Emms in 3 months for DM, HR.   Best,  Dr. Lindell Noe

## 2018-06-26 NOTE — Assessment & Plan Note (Signed)
Compliant with CPAP, continue use.

## 2018-06-27 LAB — CBC
Hematocrit: 44 % (ref 37.5–51.0)
Hemoglobin: 14.7 g/dL (ref 13.0–17.7)
MCH: 27 pg (ref 26.6–33.0)
MCHC: 33.4 g/dL (ref 31.5–35.7)
MCV: 81 fL (ref 79–97)
PLATELETS: 250 10*3/uL (ref 150–450)
RBC: 5.44 x10E6/uL (ref 4.14–5.80)
RDW: 16.4 % — AB (ref 12.3–15.4)
WBC: 7.6 10*3/uL (ref 3.4–10.8)

## 2018-06-27 LAB — BASIC METABOLIC PANEL
BUN/Creatinine Ratio: 10 (ref 10–24)
BUN: 12 mg/dL (ref 8–27)
CALCIUM: 9.2 mg/dL (ref 8.6–10.2)
CHLORIDE: 100 mmol/L (ref 96–106)
CO2: 24 mmol/L (ref 20–29)
Creatinine, Ser: 1.26 mg/dL (ref 0.76–1.27)
GFR calc non Af Amer: 57 mL/min/{1.73_m2} — ABNORMAL LOW (ref >59)
GFR, EST AFRICAN AMERICAN: 66 mL/min/{1.73_m2} (ref >59)
Glucose: 69 mg/dL (ref 65–99)
POTASSIUM: 4.3 mmol/L (ref 3.5–5.2)
Sodium: 140 mmol/L (ref 134–144)

## 2018-06-27 NOTE — Telephone Encounter (Signed)
Held for pharmacy for several minutes then called back and left information on prescriber voicemail. Advised to call back if there are any further questions. Danley Danker, RN [facility] RN)

## 2018-06-27 NOTE — Telephone Encounter (Signed)
Called pharmacy and waited on hold for >24min without answer. Please call and let them know that YES I meant to send 3mg  QHS as this is the starting dose for insomnia of this medication. They can also substitute generic PO doxepin tab instead of solution if it is cheaper for the patient.

## 2018-07-10 ENCOUNTER — Encounter (INDEPENDENT_AMBULATORY_CARE_PROVIDER_SITE_OTHER): Payer: Medicare Other | Admitting: Ophthalmology

## 2018-07-10 DIAGNOSIS — E113392 Type 2 diabetes mellitus with moderate nonproliferative diabetic retinopathy without macular edema, left eye: Secondary | ICD-10-CM | POA: Diagnosis not present

## 2018-07-10 DIAGNOSIS — E11311 Type 2 diabetes mellitus with unspecified diabetic retinopathy with macular edema: Secondary | ICD-10-CM

## 2018-07-10 DIAGNOSIS — H35033 Hypertensive retinopathy, bilateral: Secondary | ICD-10-CM

## 2018-07-10 DIAGNOSIS — E113311 Type 2 diabetes mellitus with moderate nonproliferative diabetic retinopathy with macular edema, right eye: Secondary | ICD-10-CM

## 2018-07-10 DIAGNOSIS — I10 Essential (primary) hypertension: Secondary | ICD-10-CM | POA: Diagnosis not present

## 2018-07-10 DIAGNOSIS — H2513 Age-related nuclear cataract, bilateral: Secondary | ICD-10-CM

## 2018-07-10 DIAGNOSIS — H43813 Vitreous degeneration, bilateral: Secondary | ICD-10-CM

## 2018-07-11 ENCOUNTER — Other Ambulatory Visit: Payer: Self-pay

## 2018-07-11 MED ORDER — INSULIN LISPRO 100 UNIT/ML (KWIKPEN): PEN_INJECTOR | SUBCUTANEOUS | 3 refills | Status: DC

## 2018-07-14 DIAGNOSIS — G4733 Obstructive sleep apnea (adult) (pediatric): Secondary | ICD-10-CM | POA: Diagnosis not present

## 2018-08-07 ENCOUNTER — Encounter (INDEPENDENT_AMBULATORY_CARE_PROVIDER_SITE_OTHER): Payer: Medicare Other | Admitting: Ophthalmology

## 2018-08-07 DIAGNOSIS — E113292 Type 2 diabetes mellitus with mild nonproliferative diabetic retinopathy without macular edema, left eye: Secondary | ICD-10-CM

## 2018-08-07 DIAGNOSIS — H2513 Age-related nuclear cataract, bilateral: Secondary | ICD-10-CM

## 2018-08-07 DIAGNOSIS — E113311 Type 2 diabetes mellitus with moderate nonproliferative diabetic retinopathy with macular edema, right eye: Secondary | ICD-10-CM

## 2018-08-07 DIAGNOSIS — E11311 Type 2 diabetes mellitus with unspecified diabetic retinopathy with macular edema: Secondary | ICD-10-CM | POA: Diagnosis not present

## 2018-08-07 DIAGNOSIS — H35033 Hypertensive retinopathy, bilateral: Secondary | ICD-10-CM

## 2018-08-07 DIAGNOSIS — I10 Essential (primary) hypertension: Secondary | ICD-10-CM | POA: Diagnosis not present

## 2018-08-18 ENCOUNTER — Other Ambulatory Visit: Payer: Self-pay

## 2018-08-18 DIAGNOSIS — E1165 Type 2 diabetes mellitus with hyperglycemia: Principal | ICD-10-CM

## 2018-08-18 DIAGNOSIS — IMO0001 Reserved for inherently not codable concepts without codable children: Secondary | ICD-10-CM

## 2018-08-18 DIAGNOSIS — Z794 Long term (current) use of insulin: Principal | ICD-10-CM

## 2018-08-21 MED ORDER — INSULIN GLARGINE 100 UNIT/ML ~~LOC~~ SOLN: 85.0000 [IU] | Freq: Two times a day (BID) | SUBCUTANEOUS | 6 refills | Status: DC

## 2018-09-11 ENCOUNTER — Encounter (INDEPENDENT_AMBULATORY_CARE_PROVIDER_SITE_OTHER): Payer: Medicare Other | Admitting: Ophthalmology

## 2018-09-11 DIAGNOSIS — E113211 Type 2 diabetes mellitus with mild nonproliferative diabetic retinopathy with macular edema, right eye: Secondary | ICD-10-CM | POA: Diagnosis not present

## 2018-09-11 DIAGNOSIS — H35033 Hypertensive retinopathy, bilateral: Secondary | ICD-10-CM | POA: Diagnosis not present

## 2018-09-11 DIAGNOSIS — E11311 Type 2 diabetes mellitus with unspecified diabetic retinopathy with macular edema: Secondary | ICD-10-CM

## 2018-09-11 DIAGNOSIS — E113292 Type 2 diabetes mellitus with mild nonproliferative diabetic retinopathy without macular edema, left eye: Secondary | ICD-10-CM

## 2018-09-11 DIAGNOSIS — I10 Essential (primary) hypertension: Secondary | ICD-10-CM

## 2018-09-11 DIAGNOSIS — D3132 Benign neoplasm of left choroid: Secondary | ICD-10-CM

## 2018-09-11 DIAGNOSIS — H43813 Vitreous degeneration, bilateral: Secondary | ICD-10-CM

## 2018-09-11 DIAGNOSIS — H2513 Age-related nuclear cataract, bilateral: Secondary | ICD-10-CM

## 2018-09-12 ENCOUNTER — Encounter: Payer: Self-pay | Admitting: Cardiovascular Disease

## 2018-09-12 ENCOUNTER — Ambulatory Visit (INDEPENDENT_AMBULATORY_CARE_PROVIDER_SITE_OTHER): Payer: Medicare Other | Admitting: Cardiovascular Disease

## 2018-09-12 VITALS — BP 138/70 | HR 60 | Ht 77.0 in | Wt 326.2 lb

## 2018-09-12 DIAGNOSIS — I739 Peripheral vascular disease, unspecified: Secondary | ICD-10-CM

## 2018-09-12 DIAGNOSIS — E785 Hyperlipidemia, unspecified: Secondary | ICD-10-CM | POA: Diagnosis not present

## 2018-09-12 DIAGNOSIS — I10 Essential (primary) hypertension: Secondary | ICD-10-CM

## 2018-09-12 DIAGNOSIS — R0602 Shortness of breath: Secondary | ICD-10-CM | POA: Diagnosis not present

## 2018-09-12 DIAGNOSIS — I2510 Atherosclerotic heart disease of native coronary artery without angina pectoris: Secondary | ICD-10-CM | POA: Diagnosis not present
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
## 2018-09-12 NOTE — Patient Instructions (Signed)
Medication Instructions:  Your physician recommends that you continue on your current medications as directed. Please refer to the Current Medication list given to you today.  If you need a refill on your cardiac medications before your next appointment, please call your pharmacy.   Lab work: none If you have labs (blood work) drawn today and your tests are completely normal, you will receive your results only by: Marland Kitchen MyChart Message (if you have MyChart) OR . A paper copy in the mail If you have any lab test that is abnormal or we need to change your treatment, we will call you to review the results.  Testing/Procedures: Your physician has requested that you have an echocardiogram. Echocardiography is a painless test that uses sound waves to create images of your heart. It provides your doctor with information about the size and shape of your heart and how well your heart's chambers and valves are working. This procedure takes approximately one hour. There are no restrictions for this procedure.    Follow-Up: At [facility], you and your health needs are our priority.  As part of our continuing mission to provide you with exceptional heart care, we have created designated Provider Care Teams.  These Care Teams include your primary Cardiologist (physician) and Advanced Practice Providers (APPs -  Physician Assistants and Nurse Practitioners) who all work together to provide you with the care you need, when you need it. You will need a follow up appointment in 6 months.  Please call our office 2 months in advance to schedule this appointment.  You may see Dr. Fletcher Anon or one of the following Advanced Practice Providers on your designated Care Team:   Kerin Ransom, PA-C Roby Lofts, [place] . Sande Rives, PA-C  Any Other Special Instructions Will Be Listed Below (If Applicable).

## 2018-09-13 ENCOUNTER — Other Ambulatory Visit: Payer: Self-pay

## 2018-09-13 DIAGNOSIS — IMO0002 Reserved for concepts with insufficient information to code with codable children: Secondary | ICD-10-CM

## 2018-09-13 DIAGNOSIS — E1165 Type 2 diabetes mellitus with hyperglycemia: Secondary | ICD-10-CM

## 2018-09-14 MED ORDER — GLUCOSE BLOOD VI STRP: ORAL_STRIP | 99 refills | Status: DC

## 2018-09-18 ENCOUNTER — Ambulatory Visit (HOSPITAL_COMMUNITY): Payer: Medicare Other | Attending: Cardiology

## 2018-09-18 ENCOUNTER — Other Ambulatory Visit: Payer: Self-pay

## 2018-09-18 DIAGNOSIS — R0602 Shortness of breath: Secondary | ICD-10-CM | POA: Diagnosis not present

## 2018-09-25 ENCOUNTER — Other Ambulatory Visit: Payer: Self-pay

## 2018-09-25 ENCOUNTER — Encounter: Payer: Self-pay | Admitting: Family Medicine

## 2018-09-25 ENCOUNTER — Ambulatory Visit (INDEPENDENT_AMBULATORY_CARE_PROVIDER_SITE_OTHER): Payer: Medicare Other | Admitting: Family Medicine

## 2018-09-25 VITALS — BP 124/62 | HR 54 | Temp 97.9°F | Ht 77.0 in | Wt 332.2 lb

## 2018-09-25 DIAGNOSIS — Z23 Encounter for immunization: Secondary | ICD-10-CM

## 2018-09-25 DIAGNOSIS — N183 Chronic kidney disease, stage 3 (moderate): Secondary | ICD-10-CM | POA: Diagnosis not present

## 2018-09-25 DIAGNOSIS — I25118 Atherosclerotic heart disease of native coronary artery with other forms of angina pectoris: Secondary | ICD-10-CM | POA: Diagnosis not present

## 2018-09-25 DIAGNOSIS — E1122 Type 2 diabetes mellitus with diabetic chronic kidney disease: Secondary | ICD-10-CM

## 2018-09-25 DIAGNOSIS — G4733 Obstructive sleep apnea (adult) (pediatric): Secondary | ICD-10-CM | POA: Diagnosis not present

## 2018-09-25 DIAGNOSIS — F4321 Adjustment disorder with depressed mood: Secondary | ICD-10-CM | POA: Diagnosis not present

## 2018-09-25 DIAGNOSIS — Z794 Long term (current) use of insulin: Secondary | ICD-10-CM

## 2018-09-25 LAB — POCT GLYCOSYLATED HEMOGLOBIN (HGB A1C): HbA1c, POC (controlled diabetic range): 6.9 % (ref 0.0–7.0)

## 2018-09-25 MED ORDER — CARVEDILOL 6.25 MG PO TABS: 6.2500 mg | ORAL_TABLET | Freq: Two times a day (BID) | ORAL | 2 refills | Status: DC

## 2018-09-25 MED ORDER — SERTRALINE HCL 50 MG PO TABS: 50.0000 mg | ORAL_TABLET | Freq: Every day | ORAL | 0 refills | Status: DC

## 2018-09-25 NOTE — Progress Notes (Signed)
CC: sleep, DM   HPI  Anxiety - states his wife and cardiologist are both concerned about this. No prev hx of mental health dx or treatment. Doesn't feel like he is consciously worrying about this or his health. Did not go to grief counseling. No SI or HI. Shows me photos of his family members today and we discussed his pleasant memories of them.   Sleep - having trouble getting to sleep at all. Just lies there all night w his eyes closed.   Took advil pm and melatonin last night and thinks he only slept for 2-3 hours. Then feels awful during the day. Was lying down with CPAP on and felt like he couldn't breathe a month or so ago. That one was the "one bad one." has small anxiety attacks every night with less SOB. Felt sweaty with the first "bad one." he discussed this "bad" episode with Dr. Fletcher Anon last week, Dr. Fletcher Anon felt it was not cardiac and likely anxiety. Checked CBG and it was ok. Not really worrying at the time. Also not feeling like doing anything. Hasn't been to counseling. Started about 3 weeks after losing his brother, this was June. Eating "too much." can't go fishing anymore because he's too tired. Grandson has bipolar. Tried the doxepin until it ran out, states no help.   DM - a1c 6.9, still using 85U BID of lantus + metformin. No lows.   Bradycardia - currently taking metoprolol BID. Dr. Fletcher Anon felt we should switch to coreg if persistent brady. He does check HR at home with BP and consistently in 50s.   OSA - wearing CPAP QHS, states he thinks he needs a new machine as it is 71 years old.   ROS: Denies CP, SOB, abdominal pain, dysuria, changes in BMs.   CC, SH/smoking status, and VS noted  Objective: BP 124/62   Pulse (!) 54   Temp 97.9 F (36.6 C) (Oral)   Ht 6\' 5"  (1.956 m)   Wt (!) 332 lb 3.2 oz (150.7 kg)   SpO2 94%   BMI 39.39 kg/m  Gen: NAD, alert, cooperative, and pleasant. HEENT: NCAT, EOMI, PERRL CV: RRR, no murmur Resp: CTAB, no wheezes,  non-labored Ext: No edema, warm Neuro: Alert and oriented, Speech clear, No gross deficits  Assessment and plan:  CAD (coronary artery disease), native coronary artery Bradycardia with metoprolol, noted Dr. Tyrell Antonio recent note to change to coreg if persistent. Patient reports persistent brady at home as well. Will change to coreg 6.25 mg BID.   DM (diabetes mellitus) ([place]) Continue current meds, could consider decreasing lantus but patient wants to stay at same dose.   OBSTRUCTIVE SLEEP APNEA Compliant, patient thinks he needs new supplies. I will place DME order and also messaged [facility].   Grief reaction Patient with sxs suggestive of grief reaction. Discussed trying trazodone for sleep, but patient states it made his legs swell in the past and didn't help him sleep. We will try sertraline 50mg  and titrate as needed. He will call me in 1 week to let me know how he is tolerating this.    Orders Placed This Encounter  Procedures  . Flu Vaccine QUAD 36+ mos IM  . HgB A1c    Meds ordered this encounter  Medications  . carvedilol (COREG) 6.25 MG tablet    Sig: Take 1 tablet (6.25 mg total) by mouth 2 (two) times daily with a meal.    Dispense:  60 tablet    Refill:  2  . sertraline (ZOLOFT) 50 MG tablet    Sig: Take 1 tablet (50 mg total) by mouth daily.    Dispense:  60 tablet    Refill:  0     Ralene Ok, MD, PGY3 09/27/2018 9:00 AM

## 2018-09-25 NOTE — Patient Instructions (Signed)
It was a pleasure to see you today! Thank you for choosing [facility] for your primary care. Adam [name] was seen for sleep.   Our plans for today were:  Have your daughter hook up the mychart so you can message me. Message me in 1-2 weeks to tell me how you are doing.   I will send a message to [facility] about the CPAPs.   Please change your heart medicine from metoprolol to carvedilol.     Best,  Dr. Lindell Noe

## 2018-09-26 ENCOUNTER — Telehealth: Payer: Self-pay

## 2018-09-26 NOTE — Telephone Encounter (Signed)
Called [facility], they did have rx without issue. Called patient and let him know, he will pick up today. Also helped him get a new text to sign up for mychart.

## 2018-09-26 NOTE — Telephone Encounter (Signed)
Pt called asking provider to send medication again. [facility] on [address] said they didn't receive the RX. Also, pt asked if Dr. Lindell Noe has gotten a form about his diabetic shoes. Please call pt and let him know when the Rx's have been resent and if form has been received. Ottis Stain, CMA

## 2018-09-27 DIAGNOSIS — I2510 Atherosclerotic heart disease of native coronary artery without angina pectoris: Secondary | ICD-10-CM | POA: Insufficient documentation

## 2018-09-27 DIAGNOSIS — F4321 Adjustment disorder with depressed mood: Secondary | ICD-10-CM | POA: Insufficient documentation

## 2018-09-27 NOTE — Assessment & Plan Note (Signed)
Compliant, patient thinks he needs new supplies. I will place DME order and also messaged [facility].

## 2018-09-27 NOTE — Assessment & Plan Note (Signed)
Patient with sxs suggestive of grief reaction. Discussed trying trazodone for sleep, but patient states it made his legs swell in the past and didn't help him sleep. We will try sertraline 50mg  and titrate as needed. He will call me in 1 week to let me know how he is tolerating this.

## 2018-09-27 NOTE — Assessment & Plan Note (Signed)
Continue current meds, could consider decreasing lantus but patient wants to stay at same dose.

## 2018-09-27 NOTE — Assessment & Plan Note (Signed)
Bradycardia with metoprolol, noted Dr. Tyrell Antonio recent note to change to coreg if persistent. Patient reports persistent brady at home as well. Will change to coreg 6.25 mg BID.

## 2018-10-06 ENCOUNTER — Telehealth: Payer: Self-pay | Admitting: *Deleted

## 2018-10-06 NOTE — Telephone Encounter (Signed)
Pt states that the liquid med for sleep (did not know name and nothing liquid in med list that I can find) is not helping.  Wants to know if Dr. Lindell Noe can call in something else.  Adam [name], Adam [name], CMA

## 2018-10-09 MED ORDER — GABAPENTIN 100 MG PO CAPS: 300.0000 mg | ORAL_CAPSULE | Freq: Every day | ORAL | 1 refills | Status: DC

## 2018-10-09 NOTE — Telephone Encounter (Signed)
Pt calling nurse line again this morning about a new sleep medication. Pt states, "I have been wide awake all weekend, please help."

## 2018-10-09 NOTE — Telephone Encounter (Signed)
Called patient regarding sleep concerns. I am happy to try trazodone again, but patient says this medicine didn't help. We discussed trying gabapentin 300mg  for sleep. He is willing to try this, I will send to the pharmacy and he will call me in a few days to discuss how this is going.

## 2018-10-16 ENCOUNTER — Encounter (INDEPENDENT_AMBULATORY_CARE_PROVIDER_SITE_OTHER): Payer: Medicare Other | Admitting: Ophthalmology

## 2018-10-16 DIAGNOSIS — E11311 Type 2 diabetes mellitus with unspecified diabetic retinopathy with macular edema: Secondary | ICD-10-CM | POA: Diagnosis not present

## 2018-10-16 DIAGNOSIS — H2513 Age-related nuclear cataract, bilateral: Secondary | ICD-10-CM

## 2018-10-16 DIAGNOSIS — E113292 Type 2 diabetes mellitus with mild nonproliferative diabetic retinopathy without macular edema, left eye: Secondary | ICD-10-CM | POA: Diagnosis not present

## 2018-10-16 DIAGNOSIS — I10 Essential (primary) hypertension: Secondary | ICD-10-CM | POA: Diagnosis not present

## 2018-10-16 DIAGNOSIS — E113311 Type 2 diabetes mellitus with moderate nonproliferative diabetic retinopathy with macular edema, right eye: Secondary | ICD-10-CM

## 2018-10-16 DIAGNOSIS — D3132 Benign neoplasm of left choroid: Secondary | ICD-10-CM

## 2018-10-16 DIAGNOSIS — H35033 Hypertensive retinopathy, bilateral: Secondary | ICD-10-CM | POA: Diagnosis not present

## 2018-10-16 DIAGNOSIS — H43813 Vitreous degeneration, bilateral: Secondary | ICD-10-CM

## 2018-10-20 DIAGNOSIS — G4733 Obstructive sleep apnea (adult) (pediatric): Secondary | ICD-10-CM | POA: Diagnosis not present

## 2018-10-23 ENCOUNTER — Telehealth: Payer: Self-pay | Admitting: Family Medicine

## 2018-10-23 DIAGNOSIS — G4733 Obstructive sleep apnea (adult) (pediatric): Secondary | ICD-10-CM

## 2018-10-23 NOTE — Telephone Encounter (Signed)
White team, when Adam [name] was here earlier last month, he wanted to talk about getting new CPAP machines and supplies for both he and Adam [name]. I have checked with [facility] and they are eligible for this, but they both need a recent visit discussing OSA and CPAP in my note. Ewin has this addressed in our last visit, but Adam [name] will need another appt to discuss OSA and her settings or we can discuss at next visit as scheduled. Will y'all let him know this? I am on nights.

## 2018-10-30 NOTE — Telephone Encounter (Signed)
Mrs. Adam [name] was informed and wants to wait till already scheduled appt in Feb. [name], [name], [name]

## 2018-11-06 ENCOUNTER — Other Ambulatory Visit: Payer: Self-pay | Admitting: Family Medicine

## 2018-11-20 ENCOUNTER — Encounter (INDEPENDENT_AMBULATORY_CARE_PROVIDER_SITE_OTHER): Payer: Medicare Other | Admitting: Ophthalmology

## 2018-11-20 DIAGNOSIS — E113292 Type 2 diabetes mellitus with mild nonproliferative diabetic retinopathy without macular edema, left eye: Secondary | ICD-10-CM | POA: Diagnosis not present

## 2018-11-20 DIAGNOSIS — H2513 Age-related nuclear cataract, bilateral: Secondary | ICD-10-CM

## 2018-11-20 DIAGNOSIS — H43813 Vitreous degeneration, bilateral: Secondary | ICD-10-CM

## 2018-11-20 DIAGNOSIS — H35033 Hypertensive retinopathy, bilateral: Secondary | ICD-10-CM

## 2018-11-20 DIAGNOSIS — D3132 Benign neoplasm of left choroid: Secondary | ICD-10-CM | POA: Diagnosis not present

## 2018-11-20 DIAGNOSIS — E11311 Type 2 diabetes mellitus with unspecified diabetic retinopathy with macular edema: Secondary | ICD-10-CM

## 2018-11-20 DIAGNOSIS — I10 Essential (primary) hypertension: Secondary | ICD-10-CM | POA: Diagnosis not present

## 2018-11-20 LAB — HM DIABETES EYE EXAM

## 2018-11-22 ENCOUNTER — Other Ambulatory Visit: Payer: Self-pay | Admitting: Family Medicine

## 2018-11-23 ENCOUNTER — Encounter: Payer: Self-pay | Admitting: Family Medicine

## 2018-12-06 ENCOUNTER — Telehealth: Payer: Self-pay | Admitting: Family Medicine

## 2018-12-06 NOTE — Telephone Encounter (Addendum)
Patient was in for his wife's visit today, he notes that he has been having low heart rate at home.  We had previously discussed this in November and were going to further decrease his Coreg if this happened.  However he forgot to call me back and let me know.  Yesterday he did not take his Coreg at all and his heart rate was in the 70s.  We discussed further decreasing to Coreg 3.125, but then the patient was not sure what color his medicines were and started seeming confused about which particular pill this was.  We were able to make an appointment tomorrow afternoon with Dr. Valentina Lucks for med review.  He also noted that the gabapentin has been helping his sleep but not fully and his wife was concerned that he was having some abnormal movements in sleep.  I reassured him this may be part of the transition from wakeful state to REM sleep.

## 2018-12-07 ENCOUNTER — Ambulatory Visit (INDEPENDENT_AMBULATORY_CARE_PROVIDER_SITE_OTHER): Payer: Medicare Other | Admitting: Pharmacist

## 2018-12-07 ENCOUNTER — Other Ambulatory Visit: Payer: Self-pay | Admitting: Family Medicine

## 2018-12-07 ENCOUNTER — Encounter: Payer: Self-pay | Admitting: Pharmacist

## 2018-12-07 VITALS — BP 136/62 | HR 56 | Wt 328.0 lb

## 2018-12-07 DIAGNOSIS — E1165 Type 2 diabetes mellitus with hyperglycemia: Secondary | ICD-10-CM | POA: Diagnosis not present

## 2018-12-07 DIAGNOSIS — E1122 Type 2 diabetes mellitus with diabetic chronic kidney disease: Secondary | ICD-10-CM | POA: Diagnosis not present

## 2018-12-07 DIAGNOSIS — I10 Essential (primary) hypertension: Secondary | ICD-10-CM | POA: Diagnosis not present

## 2018-12-07 DIAGNOSIS — Z794 Long term (current) use of insulin: Principal | ICD-10-CM

## 2018-12-07 DIAGNOSIS — I25118 Atherosclerotic heart disease of native coronary artery with other forms of angina pectoris: Secondary | ICD-10-CM

## 2018-12-07 DIAGNOSIS — IMO0001 Reserved for inherently not codable concepts without codable children: Secondary | ICD-10-CM

## 2018-12-07 DIAGNOSIS — N183 Chronic kidney disease, stage 3 (moderate): Secondary | ICD-10-CM

## 2018-12-07 MED ORDER — INSULIN GLARGINE 100 UNIT/ML ~~LOC~~ SOLN: 70.0000 [IU] | Freq: Two times a day (BID) | SUBCUTANEOUS | 6 refills | Status: DC

## 2018-12-07 MED ORDER — METFORMIN HCL ER 500 MG PO TB24: 500.0000 mg | ORAL_TABLET | Freq: Every day | ORAL | 1 refills | Status: DC

## 2018-12-07 MED ORDER — CARVEDILOL 3.125 MG PO TABS: 3.1200 mg | ORAL_TABLET | Freq: Two times a day (BID) | ORAL | 2 refills | Status: DC

## 2018-12-07 NOTE — Assessment & Plan Note (Signed)
Hypertension with bradycardia, currently controlled, but patient reporting significant fatigue with HR <60.  BP goal < 130 mmHg.  - Decrease carvedilol to 3.125 mg BID.  - Continue lisinopril 20 mg daily. Addition of SGLT2 therapy may provide BP benefit, though will monitor moving forward to see if lisinopril dose increase is needed.   Diabetes longstanding currently controlled per most recent A1c of 6.8%, though not optimally managed. D/t CKD, appropriate to consider initiation of SGLT2 therapy. Patient amenable to trialing XR metformin therapy.  - Start metformin XR 500 mg daily. Titrate moving forward.  - Start Jardiance (empagliflozin) 10 mg daily. Counseled on side effects.  - Decrease Lantus to 70 units BID to prevent hypoglycemia. Continue Humalog 10-15 units TID with meals.  -Extensively discussed pathophysiology of DM, recommended lifestyle interventions, dietary effects on glycemic control -Counseled on s/sx of and management of hypoglycemia

## 2018-12-07 NOTE — Progress Notes (Addendum)
S:     Chief Complaint  Patient presents with  . Medication Management    Diabetes    Patient arrives in good spirits, ambulating without assistance.  Presents for diabetes evaluation, education, and management at the request of Dr. Lindell Noe in phone note on 12/06/2018. At last visit with Dr. Lindell Noe, he was switched from metoprolol 25 mg BID to carvedilol 6.25 mg BID d/t bradycardia (as suggested in last visit note with Dr. Fletcher Anon). He called Dr. Estanislado Pandy on 12/06/2018 to report continued bradycardia.   Today, he brings in medications for review. Notes that HR has been in the 50s on carvedilol. Reports that it was in the 70s one morning that he skipped his evening carvedilol dose.   He also reports that he stopped taking metformin therapy because he "heard bad things". He also reports an unpleasant smell.   At last PCP visit, sertraline was initiated. He reports that he has not "felt any different" since initiation.   Insurance coverage/medication affordability: [employer] + Medicaid  Patient reports adherence with medications.  Current diabetes medications include: Lantus 80 units QAM, 85 units QPM, Humalog 10-15 units TID with meals (15 units if higher carbohydrate meals) Current hypertension medications include: lisinopril 20 mg daily, carvedilol 6.25 mg BID  Patient denies hypoglycemic events, though was concerned with a fasting blood sugar of 78 this morning.   O:  Physical Exam Constitutional:      Appearance: Normal appearance.  Neurological:     Mental Status: He is alert.     Review of Systems  All other systems reviewed and are negative.   Lab Results  Component Value Date   HGBA1C 6.9 09/25/2018   Vitals:   12/07/18 1415  BP: 136/62  Pulse: (!) 56  SpO2: 98%    Lipid Panel     Component Value Date/Time   CHOL 116 (L) 12/02/2015 1047   TRIG 88 12/02/2015 1047   HDL 32 (L) 12/02/2015 1047   CHOLHDL 3.6 12/02/2015 1047   VLDL 18  12/02/2015 1047   LDLCALC 66 12/02/2015 1047   LDLDIRECT 61 02/25/2012 1011    Home fasting CBG: all <130, many <100 2 hour post-prandial/random CBG: all <180, most 150-160s.   Clinical ASCVD: Yes    A/P: Hypertension with bradycardia, currently controlled, but patient reporting significant fatigue with HR <60.  BP goal < 130 mmHg.  - Decrease carvedilol to 3.125 mg BID.  - Continue lisinopril 20 mg daily. Addition of SGLT2 therapy may provide BP benefit, though will monitor moving forward to see if lisinopril dose increase is needed.   Diabetes longstanding currently controlled per most recent A1c of 6.8%, though not optimally managed. D/t CKD, appropriate to consider initiation of SGLT2 therapy. Patient amenable to trialing XR metformin therapy.  - Start metformin XR 500 mg daily. Titrate moving forward.  - Start Jardiance (empagliflozin) 10 mg daily. Counseled on side effects.  - Decrease Lantus to 70 units BID to prevent hypoglycemia. Continue Humalog 10-15 units TID with meals.  -Extensively discussed pathophysiology of DM, recommended lifestyle interventions, dietary effects on glycemic control -Counseled on s/sx of and management of hypoglycemia  ASCVD risk - secondary prevention in patient with DM. Last LDL is controlled.  - Continue aspirin 81 mg daily and rosuvastatin 40 mg daily    Written patient instructions provided.  Total time in face to face counseling 30 minutes.   Follow up Pharmacist Clinic Visit in 4 weeks.   Patient seen with Chrys Racer  Toula Moos, PharmD Candidate and Janae Bridgeman, PharmD, PGY-1 resident and Courtney Heys, PharmD,  PGY2 Pharmacy Resident.   New Rx sent late 2/10

## 2018-12-07 NOTE — Assessment & Plan Note (Signed)
Hypertension with bradycardia, currently controlled, but patient reporting significant fatigue with HR <60.  BP goal < 130 mmHg.  - Decrease carvedilol to 3.125 mg BID.  - Continue lisinopril 20 mg daily. Addition of SGLT2 therapy may provide BP benefit, though will monitor moving forward to see if lisinopril dose increase is needed.

## 2018-12-07 NOTE — Patient Instructions (Signed)
It was great to see you today!  We are going to make a few changes today:  1) Decrease carvedilol to 3.125 mg twice daily. You can cut the 6.25 mg tablets that you have in half until you pick up the new strength that we sent into the pharmacy 2) Start metformin XR 500 mg once daily.  3) Start Jardiance (empagliflozin) 10 mg once daily. I recommend you take this in the morning.  4) Decrease Lantus to 70 units twice daily. Continue Humalog 10-15 units with meals.   Keep checking your blood sugars 1) fasting and 2) 2 hours after meals. If you start to see your fasting sugars be consistently greater than 150 after about a week of metformin and Jardiance, give clinic a call.   Schedule follow up with [place] in about 4 weeks. Call with any questions.

## 2018-12-11 ENCOUNTER — Telehealth: Payer: Self-pay

## 2018-12-11 MED ORDER — EMPAGLIFLOZIN 10 MG PO TABS: 10.0000 mg | ORAL_TABLET | Freq: Every day | ORAL | 1 refills | Status: DC

## 2018-12-11 NOTE — Addendum Note (Signed)
Addended by: Leavy Cella on: 12/11/2018 05:26 PM   Modules accepted: Orders

## 2018-12-11 NOTE — Telephone Encounter (Signed)
Patient called to let Dr Valentina Lucks know that Adam [name] was not sent to pharmacy. He understood that it was going to be.  Danley Danker, RN [facility] RN)

## 2018-12-12 NOTE — Telephone Encounter (Signed)
I have sent his CPAP information to [facility], I will message them and see if they will reach out.

## 2018-12-12 NOTE — Progress Notes (Signed)
Patient ID: Adam [name], male   DOB: Feb 16, 1946, 73 y.o.   MRN: [id number] Reviewed: Agree with Dr. Graylin Shiver documentation and management.

## 2018-12-18 ENCOUNTER — Encounter (INDEPENDENT_AMBULATORY_CARE_PROVIDER_SITE_OTHER): Payer: Medicare Other | Admitting: Ophthalmology

## 2018-12-18 ENCOUNTER — Other Ambulatory Visit: Payer: Self-pay

## 2018-12-18 DIAGNOSIS — H2513 Age-related nuclear cataract, bilateral: Secondary | ICD-10-CM

## 2018-12-18 DIAGNOSIS — E113291 Type 2 diabetes mellitus with mild nonproliferative diabetic retinopathy without macular edema, right eye: Secondary | ICD-10-CM

## 2018-12-18 DIAGNOSIS — D3132 Benign neoplasm of left choroid: Secondary | ICD-10-CM

## 2018-12-18 DIAGNOSIS — I10 Essential (primary) hypertension: Secondary | ICD-10-CM | POA: Diagnosis not present

## 2018-12-18 DIAGNOSIS — H35033 Hypertensive retinopathy, bilateral: Secondary | ICD-10-CM

## 2018-12-18 DIAGNOSIS — H43813 Vitreous degeneration, bilateral: Secondary | ICD-10-CM

## 2018-12-18 DIAGNOSIS — E113312 Type 2 diabetes mellitus with moderate nonproliferative diabetic retinopathy with macular edema, left eye: Secondary | ICD-10-CM | POA: Diagnosis not present

## 2018-12-18 DIAGNOSIS — E11311 Type 2 diabetes mellitus with unspecified diabetic retinopathy with macular edema: Secondary | ICD-10-CM | POA: Diagnosis not present

## 2018-12-18 MED ORDER — GABAPENTIN 100 MG PO CAPS: 300.0000 mg | ORAL_CAPSULE | Freq: Every day | ORAL | 1 refills | Status: DC

## 2018-12-22 ENCOUNTER — Other Ambulatory Visit: Payer: Self-pay | Admitting: *Deleted

## 2018-12-22 DIAGNOSIS — I25118 Atherosclerotic heart disease of native coronary artery with other forms of angina pectoris: Secondary | ICD-10-CM

## 2018-12-25 MED ORDER — CARVEDILOL 3.125 MG PO TABS: 3.1200 mg | ORAL_TABLET | Freq: Two times a day (BID) | ORAL | 3 refills | Status: DC

## 2018-12-25 MED ORDER — INSULIN PEN NEEDLE 31G X 5 MM MISC: 12 refills | Status: DC

## 2018-12-26 DIAGNOSIS — G4733 Obstructive sleep apnea (adult) (pediatric): Secondary | ICD-10-CM | POA: Diagnosis not present

## 2018-12-28 ENCOUNTER — Other Ambulatory Visit: Payer: Self-pay

## 2018-12-28 DIAGNOSIS — I10 Essential (primary) hypertension: Secondary | ICD-10-CM

## 2018-12-29 MED ORDER — LISINOPRIL 20 MG PO TABS: 20.0000 mg | ORAL_TABLET | Freq: Every day | ORAL | 3 refills | Status: DC

## 2018-12-29 NOTE — Telephone Encounter (Signed)
It appears metoprolol and lisinopril refills were requested. Please let Adam [name] know I filled his lisinopril, but I did NOT fill his metoprolol because he is not supposed to be taking that anymore, only the coreg 3.125mg .

## 2018-12-29 NOTE — Telephone Encounter (Signed)
Called pt, no answer, no machine. If pt calls, please let him know the information below. Ottis Stain, CMA

## 2019-01-02 ENCOUNTER — Ambulatory Visit: Payer: Medicare Other

## 2019-01-02 MED ORDER — COLCHICINE 0.6 MG PO CAPS: 0.6000 mg | ORAL_CAPSULE | Freq: Three times a day (TID) | ORAL | 0 refills | Status: DC

## 2019-01-02 NOTE — Telephone Encounter (Signed)
Called and pt wife answered phone and asked her to let pt know medication was sent to pharmacy and that if things don't get better to come in and see PCP. Katharina Caper, Maureena Dabbs D, [place]

## 2019-01-02 NOTE — Telephone Encounter (Signed)
Contacted pt and gave him the below information and he wanted to have his gout medicine refilled.  He said that the bottle he has had a different number on it, routing to PCP for refill. Tacy Dura Rumple, April D, CMA

## 2019-01-02 NOTE — Telephone Encounter (Signed)
Will send refill on colchicine - this should be used for acute flares. Let him know we would like to see him if it is not getting better.

## 2019-01-02 NOTE — Addendum Note (Signed)
Addended by: Glenis Smoker on: 01/02/2019 04:03 PM   Modules accepted: Orders

## 2019-01-10 ENCOUNTER — Telehealth: Payer: Self-pay | Admitting: *Deleted

## 2019-01-10 DIAGNOSIS — M25511 Pain in right shoulder: Secondary | ICD-10-CM

## 2019-01-10 NOTE — Telephone Encounter (Signed)
Pt is requesting a refill on tramadol.  This is not an active med on his list but he states that his shoulder is hurting and he has had tramadol before.  Message to MD. Adam [name], Adam [name], Adam [name]

## 2019-01-10 NOTE — Telephone Encounter (Signed)
Patient has some new R shoulder pain, has flared up a few weeks ago. No known injury. No hx surgery.  He had had tramadol in the past from an ED visit, and thought he could maybe get a refill over the phone.  Of note, he is not on any chronic pain medications and he simply did not understand that this was a controlled substance.  I suggested that he either come see [place] or we send a sports medicine referral for evaluation of right shoulder pain.  He is amenable to sports medicine appointment.  Referral placed today.

## 2019-01-11 ENCOUNTER — Ambulatory Visit: Payer: Self-pay

## 2019-01-11 ENCOUNTER — Other Ambulatory Visit: Payer: Self-pay

## 2019-01-11 ENCOUNTER — Ambulatory Visit (INDEPENDENT_AMBULATORY_CARE_PROVIDER_SITE_OTHER): Payer: Medicare Other | Admitting: Sports Medicine

## 2019-01-11 ENCOUNTER — Encounter: Payer: Self-pay | Admitting: Sports Medicine

## 2019-01-11 VITALS — BP 149/65 | Ht 77.0 in | Wt 323.0 lb

## 2019-01-11 DIAGNOSIS — M25511 Pain in right shoulder: Secondary | ICD-10-CM

## 2019-01-11 MED ORDER — METHYLPREDNISOLONE ACETATE 40 MG/ML IJ SUSP
40.0000 mg | Freq: Once | INTRAMUSCULAR | Status: AC
  Administered 2019-01-11: 40 mg via INTRA_ARTICULAR

## 2019-01-11 NOTE — Patient Instructions (Signed)
You have rotator cuff tendinitis - We performed a sub-acromial steroid injection today - Do the home exercises once daily -You may use Aleve as needed for pain -Ice 15 minutes 3-4 times a day - Follow-up in 6 weeks.  If your pain is not improving we will then consider obtaining an MRI and sending you for formal physical therapy.

## 2019-01-11 NOTE — Progress Notes (Signed)
Adam [name] - 73 y.o. male MRN [id number]  Date of birth: 05-29-1946    SUBJECTIVE:      Chief Complaint: Right shoulder pain  HPI:  73 year old male with right shoulder pain for 3 weeks.  He denies any specific injury.  He notes that his pain is simply worsening.  He localizes pain to the anterior shoulder.  Does not radiate.  Is worse with movement, particularly flexion or abduction.  He denies any localized swelling, redness, or bruising.  No distal numbness or tingling.  He has not taken any medications or used any ice for his pain.  He notes increased pain at night if he lays on his right side.   ROS:     See HPI  PERTINENT  PMH / PSH FH / / SH:  Past Medical, Surgical, Social, and Family History Reviewed & Updated in the EMR.  Pertinent findings include:  Type 2 diabetes, controlled Gout  OBJECTIVE: BP (!) 149/65   Ht 6\' 5"  (1.956 m)   Wt (!) 323 lb (146.5 kg)   BMI 38.30 kg/m   Physical Exam:  Vital signs are reviewed.  GEN: Alert and oriented, NAD Pulm: Breathing unlabored PSY: normal mood, congruent affect  MSK: Right shoulder: No obvious deformity or asymmetry. No bruising. No swelling Minimal tenderness around anterior shoulder Patient is fairly limited in all planes of motion.  External rotation is near normal.  He has increased pain trying to internally rotate to his hand behind his back.  He is limited in abduction and flexion to approximately 45 degrees before developing significant pain.  Can passively abduct and flex the shoulder to about 90 degrees before significant pain. NV intact distally Special Tests:  - Impingement: Pain with [place] - Supraspinatus: Significant pain with empty can.  4/5 strength - Infraspinatus/Teres: 5/5 strength with ER - Subscapularis: negative belly press. 4+/5 strength with IR  MSK [place]: [place] right shoulder  BT short: normal appearance, no tears, trace tenosynovitis BT long:  There appears to be some mild thickening at its  proximal most portions as it enters the joint, it is intact without tears Supraspinatus tendon:  Tendon appears thickened, there are chronic hyperechoic degenerative changes appreciated, no focal tears seen subscapularis tendon: normal appearance without tears or degenerative changes Subscapularis tendon: Some mild chronic degenerative changes seen.  No acute tears Infraspinatus tendon:  normal appearance without tears or degenerative changes Teres Minor tendon:  normal appearance without tears or degenerative changes GH joint: normal appearing posterior labrum.  No obvious effusion AC joint:  Mild degenerative changes, no geyser sign  Summary and Additional findings-mild biceps tendinopathy, supraspinatus tendinopathy, and subscapularis tendinopathy.  No RTC tears appreciated      ASSESSMENT & PLAN:  1.  Right shoulder pain- secondary to impingement/rotator cuff tendinitis.  -Subacromial steroid injection performed today - Patient instructed on home rotator cuff rehab exercises - Aleve as needed - Ice - Follow-up in 6 weeks.  If he is not showing improvement, consider nitroglycerin patch, MRI and formal physical therapy   Procedure performed: subacromial corticosteroid injection; palpation guided Consent obtained and verified. Time-out conducted. Noted no overlying erythema, induration, or other signs of local infection. The  right posterior subacromial space was palpated and marked. The overlying skin was prepped in a sterile fashion. Topical analgesic spray: Ethyl chloride. Joint: Right subacromial Needle: 25GA, 1.5" Completed without difficulty. Meds: Depo-Medrol 40 mg, lidocaine 4 cc  I was the preceptor for this visit and available for immediate consultation Christia Reading  Yvetta Coder, DO

## 2019-01-15 ENCOUNTER — Encounter (INDEPENDENT_AMBULATORY_CARE_PROVIDER_SITE_OTHER): Payer: Medicare Other | Admitting: Ophthalmology

## 2019-01-15 ENCOUNTER — Other Ambulatory Visit: Payer: Self-pay

## 2019-01-15 DIAGNOSIS — H35033 Hypertensive retinopathy, bilateral: Secondary | ICD-10-CM | POA: Diagnosis not present

## 2019-01-15 DIAGNOSIS — H2513 Age-related nuclear cataract, bilateral: Secondary | ICD-10-CM

## 2019-01-15 DIAGNOSIS — E11311 Type 2 diabetes mellitus with unspecified diabetic retinopathy with macular edema: Secondary | ICD-10-CM | POA: Diagnosis not present

## 2019-01-15 DIAGNOSIS — E113292 Type 2 diabetes mellitus with mild nonproliferative diabetic retinopathy without macular edema, left eye: Secondary | ICD-10-CM | POA: Diagnosis not present

## 2019-01-15 DIAGNOSIS — H43813 Vitreous degeneration, bilateral: Secondary | ICD-10-CM

## 2019-01-15 DIAGNOSIS — I10 Essential (primary) hypertension: Secondary | ICD-10-CM | POA: Diagnosis not present

## 2019-01-15 DIAGNOSIS — E113211 Type 2 diabetes mellitus with mild nonproliferative diabetic retinopathy with macular edema, right eye: Secondary | ICD-10-CM

## 2019-01-15 DIAGNOSIS — D3132 Benign neoplasm of left choroid: Secondary | ICD-10-CM

## 2019-01-15 MED ORDER — COLCHICINE 0.6 MG PO CAPS: 0.6000 mg | ORAL_CAPSULE | Freq: Three times a day (TID) | ORAL | 0 refills | Status: DC

## 2019-01-18 ENCOUNTER — Ambulatory Visit: Payer: Medicare Other | Admitting: Pharmacist

## 2019-01-19 ENCOUNTER — Telehealth: Payer: Self-pay

## 2019-01-19 ENCOUNTER — Other Ambulatory Visit: Payer: Self-pay

## 2019-01-19 MED ORDER — SERTRALINE HCL 50 MG PO TABS: ORAL_TABLET | ORAL | 0 refills | Status: DC

## 2019-01-19 NOTE — Telephone Encounter (Signed)
Pt called nurse line stating his shoulder feels better, but know he is having bilateral knee pain and legs cramps at night. Pt stated, "I cant sleep, my knees hurt so bad, and my muscle cramps." Pt states he has been trying advil and tylenol with no relief. Pt stated he thinks his potassium is low. Pt would like for pcp to call him, and if appropriate, to schedule an apt.

## 2019-01-19 NOTE — Telephone Encounter (Signed)
Called patient back due to leg pains. He is c/w potassium being low. I don't think this is very likely, but I do think it is reasonable to get an appt for muscle aches and cramping if he has tried what he can at home. No answer when I called, it is ok with me to either put him on for this afternoon ATC if slots vs my afternoon on Monday when he calls back.

## 2019-01-22 ENCOUNTER — Other Ambulatory Visit: Payer: Self-pay

## 2019-01-22 DIAGNOSIS — R252 Cramp and spasm: Secondary | ICD-10-CM

## 2019-01-22 MED ORDER — COLCHICINE 0.6 MG PO CAPS: 0.6000 mg | ORAL_CAPSULE | Freq: Three times a day (TID) | ORAL | 0 refills | Status: DC

## 2019-01-22 NOTE — Telephone Encounter (Signed)
Contacted pt and Dr. Lindell Noe was able to talk to pt and take care of this.  Pt will come in for labs.Adam [name], CMA

## 2019-01-22 NOTE — Telephone Encounter (Signed)
Called by patient due to leg cramps. He has been trying mustard, it helps some. He has these cramps more at night. Has been waking him up at night too. He has to walk for about an hour until they go away. It also hurts in his L knee. He only wants to see me, and he would like to stay home if possible. He is ok to come in tomorrow morning to get labs drawn to check potassium, and I will call him after that.   He wanted another colchicine refill just in case for his gout, resent.

## 2019-01-23 ENCOUNTER — Other Ambulatory Visit: Payer: Self-pay

## 2019-01-23 ENCOUNTER — Other Ambulatory Visit: Payer: Medicare Other

## 2019-01-23 DIAGNOSIS — R252 Cramp and spasm: Secondary | ICD-10-CM | POA: Diagnosis not present

## 2019-01-24 DIAGNOSIS — G4733 Obstructive sleep apnea (adult) (pediatric): Secondary | ICD-10-CM | POA: Diagnosis not present

## 2019-01-24 LAB — BASIC METABOLIC PANEL
BUN/Creatinine Ratio: 16 (ref 10–24)
BUN: 24 mg/dL (ref 8–27)
CO2: 19 mmol/L — ABNORMAL LOW (ref 20–29)
Calcium: 9.1 mg/dL (ref 8.6–10.2)
Chloride: 101 mmol/L (ref 96–106)
Creatinine, Ser: 1.47 mg/dL — ABNORMAL HIGH (ref 0.76–1.27)
GFR calc Af Amer: 55 mL/min/{1.73_m2} — ABNORMAL LOW (ref >59)
GFR, EST NON AFRICAN AMERICAN: 47 mL/min/{1.73_m2} — AB (ref >59)
Glucose: 148 mg/dL — ABNORMAL HIGH (ref 65–99)
Potassium: 4.9 mmol/L (ref 3.5–5.2)
Sodium: 137 mmol/L (ref 134–144)

## 2019-01-25 ENCOUNTER — Telehealth (INDEPENDENT_AMBULATORY_CARE_PROVIDER_SITE_OTHER): Payer: Medicare Other | Admitting: Family Medicine

## 2019-01-25 ENCOUNTER — Other Ambulatory Visit: Payer: Self-pay | Admitting: *Deleted

## 2019-01-25 DIAGNOSIS — I739 Peripheral vascular disease, unspecified: Secondary | ICD-10-CM | POA: Diagnosis not present

## 2019-01-25 DIAGNOSIS — G4700 Insomnia, unspecified: Secondary | ICD-10-CM | POA: Diagnosis not present

## 2019-01-25 MED ORDER — TRAZODONE HCL 50 MG PO TABS: 25.0000 mg | ORAL_TABLET | Freq: Every evening | ORAL | 3 refills | Status: DC | PRN

## 2019-01-25 MED ORDER — TRAZODONE HCL 100 MG PO TABS: 100.0000 mg | ORAL_TABLET | Freq: Every evening | ORAL | 3 refills | Status: DC | PRN

## 2019-01-25 NOTE — Telephone Encounter (Signed)
I was preceptor the day of this visit.   

## 2019-01-25 NOTE — Assessment & Plan Note (Signed)
On further discussion, symptoms seem to be stable.  Patient initially thought he was not taking his Pletal, but was able to find it.  He does not have any any rest pain.  He is doing what sounds like a modified walking program at home.  He is already on aspirin and a statin.  We will continue to monitor this and asked him to continue walking.

## 2019-01-25 NOTE — Telephone Encounter (Signed)
[facility] Telemedicine Visit  Patient consented to have visit conducted via telephone.  Encounter participants: Patient: Adam [name]  Provider: Ralene Ok  Others (if applicable): none  Chief Complaint: sleep, leg pain   HPI:  Leg pain - he can't recall any sxs of PAD when they checked in 2013. No intervention done at that time. He can't recall taking plavix. He does recall taking aspirin. LF of cilostazol was 2/19 for #120 tabs when I called [facility] [address]. He says that both legs hurt. L sometimes more than R. Does not hurt all the time. Last cramp was in bilateral calfs 3 days ago, he was asleep. These type of cramps have been present since 2000 after his knee surgery. Lately when he walks to the trashcan or mailbox he has to sit down due to bilateral leg pain, but he doesn't think it is worse recently. He has not been walking more than normal. He is now able to find the pletal and has been taking it. He saw Dr. Donnetta Hutching in the past.   Sleep - he had some old trazodone that he has taken lately and this has helped. In the past this had caused him some leg swelling and he was hesitant to try again. He is almost out.   ROS: Denies CP, SOB, abdominal pain, dysuria, changes in BMs.   Pertinent PMHx: HTN, HLD, DMII, PAD  Assessment/Plan:  PAD (peripheral artery disease) On further discussion, symptoms seem to be stable.  Patient initially thought he was not taking his Pletal, but was able to find it.  He does not have any any rest pain.  He is doing what sounds like a modified walking program at home.  He is already on aspirin and a statin.  We will continue to monitor this and asked him to continue walking.  Insomnia Previously had some benefit from trazodone, but stopped it as he thought his foot swelling was worse.  He found some at home and has been using this for the past 3 days with good results.  He is also on an SSRI.  I will refill his  trazodone and we will continue to monitor.   Leg cramps: I suspect he is also having muscular leg cramps.  His potassium was checked and normal.  I recommended that he stretch twice per day as this has some benefit for muscle cramps.  He can continue to use mustard if he feels this helps.  Time spent on phone with patient: 11 minutes Charged for tele visit.  Ralene Ok, MD

## 2019-01-25 NOTE — Assessment & Plan Note (Signed)
Previously had some benefit from trazodone, but stopped it as he thought his foot swelling was worse.  He found some at home and has been using this for the past 3 days with good results.  He is also on an SSRI.  I will refill his trazodone and we will continue to monitor.

## 2019-01-25 NOTE — Telephone Encounter (Signed)
Pt calls because he normally gets 100mg  of trazadone.  Spoke with Dr. Lindell Noe she will call in 100mg .  Since pt has already picked up the 50mg , he will take 2 at night.  Pt would like a 90 day supply of meds. Christen Bame, CMA

## 2019-01-26 ENCOUNTER — Other Ambulatory Visit: Payer: Self-pay

## 2019-01-26 MED ORDER — ROSUVASTATIN CALCIUM 40 MG PO TABS: 40.0000 mg | ORAL_TABLET | Freq: Every day | ORAL | 3 refills | Status: DC

## 2019-02-05 ENCOUNTER — Other Ambulatory Visit: Payer: Self-pay | Admitting: Family Medicine

## 2019-02-05 DIAGNOSIS — Z794 Long term (current) use of insulin: Principal | ICD-10-CM

## 2019-02-05 DIAGNOSIS — N183 Chronic kidney disease, stage 3 unspecified: Secondary | ICD-10-CM

## 2019-02-05 DIAGNOSIS — E1122 Type 2 diabetes mellitus with diabetic chronic kidney disease: Secondary | ICD-10-CM

## 2019-02-12 ENCOUNTER — Other Ambulatory Visit: Payer: Self-pay

## 2019-02-12 ENCOUNTER — Encounter (INDEPENDENT_AMBULATORY_CARE_PROVIDER_SITE_OTHER): Payer: Medicare Other | Admitting: Ophthalmology

## 2019-02-12 MED ORDER — COLCHICINE 0.6 MG PO CAPS: 0.6000 mg | ORAL_CAPSULE | Freq: Three times a day (TID) | ORAL | 0 refills | Status: DC

## 2019-02-12 MED ORDER — TRAZODONE HCL 100 MG PO TABS: 100.0000 mg | ORAL_TABLET | Freq: Every evening | ORAL | 3 refills | Status: DC | PRN

## 2019-02-16 ENCOUNTER — Other Ambulatory Visit: Payer: Self-pay | Admitting: Cardiovascular Disease

## 2019-02-16 MED ORDER — CILOSTAZOL 100 MG PO TABS: 100.0000 mg | ORAL_TABLET | Freq: Two times a day (BID) | ORAL | 2 refills | Status: DC

## 2019-02-19 ENCOUNTER — Encounter (INDEPENDENT_AMBULATORY_CARE_PROVIDER_SITE_OTHER): Payer: Medicare Other | Admitting: Ophthalmology

## 2019-02-19 ENCOUNTER — Other Ambulatory Visit: Payer: Self-pay

## 2019-02-19 DIAGNOSIS — E113292 Type 2 diabetes mellitus with mild nonproliferative diabetic retinopathy without macular edema, left eye: Secondary | ICD-10-CM | POA: Diagnosis not present

## 2019-02-19 DIAGNOSIS — E113211 Type 2 diabetes mellitus with mild nonproliferative diabetic retinopathy with macular edema, right eye: Secondary | ICD-10-CM | POA: Diagnosis not present

## 2019-02-19 DIAGNOSIS — H2513 Age-related nuclear cataract, bilateral: Secondary | ICD-10-CM

## 2019-02-19 DIAGNOSIS — H35033 Hypertensive retinopathy, bilateral: Secondary | ICD-10-CM | POA: Diagnosis not present

## 2019-02-19 DIAGNOSIS — E11311 Type 2 diabetes mellitus with unspecified diabetic retinopathy with macular edema: Secondary | ICD-10-CM

## 2019-02-19 DIAGNOSIS — I10 Essential (primary) hypertension: Secondary | ICD-10-CM | POA: Diagnosis not present

## 2019-02-19 DIAGNOSIS — H43813 Vitreous degeneration, bilateral: Secondary | ICD-10-CM

## 2019-02-22 ENCOUNTER — Ambulatory Visit: Payer: Medicare Other | Admitting: Sports Medicine

## 2019-02-24 DIAGNOSIS — G4733 Obstructive sleep apnea (adult) (pediatric): Secondary | ICD-10-CM | POA: Diagnosis not present

## 2019-02-28 ENCOUNTER — Other Ambulatory Visit: Payer: Self-pay | Admitting: *Deleted

## 2019-03-02 DIAGNOSIS — G4733 Obstructive sleep apnea (adult) (pediatric): Secondary | ICD-10-CM | POA: Diagnosis not present

## 2019-03-02 MED ORDER — COLCHICINE 0.6 MG PO CAPS: 0.6000 mg | ORAL_CAPSULE | Freq: Three times a day (TID) | ORAL | 0 refills | Status: DC

## 2019-03-05 ENCOUNTER — Other Ambulatory Visit: Payer: Self-pay | Admitting: Family Medicine

## 2019-03-05 DIAGNOSIS — IMO0001 Reserved for inherently not codable concepts without codable children: Secondary | ICD-10-CM

## 2019-03-05 DIAGNOSIS — E1165 Type 2 diabetes mellitus with hyperglycemia: Principal | ICD-10-CM

## 2019-03-05 DIAGNOSIS — Z794 Long term (current) use of insulin: Principal | ICD-10-CM

## 2019-03-06 ENCOUNTER — Other Ambulatory Visit: Payer: Self-pay

## 2019-03-06 ENCOUNTER — Other Ambulatory Visit: Payer: Self-pay | Admitting: Family Medicine

## 2019-03-06 DIAGNOSIS — I25118 Atherosclerotic heart disease of native coronary artery with other forms of angina pectoris: Secondary | ICD-10-CM

## 2019-03-08 MED ORDER — ESOMEPRAZOLE MAGNESIUM 40 MG PO CPDR: 40.0000 mg | DELAYED_RELEASE_CAPSULE | Freq: Every day | ORAL | 11 refills | Status: DC

## 2019-03-08 MED ORDER — CARVEDILOL 3.125 MG PO TABS: 3.1250 mg | ORAL_TABLET | Freq: Two times a day (BID) | ORAL | 1 refills | Status: DC

## 2019-03-08 NOTE — Addendum Note (Signed)
Addended by: Dorna Bloom on: 03/08/2019 11:53 AM   Modules accepted: Orders

## 2019-03-08 NOTE — Telephone Encounter (Signed)
Requesting a 90 day supply.

## 2019-03-09 ENCOUNTER — Other Ambulatory Visit: Payer: Self-pay

## 2019-03-09 NOTE — Telephone Encounter (Signed)
Please clarify, just filled this last week

## 2019-03-12 ENCOUNTER — Other Ambulatory Visit: Payer: Self-pay | Admitting: *Deleted

## 2019-03-13 ENCOUNTER — Other Ambulatory Visit: Payer: Self-pay | Admitting: *Deleted

## 2019-03-13 DIAGNOSIS — E1122 Type 2 diabetes mellitus with diabetic chronic kidney disease: Secondary | ICD-10-CM

## 2019-03-13 DIAGNOSIS — N183 Chronic kidney disease, stage 3 unspecified: Secondary | ICD-10-CM

## 2019-03-13 MED ORDER — EMPAGLIFLOZIN 10 MG PO TABS: 10.0000 mg | ORAL_TABLET | Freq: Every day | ORAL | 3 refills | Status: DC

## 2019-03-13 MED ORDER — SERTRALINE HCL 50 MG PO TABS: ORAL_TABLET | ORAL | 3 refills | Status: DC

## 2019-03-13 NOTE — Telephone Encounter (Signed)
Pt received a letter from insurance requesting that these meds be called in for a 90 day supply.   Will forward to MD. Christen Bame, CMA

## 2019-03-26 DIAGNOSIS — G4733 Obstructive sleep apnea (adult) (pediatric): Secondary | ICD-10-CM | POA: Diagnosis not present

## 2019-03-27 ENCOUNTER — Other Ambulatory Visit: Payer: Self-pay

## 2019-03-27 DIAGNOSIS — IMO0001 Reserved for inherently not codable concepts without codable children: Secondary | ICD-10-CM

## 2019-03-28 MED ORDER — INSULIN GLARGINE 100 UNIT/ML ~~LOC~~ SOLN: 70.0000 [IU] | Freq: Two times a day (BID) | SUBCUTANEOUS | 6 refills | Status: DC

## 2019-04-02 ENCOUNTER — Other Ambulatory Visit: Payer: Self-pay | Admitting: *Deleted

## 2019-04-02 ENCOUNTER — Other Ambulatory Visit: Payer: Self-pay

## 2019-04-02 ENCOUNTER — Encounter (INDEPENDENT_AMBULATORY_CARE_PROVIDER_SITE_OTHER): Payer: Medicare Other | Admitting: Ophthalmology

## 2019-04-02 DIAGNOSIS — E113311 Type 2 diabetes mellitus with moderate nonproliferative diabetic retinopathy with macular edema, right eye: Secondary | ICD-10-CM

## 2019-04-02 DIAGNOSIS — H35033 Hypertensive retinopathy, bilateral: Secondary | ICD-10-CM | POA: Diagnosis not present

## 2019-04-02 DIAGNOSIS — E113292 Type 2 diabetes mellitus with mild nonproliferative diabetic retinopathy without macular edema, left eye: Secondary | ICD-10-CM

## 2019-04-02 DIAGNOSIS — E11311 Type 2 diabetes mellitus with unspecified diabetic retinopathy with macular edema: Secondary | ICD-10-CM | POA: Diagnosis not present

## 2019-04-02 DIAGNOSIS — IMO0001 Reserved for inherently not codable concepts without codable children: Secondary | ICD-10-CM

## 2019-04-02 DIAGNOSIS — H2513 Age-related nuclear cataract, bilateral: Secondary | ICD-10-CM

## 2019-04-02 DIAGNOSIS — I10 Essential (primary) hypertension: Secondary | ICD-10-CM

## 2019-04-02 DIAGNOSIS — H43813 Vitreous degeneration, bilateral: Secondary | ICD-10-CM

## 2019-04-02 DIAGNOSIS — E1165 Type 2 diabetes mellitus with hyperglycemia: Secondary | ICD-10-CM

## 2019-04-02 DIAGNOSIS — D3132 Benign neoplasm of left choroid: Secondary | ICD-10-CM

## 2019-04-02 MED ORDER — INSULIN GLARGINE 100 UNIT/ML ~~LOC~~ SOLN: 70.0000 [IU] | Freq: Two times a day (BID) | SUBCUTANEOUS | 6 refills | Status: DC

## 2019-04-02 MED ORDER — "INSULIN SYRINGE 30G X 1/2"" 0.5 ML MISC": 1.0000 | 12 refills | Status: DC | PRN

## 2019-04-02 NOTE — Progress Notes (Signed)
Meds were set to no print.  Pt called to check status.  Resent and pt informed. Christen Bame, CMA

## 2019-04-03 ENCOUNTER — Telehealth (INDEPENDENT_AMBULATORY_CARE_PROVIDER_SITE_OTHER): Payer: Medicare Other | Admitting: Cardiovascular Disease

## 2019-04-03 ENCOUNTER — Encounter: Payer: Self-pay | Admitting: Cardiovascular Disease

## 2019-04-03 VITALS — BP 137/73 | HR 61 | Ht 77.0 in | Wt 324.0 lb

## 2019-04-03 DIAGNOSIS — I2510 Atherosclerotic heart disease of native coronary artery without angina pectoris: Secondary | ICD-10-CM

## 2019-04-03 DIAGNOSIS — I739 Peripheral vascular disease, unspecified: Secondary | ICD-10-CM | POA: Diagnosis not present

## 2019-04-03 NOTE — Patient Instructions (Signed)
Medication Instructions:  Continue same medications If you need a refill on your cardiac medications before your next appointment, please call your pharmacy.   Lab work: None If you have labs (blood work) drawn today and your tests are completely normal, you will receive your results only by: Marland Kitchen MyChart Message (if you have MyChart) OR . A paper copy in the mail If you have any lab test that is abnormal or we need to change your treatment, we will call you to review the results.  Testing/Procedures: None  Follow-Up: Follow-up with Dr. Fletcher Anon in 6 months
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
## 2019-04-26 DIAGNOSIS — G4733 Obstructive sleep apnea (adult) (pediatric): Secondary | ICD-10-CM | POA: Diagnosis not present

## 2019-04-30 ENCOUNTER — Other Ambulatory Visit: Payer: Self-pay | Admitting: *Deleted

## 2019-04-30 DIAGNOSIS — IMO0001 Reserved for inherently not codable concepts without codable children: Secondary | ICD-10-CM

## 2019-04-30 MED ORDER — INSULIN LISPRO (1 UNIT DIAL) 100 UNIT/ML (KWIKPEN): PEN_INJECTOR | SUBCUTANEOUS | 2 refills | Status: DC

## 2019-04-30 MED ORDER — INSULIN GLARGINE 100 UNIT/ML ~~LOC~~ SOLN: 70.0000 [IU] | Freq: Two times a day (BID) | SUBCUTANEOUS | 6 refills | Status: DC

## 2019-04-30 NOTE — Telephone Encounter (Signed)
Pt calls because he needs refills on 2 meds:  1. Lantus: which had refills sent on 04/02/19.  Attempted to call pharmacy but they are not open yet.  2. humalog  To MD. Christen Bame, CMA

## 2019-04-30 NOTE — Telephone Encounter (Signed)
Pt calling to check status. Kya Mayfield, CMA  

## 2019-05-07 ENCOUNTER — Other Ambulatory Visit: Payer: Self-pay

## 2019-05-07 ENCOUNTER — Encounter (INDEPENDENT_AMBULATORY_CARE_PROVIDER_SITE_OTHER): Payer: Medicare Other | Admitting: Ophthalmology

## 2019-05-07 DIAGNOSIS — I10 Essential (primary) hypertension: Secondary | ICD-10-CM | POA: Diagnosis not present

## 2019-05-07 DIAGNOSIS — H35033 Hypertensive retinopathy, bilateral: Secondary | ICD-10-CM | POA: Diagnosis not present

## 2019-05-07 DIAGNOSIS — E113311 Type 2 diabetes mellitus with moderate nonproliferative diabetic retinopathy with macular edema, right eye: Secondary | ICD-10-CM

## 2019-05-07 DIAGNOSIS — H2513 Age-related nuclear cataract, bilateral: Secondary | ICD-10-CM

## 2019-05-07 DIAGNOSIS — E11311 Type 2 diabetes mellitus with unspecified diabetic retinopathy with macular edema: Secondary | ICD-10-CM

## 2019-05-07 DIAGNOSIS — E113292 Type 2 diabetes mellitus with mild nonproliferative diabetic retinopathy without macular edema, left eye: Secondary | ICD-10-CM

## 2019-05-07 DIAGNOSIS — H43813 Vitreous degeneration, bilateral: Secondary | ICD-10-CM

## 2019-05-17 ENCOUNTER — Telehealth: Payer: Self-pay | Admitting: Family Medicine

## 2019-05-17 DIAGNOSIS — Z20822 Contact with and (suspected) exposure to covid-19: Secondary | ICD-10-CM

## 2019-05-17 NOTE — Telephone Encounter (Signed)
[place]  Patient's wife having symptoms of cough and shortness of breath and is requesting for her husband to be tested for COVID when she goes to get her test done.  Order placed for patient to have coded testing done.  He is asymptomatic at this time.  Counseled on social distancing, wearing a mask and appropriate handwashing.  Martinique Detron Carras, DO PGY-3, [facility]

## 2019-05-18 ENCOUNTER — Other Ambulatory Visit: Payer: Self-pay | Admitting: Internal Medicine

## 2019-05-18 DIAGNOSIS — Z20822 Contact with and (suspected) exposure to covid-19: Secondary | ICD-10-CM

## 2019-05-18 DIAGNOSIS — R6889 Other general symptoms and signs: Secondary | ICD-10-CM | POA: Diagnosis not present

## 2019-05-18 MED ORDER — TRAZODONE HCL 100 MG PO TABS: 100.0000 mg | ORAL_TABLET | Freq: Every evening | ORAL | 0 refills | Status: DC | PRN

## 2019-05-20 LAB — NOVEL CORONAVIRUS, NAA: SARS-CoV-2, NAA: NOT DETECTED

## 2019-05-26 DIAGNOSIS — G4733 Obstructive sleep apnea (adult) (pediatric): Secondary | ICD-10-CM | POA: Diagnosis not present

## 2019-05-28 DIAGNOSIS — Z794 Long term (current) use of insulin: Secondary | ICD-10-CM | POA: Diagnosis not present

## 2019-05-28 DIAGNOSIS — H25813 Combined forms of age-related cataract, bilateral: Secondary | ICD-10-CM | POA: Diagnosis not present

## 2019-05-28 DIAGNOSIS — E113293 Type 2 diabetes mellitus with mild nonproliferative diabetic retinopathy without macular edema, bilateral: Secondary | ICD-10-CM | POA: Diagnosis not present

## 2019-05-28 DIAGNOSIS — H43813 Vitreous degeneration, bilateral: Secondary | ICD-10-CM | POA: Diagnosis not present

## 2019-05-31 DIAGNOSIS — G4733 Obstructive sleep apnea (adult) (pediatric): Secondary | ICD-10-CM | POA: Diagnosis not present

## 2019-06-05 ENCOUNTER — Telehealth: Payer: Self-pay | Admitting: *Deleted

## 2019-06-05 DIAGNOSIS — IMO0001 Reserved for inherently not codable concepts without codable children: Secondary | ICD-10-CM

## 2019-06-05 NOTE — Telephone Encounter (Signed)
Pt states that his [place] practitioner increased his lantus to 85units BID because his A1c went from 6.6 - 7.6.  He is now out of medication and needs a new script sent to pharmacy in a 3 month supply.  Will forward to MD.  Christen Bame, CMA

## 2019-06-10 MED ORDER — INSULIN GLARGINE 100 UNIT/ML ~~LOC~~ SOLN: 85.0000 [IU] | Freq: Two times a day (BID) | SUBCUTANEOUS | 6 refills | Status: DC

## 2019-06-10 NOTE — Telephone Encounter (Signed)
Prescription sent to pharmacy, recommend reviewing AM BG as risk of hypoglycemia with tight control.  Dorris Singh, MD  Family Medicine Teaching Service

## 2019-06-11 ENCOUNTER — Encounter (INDEPENDENT_AMBULATORY_CARE_PROVIDER_SITE_OTHER): Payer: Medicare Other | Admitting: Ophthalmology

## 2019-06-11 ENCOUNTER — Other Ambulatory Visit: Payer: Self-pay

## 2019-06-11 DIAGNOSIS — E113311 Type 2 diabetes mellitus with moderate nonproliferative diabetic retinopathy with macular edema, right eye: Secondary | ICD-10-CM | POA: Diagnosis not present

## 2019-06-11 DIAGNOSIS — E11311 Type 2 diabetes mellitus with unspecified diabetic retinopathy with macular edema: Secondary | ICD-10-CM

## 2019-06-11 DIAGNOSIS — E113292 Type 2 diabetes mellitus with mild nonproliferative diabetic retinopathy without macular edema, left eye: Secondary | ICD-10-CM

## 2019-06-11 DIAGNOSIS — D3131 Benign neoplasm of right choroid: Secondary | ICD-10-CM

## 2019-06-11 DIAGNOSIS — I10 Essential (primary) hypertension: Secondary | ICD-10-CM | POA: Diagnosis not present

## 2019-06-11 DIAGNOSIS — H35033 Hypertensive retinopathy, bilateral: Secondary | ICD-10-CM

## 2019-06-11 DIAGNOSIS — H2513 Age-related nuclear cataract, bilateral: Secondary | ICD-10-CM

## 2019-06-11 DIAGNOSIS — H43813 Vitreous degeneration, bilateral: Secondary | ICD-10-CM

## 2019-06-19 ENCOUNTER — Ambulatory Visit (INDEPENDENT_AMBULATORY_CARE_PROVIDER_SITE_OTHER): Payer: Medicare Other | Admitting: Family Medicine

## 2019-06-19 ENCOUNTER — Other Ambulatory Visit: Payer: Self-pay

## 2019-06-19 ENCOUNTER — Encounter: Payer: Self-pay | Admitting: Family Medicine

## 2019-06-19 VITALS — BP 136/60 | HR 66 | Wt 325.8 lb

## 2019-06-19 DIAGNOSIS — N183 Chronic kidney disease, stage 3 unspecified: Secondary | ICD-10-CM

## 2019-06-19 DIAGNOSIS — Z794 Long term (current) use of insulin: Secondary | ICD-10-CM | POA: Diagnosis not present

## 2019-06-19 DIAGNOSIS — R252 Cramp and spasm: Secondary | ICD-10-CM | POA: Diagnosis not present

## 2019-06-19 DIAGNOSIS — E1122 Type 2 diabetes mellitus with diabetic chronic kidney disease: Secondary | ICD-10-CM | POA: Diagnosis not present

## 2019-06-19 LAB — POCT GLYCOSYLATED HEMOGLOBIN (HGB A1C): HbA1c, POC (controlled diabetic range): 7.1 % — AB (ref 0.0–7.0)

## 2019-06-19 MED ORDER — GABAPENTIN 100 MG PO CAPS: 300.0000 mg | ORAL_CAPSULE | Freq: Three times a day (TID) | ORAL | 2 refills | Status: DC

## 2019-06-19 NOTE — Patient Instructions (Addendum)
Mr Adam [name],  It was nice to see you in clinic today.  Today we addressed your raised sugar levels. Please take your insulin regularly and continue to improve your diet. This will help your sugars. Please see me again in 3 months time.  I have prescribed you higher doses of gabapetin, this will help with your leg cramps.   Kind regards Dr Lattie Haw PGY1

## 2019-06-20 DIAGNOSIS — R252 Cramp and spasm: Secondary | ICD-10-CM | POA: Insufficient documentation

## 2019-06-20 NOTE — Assessment & Plan Note (Signed)
Poor controlled diabetes  HbA1c today 7.1 today, increased from previous documented 6.9 in Nov 19. Pt has not been taking Humalog with all his meals-carbohydrate or sugar rich meals only. May explain increase in HbA1c. Pt admits to recent poor diet. Provided counselling on important of eating diet rich in complex carbohydrates such as sweet potato, brown rice and brown wheat, vegetables and certain fruits such as berries. Should also eat consume fish and eggs if possible rather than red meats. Pt agrees with this plan. Counseled patient to take Humalog with all meals and titrate accordingly. Did not get time to address diabetic foot exam on this visit. Will do so next time. Would like to see patient for diabetic check in 3 months again.

## 2019-06-20 NOTE — Assessment & Plan Note (Signed)
Bilateral leg cramps Last electrolyte check-K was normal so unlikely cause. Increased Gabapentin 300mg  daily to 300mg  am, 300mg  afternoon and 600mg  pm.

## 2019-06-20 NOTE — Progress Notes (Signed)
   Subjective:    Patient ID: Adam [name], male    DOB: [birth year], 73 y.o.   MRN: [id number]   CC: Adam [name] is an 73 yr old male who has come in for diabetic review and to meet new PCP.    HPI:   Diabetes Pt says his HbA1cs have not been as well controlled as he'd like over the last year. HbA1c in Feb 20 was 7.6 with nurse check. Since this has been eating a more healthy diet at home. Prior to this it has been 6.5 in Aug 19.  He reports taking his Lantus regularly 85 Units BID and takes the Humalog between 20-50 units with carbohydrate rich meals only.  Denies chest pain, SOB, palpitations, abdo pain, no urinary sx or change in bowel habit.  Denies changes in vision or headaches.  Leg cramps Reports bilateral leg cramps for the past month a few times a week at night which are problematic when sleeping.    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  Smoking status reviewed   ROS: pertinent noted in the HPI   Past medical history, surgical, family, and social history reviewed and updated in the EMR as appropriate. Reviewed problem list.   Objective:  BP 136/60   Pulse 66   Wt (!) 147.8 kg   SpO2 94%   BMI 38.63 kg/m   Vitals and nursing note reviewed  General: NAD, pleasant, able to participate in exam. Increased body habitus  Cardiac: RRR, S1 S2 present. normal heart sounds, no murmurs. Respiratory: CTAB, normal effort, No wheezes, rales or rhonchi Extremities: no edema or cyanosis. Skin: warm and dry, no rashes noted Neuro: alert, no obvious focal deficits Psych: Normal affect and mood   Assessment & Plan:    DM (diabetes mellitus) ([place]) Poor controlled diabetes  HbA1c today 7.1 today, increased from previous documented 6.9 in Nov 19. Pt has not been taking Humalog with all his meals-carbohydrate or sugar rich meals only. May explain increase in HbA1c. Pt admits to recent poor diet. Provided counselling on important of  eating diet rich in complex carbohydrates such as sweet potato, brown rice and brown wheat, vegetables and certain fruits such as berries. Should also eat consume fish and eggs if possible rather than red meats. Pt agrees with this plan. Counseled patient to take Humalog with all meals and titrate accordingly. Did not get time to address diabetic foot exam on this visit. Will do so next time. Would like to see patient for diabetic check in 3 months again.   Leg cramps Bilateral leg cramps Last electrolyte check-K was normal so unlikely cause. Increased Gabapentin 300mg  daily to 300mg  am, 300mg  afternoon and 600mg  pm.    Lattie Haw, MD  [place] PGY-1

## 2019-06-26 DIAGNOSIS — G4733 Obstructive sleep apnea (adult) (pediatric): Secondary | ICD-10-CM | POA: Diagnosis not present

## 2019-07-03 ENCOUNTER — Other Ambulatory Visit: Payer: Self-pay

## 2019-07-03 DIAGNOSIS — E1165 Type 2 diabetes mellitus with hyperglycemia: Secondary | ICD-10-CM

## 2019-07-04 MED ORDER — "INSULIN SYRINGE 30G X 1/2"" 0.5 ML MISC": 1.0000 | 12 refills | Status: DC | PRN

## 2019-07-10 ENCOUNTER — Telehealth: Payer: Self-pay

## 2019-07-10 NOTE — Telephone Encounter (Signed)
Patient calls nurse line stating the wrong syringes were called in. Patient needs 1mg  31gx5/16 UF. Patient also needs pen needles for humalog. Please advise.

## 2019-07-12 ENCOUNTER — Other Ambulatory Visit: Payer: Self-pay | Admitting: Family Medicine

## 2019-07-12 MED ORDER — "INSULIN SYRINGE 31G X 5/16"" 1 ML MISC": 1.0000 | Freq: Four times a day (QID) | 99 refills | Status: DC

## 2019-07-12 NOTE — Telephone Encounter (Signed)
Hi I have sent these supplies in. Please let me know if there are any issues. Thank you

## 2019-07-16 ENCOUNTER — Telehealth: Payer: Self-pay | Admitting: *Deleted

## 2019-07-16 ENCOUNTER — Encounter (INDEPENDENT_AMBULATORY_CARE_PROVIDER_SITE_OTHER): Payer: Medicare Other | Admitting: Ophthalmology

## 2019-07-16 ENCOUNTER — Other Ambulatory Visit: Payer: Self-pay

## 2019-07-16 DIAGNOSIS — D3132 Benign neoplasm of left choroid: Secondary | ICD-10-CM

## 2019-07-16 DIAGNOSIS — H35033 Hypertensive retinopathy, bilateral: Secondary | ICD-10-CM | POA: Diagnosis not present

## 2019-07-16 DIAGNOSIS — E113311 Type 2 diabetes mellitus with moderate nonproliferative diabetic retinopathy with macular edema, right eye: Secondary | ICD-10-CM

## 2019-07-16 DIAGNOSIS — E113292 Type 2 diabetes mellitus with mild nonproliferative diabetic retinopathy without macular edema, left eye: Secondary | ICD-10-CM

## 2019-07-16 DIAGNOSIS — H2513 Age-related nuclear cataract, bilateral: Secondary | ICD-10-CM

## 2019-07-16 DIAGNOSIS — I10 Essential (primary) hypertension: Secondary | ICD-10-CM

## 2019-07-16 DIAGNOSIS — E11311 Type 2 diabetes mellitus with unspecified diabetic retinopathy with macular edema: Secondary | ICD-10-CM

## 2019-07-16 DIAGNOSIS — H43813 Vitreous degeneration, bilateral: Secondary | ICD-10-CM

## 2019-07-16 NOTE — Telephone Encounter (Signed)
-----   Message from Lattie Haw, MD sent at 07/15/2019 11:01 PM EDT ----- Hello [facility] team!  I would like to change Mansel's Cilostazol dose from 100mg  BID to 50mg  BID. What is the best way of doing this?   Thank you,  [place]

## 2019-07-17 NOTE — Telephone Encounter (Signed)
You can do an orders only encounter and do a new Rx for the new dose. Adam [name], CMA

## 2019-07-27 DIAGNOSIS — G4733 Obstructive sleep apnea (adult) (pediatric): Secondary | ICD-10-CM | POA: Diagnosis not present

## 2019-08-01 ENCOUNTER — Other Ambulatory Visit: Payer: Self-pay | Admitting: Family Medicine

## 2019-08-01 MED ORDER — CILOSTAZOL 100 MG PO TABS: 50.0000 mg | ORAL_TABLET | Freq: Two times a day (BID) | ORAL | 2 refills | Status: DC

## 2019-08-13 ENCOUNTER — Encounter (INDEPENDENT_AMBULATORY_CARE_PROVIDER_SITE_OTHER): Payer: Medicare Other | Admitting: Ophthalmology

## 2019-08-15 ENCOUNTER — Telehealth: Payer: Self-pay | Admitting: Family Medicine

## 2019-08-15 NOTE — Telephone Encounter (Incomplete)
Application for Handicapped Drivers Registration Plate  form dropped off for at front desk for completion.  Verified that patient section of form has been completed.  Last08/18/20 with PCP was ***.  Placed form in team folder to be completed by clinical staff.  Grayce Corie Chiquito

## 2019-08-16 NOTE — Telephone Encounter (Signed)
Clinical info completed on Handicapped Plaque form.  Place form in Dr. Ena Dawley box for completion.  Ottis Stain, CMA

## 2019-08-20 ENCOUNTER — Other Ambulatory Visit: Payer: Self-pay

## 2019-08-20 ENCOUNTER — Encounter (INDEPENDENT_AMBULATORY_CARE_PROVIDER_SITE_OTHER): Payer: Medicare Other | Admitting: Ophthalmology

## 2019-08-20 DIAGNOSIS — E113292 Type 2 diabetes mellitus with mild nonproliferative diabetic retinopathy without macular edema, left eye: Secondary | ICD-10-CM

## 2019-08-20 DIAGNOSIS — H43813 Vitreous degeneration, bilateral: Secondary | ICD-10-CM

## 2019-08-20 DIAGNOSIS — E11311 Type 2 diabetes mellitus with unspecified diabetic retinopathy with macular edema: Secondary | ICD-10-CM | POA: Diagnosis not present

## 2019-08-20 DIAGNOSIS — E113311 Type 2 diabetes mellitus with moderate nonproliferative diabetic retinopathy with macular edema, right eye: Secondary | ICD-10-CM | POA: Diagnosis not present

## 2019-08-20 DIAGNOSIS — H35033 Hypertensive retinopathy, bilateral: Secondary | ICD-10-CM | POA: Diagnosis not present

## 2019-08-20 DIAGNOSIS — I10 Essential (primary) hypertension: Secondary | ICD-10-CM | POA: Diagnosis not present

## 2019-08-20 DIAGNOSIS — D3132 Benign neoplasm of left choroid: Secondary | ICD-10-CM

## 2019-08-20 DIAGNOSIS — H2513 Age-related nuclear cataract, bilateral: Secondary | ICD-10-CM

## 2019-08-21 NOTE — Telephone Encounter (Signed)
Pt calling to check status. Jessica Fleeger, CMA  

## 2019-08-22 NOTE — Telephone Encounter (Signed)
The form should be at the front desk :)

## 2019-08-23 NOTE — Telephone Encounter (Signed)
LMOVM informing pt...............................Adam [name], CMA  

## 2019-08-26 DIAGNOSIS — G4733 Obstructive sleep apnea (adult) (pediatric): Secondary | ICD-10-CM | POA: Diagnosis not present

## 2019-08-30 DIAGNOSIS — G4733 Obstructive sleep apnea (adult) (pediatric): Secondary | ICD-10-CM | POA: Diagnosis not present

## 2019-09-03 ENCOUNTER — Other Ambulatory Visit: Payer: Self-pay

## 2019-09-03 DIAGNOSIS — E119 Type 2 diabetes mellitus without complications: Secondary | ICD-10-CM

## 2019-09-04 MED ORDER — METFORMIN HCL ER 500 MG PO TB24: ORAL_TABLET | ORAL | 3 refills | Status: DC

## 2019-09-06 ENCOUNTER — Ambulatory Visit: Payer: Medicare Other | Admitting: Family Medicine

## 2019-09-17 ENCOUNTER — Encounter (INDEPENDENT_AMBULATORY_CARE_PROVIDER_SITE_OTHER): Payer: Medicare Other | Admitting: Ophthalmology

## 2019-09-17 DIAGNOSIS — H35033 Hypertensive retinopathy, bilateral: Secondary | ICD-10-CM | POA: Diagnosis not present

## 2019-09-17 DIAGNOSIS — H43813 Vitreous degeneration, bilateral: Secondary | ICD-10-CM

## 2019-09-17 DIAGNOSIS — E113292 Type 2 diabetes mellitus with mild nonproliferative diabetic retinopathy without macular edema, left eye: Secondary | ICD-10-CM

## 2019-09-17 DIAGNOSIS — D3132 Benign neoplasm of left choroid: Secondary | ICD-10-CM

## 2019-09-17 DIAGNOSIS — I10 Essential (primary) hypertension: Secondary | ICD-10-CM

## 2019-09-17 DIAGNOSIS — E11319 Type 2 diabetes mellitus with unspecified diabetic retinopathy without macular edema: Secondary | ICD-10-CM

## 2019-09-17 DIAGNOSIS — H2513 Age-related nuclear cataract, bilateral: Secondary | ICD-10-CM

## 2019-09-17 DIAGNOSIS — E113311 Type 2 diabetes mellitus with moderate nonproliferative diabetic retinopathy with macular edema, right eye: Secondary | ICD-10-CM

## 2019-09-18 ENCOUNTER — Ambulatory Visit (INDEPENDENT_AMBULATORY_CARE_PROVIDER_SITE_OTHER): Payer: Medicare Other | Admitting: *Deleted

## 2019-09-18 ENCOUNTER — Other Ambulatory Visit: Payer: Self-pay

## 2019-09-18 DIAGNOSIS — Z23 Encounter for immunization: Secondary | ICD-10-CM

## 2019-09-26 DIAGNOSIS — G4733 Obstructive sleep apnea (adult) (pediatric): Secondary | ICD-10-CM | POA: Diagnosis not present

## 2019-10-02 ENCOUNTER — Other Ambulatory Visit: Payer: Self-pay | Admitting: *Deleted

## 2019-10-02 DIAGNOSIS — I10 Essential (primary) hypertension: Secondary | ICD-10-CM

## 2019-10-03 MED ORDER — LISINOPRIL 20 MG PO TABS: 20.0000 mg | ORAL_TABLET | Freq: Every day | ORAL | 3 refills | Status: DC

## 2019-10-15 ENCOUNTER — Encounter (INDEPENDENT_AMBULATORY_CARE_PROVIDER_SITE_OTHER): Payer: Medicare Other | Admitting: Ophthalmology

## 2019-10-15 ENCOUNTER — Other Ambulatory Visit: Payer: Self-pay

## 2019-10-15 DIAGNOSIS — E113211 Type 2 diabetes mellitus with mild nonproliferative diabetic retinopathy with macular edema, right eye: Secondary | ICD-10-CM | POA: Diagnosis not present

## 2019-10-15 DIAGNOSIS — I10 Essential (primary) hypertension: Secondary | ICD-10-CM | POA: Diagnosis not present

## 2019-10-15 DIAGNOSIS — D3132 Benign neoplasm of left choroid: Secondary | ICD-10-CM

## 2019-10-15 DIAGNOSIS — H43813 Vitreous degeneration, bilateral: Secondary | ICD-10-CM

## 2019-10-15 DIAGNOSIS — H35033 Hypertensive retinopathy, bilateral: Secondary | ICD-10-CM | POA: Diagnosis not present

## 2019-10-15 DIAGNOSIS — E11311 Type 2 diabetes mellitus with unspecified diabetic retinopathy with macular edema: Secondary | ICD-10-CM

## 2019-10-15 DIAGNOSIS — E113292 Type 2 diabetes mellitus with mild nonproliferative diabetic retinopathy without macular edema, left eye: Secondary | ICD-10-CM

## 2019-10-15 DIAGNOSIS — H2513 Age-related nuclear cataract, bilateral: Secondary | ICD-10-CM

## 2019-10-16 ENCOUNTER — Encounter: Payer: Self-pay | Admitting: Cardiovascular Disease

## 2019-10-16 ENCOUNTER — Ambulatory Visit (INDEPENDENT_AMBULATORY_CARE_PROVIDER_SITE_OTHER): Payer: Medicare Other | Admitting: Cardiovascular Disease

## 2019-10-16 VITALS — BP 158/70 | HR 61 | Ht 77.0 in | Wt 325.8 lb

## 2019-10-16 DIAGNOSIS — I10 Essential (primary) hypertension: Secondary | ICD-10-CM | POA: Diagnosis not present

## 2019-10-16 DIAGNOSIS — I739 Peripheral vascular disease, unspecified: Secondary | ICD-10-CM | POA: Diagnosis not present

## 2019-10-16 DIAGNOSIS — I2510 Atherosclerotic heart disease of native coronary artery without angina pectoris: Secondary | ICD-10-CM

## 2019-10-16 DIAGNOSIS — E785 Hyperlipidemia, unspecified: Secondary | ICD-10-CM | POA: Diagnosis not present
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
## 2019-10-16 NOTE — Patient Instructions (Signed)
Medication Instructions:  No changes *If you need a refill on your cardiac medications before your next appointment, please call your pharmacy*  Lab Work: None ordered If you have labs (blood work) drawn today and your tests are completely normal, you will receive your results only by: Marland Kitchen MyChart Message (if you have MyChart) OR . A paper copy in the mail If you have any lab test that is abnormal or we need to change your treatment, we will call you to review the results.  Testing/Procedures: None ordered  Follow-Up: At [facility], you and your health needs are our priority.  As part of our continuing mission to provide you with exceptional heart care, we have created designated Provider Care Teams.  These Care Teams include your primary Cardiologist (physician) and Advanced Practice Providers (APPs -  Physician Assistants and Nurse Practitioners) who all work together to provide you with the care you need, when you need it.  Your next appointment:   6 month(s)  The format for your next appointment:   In Person  Provider:   Kathlyn Sacramento, MD

## 2019-10-18 ENCOUNTER — Other Ambulatory Visit: Payer: Self-pay

## 2019-10-18 ENCOUNTER — Encounter: Payer: Self-pay | Admitting: Family Medicine

## 2019-10-18 ENCOUNTER — Ambulatory Visit (INDEPENDENT_AMBULATORY_CARE_PROVIDER_SITE_OTHER): Payer: Medicare Other | Admitting: Family Medicine

## 2019-10-18 VITALS — BP 132/60 | HR 64 | Wt 326.5 lb

## 2019-10-18 DIAGNOSIS — I10 Essential (primary) hypertension: Secondary | ICD-10-CM | POA: Diagnosis not present

## 2019-10-18 DIAGNOSIS — Z8546 Personal history of malignant neoplasm of prostate: Secondary | ICD-10-CM | POA: Diagnosis not present

## 2019-10-18 DIAGNOSIS — N183 Chronic kidney disease, stage 3 unspecified: Secondary | ICD-10-CM

## 2019-10-18 DIAGNOSIS — Z794 Long term (current) use of insulin: Secondary | ICD-10-CM | POA: Diagnosis not present

## 2019-10-18 DIAGNOSIS — E1169 Type 2 diabetes mellitus with other specified complication: Secondary | ICD-10-CM

## 2019-10-18 DIAGNOSIS — E1122 Type 2 diabetes mellitus with diabetic chronic kidney disease: Secondary | ICD-10-CM | POA: Diagnosis not present

## 2019-10-18 DIAGNOSIS — E785 Hyperlipidemia, unspecified: Secondary | ICD-10-CM

## 2019-10-18 LAB — POCT GLYCOSYLATED HEMOGLOBIN (HGB A1C): HbA1c, POC (controlled diabetic range): 7.2 % — AB (ref 0.0–7.0)

## 2019-10-18 NOTE — Patient Instructions (Signed)
Adam [name],  Was wonderful to see you today!  Your diabetes is stable.  I will check your kidney function today I may increase your Jardiance dose to 25 mg.  Also checking your cholesterol and PSA today and I will call you with the results of these blood tests.  Blood pressure is well controlled and I do not think we need to increase the dose of your lisinopril right now.  If your cholesterol levels are increased we could increase the dose of your statin medication or add on another medication for that.  I have to see you in January 2021 for a follow-up.  Happy holidays and best wishes,   Dr. Posey Pronto

## 2019-10-19 LAB — LIPID PANEL
Chol/HDL Ratio: 3 ratio (ref 0.0–5.0)
Cholesterol, Total: 107 mg/dL (ref 100–199)
HDL: 36 mg/dL — ABNORMAL LOW (ref >39)
LDL Chol Calc (NIH): 55 mg/dL (ref 0–99)
Triglycerides: 79 mg/dL (ref 0–149)
VLDL Cholesterol Cal: 16 mg/dL (ref 5–40)

## 2019-10-19 LAB — BASIC METABOLIC PANEL
BUN/Creatinine Ratio: 17 (ref 10–24)
BUN: 24 mg/dL (ref 8–27)
CO2: 19 mmol/L — ABNORMAL LOW (ref 20–29)
Calcium: 9 mg/dL (ref 8.6–10.2)
Chloride: 103 mmol/L (ref 96–106)
Creatinine, Ser: 1.38 mg/dL — ABNORMAL HIGH (ref 0.76–1.27)
GFR calc Af Amer: 59 mL/min/{1.73_m2} — ABNORMAL LOW (ref >59)
GFR calc non Af Amer: 51 mL/min/{1.73_m2} — ABNORMAL LOW (ref >59)
Glucose: 59 mg/dL — ABNORMAL LOW (ref 65–99)
Potassium: 4.4 mmol/L (ref 3.5–5.2)
Sodium: 137 mmol/L (ref 134–144)

## 2019-10-19 LAB — PSA: Prostate Specific Ag, Serum: 0.2 ng/mL (ref 0.0–4.0)

## 2019-10-21 DIAGNOSIS — E1169 Type 2 diabetes mellitus with other specified complication: Secondary | ICD-10-CM

## 2019-10-21 DIAGNOSIS — Z8546 Personal history of malignant neoplasm of prostate: Secondary | ICD-10-CM | POA: Insufficient documentation

## 2019-10-21 HISTORY — DX: Hyperlipidemia, unspecified: E11.69

## 2019-10-21 NOTE — Assessment & Plan Note (Signed)
Currently on Rosuvastatin 40mg . Will recheck lipid panel today, if LDL >70 will consider increasing dose or adjunctive therapy.

## 2019-10-21 NOTE — Progress Notes (Signed)
   Subjective:    Patient ID: Adam [name], male    DOB: 19-Mar-1946, 73 y.o.   MRN: [id number]   CC: Adam [name] is a 73 yr old male who presents for a diabetic check up.  HPI:  Diabetes HbA1c 7.2 today, 7.1 on last check. Takes Jardiance 10mg , Metformin 500mg  XR and Lantus 85 units BID. Denies side effects, no hypoglycemic episodes. Agreed to increasing Jardiance dose today if BMP stable today.  HTN Takes Lisinopril 20mg . Denies dizziness, chest pain etc. Tolerating well.  Hypercholesterolemia  Takes 40mg  Rosuvastatin. Denies side effects ie myalgias. Tolerating well.  Pt would like PSA checked today as has had a hx of prostate cancer.   Smoking status reviewed   ROS: pertinent noted in the HPI    Past medical history, surgical, family, and social history reviewed and updated in the EMR as appropriate. Reviewed problem list.   Objective:  BP 132/60   Pulse 64   Wt (!) 326 lb 8 oz (148.1 kg)   SpO2 94%   BMI 38.72 kg/m   Vitals and nursing note reviewed  General: NAD, pleasant, able to participate in exam, morbidly obese male Cardiac: RRR, S1 S2 present. normal heart sounds, no murmurs. Respiratory: CTAB, normal effort, No wheezes, rales or rhonchi Extremities: no edema or cyanosis. Skin: warm and dry, no rashes noted Neuro: alert, no obvious focal deficits Psych: Normal affect and mood   Assessment & Plan:    HYPERTENSION, BENIGN SYSTEMIC BP at goal today. Continue Lisinopril at current dose  Consider increasing on next visits if high.  Hyperlipidemia associated with type 2 diabetes mellitus (HCC) Currently on Rosuvastatin 40mg . Will recheck lipid panel today, if LDL >70 will consider increasing dose or adjunctive therapy.  DM (diabetes mellitus) (HCC) HbA1c stable. Will recheck BMP today. If stable pt is happy to increase SGLT2 inhibitor dose for tighter glycemic control. I will inform pt of labs when they are back. Counseled pt on UTIs and yeast  infections as side effect.   History of prostate cancer Pt has hx of prostate cancer treated with radiotherapy. Would like PSA checked today for monitoring.    Adam Haw, MD  [place] PGY-1

## 2019-10-21 NOTE — Assessment & Plan Note (Signed)
BP at goal today. Continue Lisinopril at current dose  Consider increasing on next visits if high.

## 2019-10-21 NOTE — Assessment & Plan Note (Signed)
Pt has hx of prostate cancer treated with radiotherapy. Would like PSA checked today for monitoring.

## 2019-10-21 NOTE — Assessment & Plan Note (Addendum)
HbA1c stable. Will recheck BMP today. If stable pt is happy to increase SGLT2 inhibitor dose for tighter glycemic control. I will inform pt of labs when they are back. Counseled pt on UTIs and yeast infections as side effect.

## 2019-11-01 ENCOUNTER — Other Ambulatory Visit: Payer: Self-pay | Admitting: Cardiovascular Disease

## 2019-11-06 ENCOUNTER — Other Ambulatory Visit: Payer: Self-pay | Admitting: *Deleted

## 2019-11-06 MED ORDER — ROSUVASTATIN CALCIUM 40 MG PO TABS: 40.0000 mg | ORAL_TABLET | Freq: Every day | ORAL | 3 refills | Status: DC

## 2019-11-07 ENCOUNTER — Other Ambulatory Visit: Payer: Self-pay | Admitting: Family Medicine

## 2019-11-09 ENCOUNTER — Other Ambulatory Visit: Payer: Self-pay

## 2019-11-09 DIAGNOSIS — IMO0002 Reserved for concepts with insufficient information to code with codable children: Secondary | ICD-10-CM

## 2019-11-09 DIAGNOSIS — E1165 Type 2 diabetes mellitus with hyperglycemia: Secondary | ICD-10-CM

## 2019-11-09 MED ORDER — GLUCOSE BLOOD VI STRP: ORAL_STRIP | 99 refills | Status: DC

## 2019-11-14 ENCOUNTER — Other Ambulatory Visit: Payer: Self-pay | Admitting: *Deleted

## 2019-11-14 MED ORDER — INSULIN LISPRO (1 UNIT DIAL) 100 UNIT/ML (KWIKPEN): PEN_INJECTOR | SUBCUTANEOUS | 2 refills | Status: DC

## 2019-11-15 ENCOUNTER — Other Ambulatory Visit: Payer: Self-pay | Admitting: Family Medicine

## 2019-11-15 DIAGNOSIS — N183 Chronic kidney disease, stage 3 unspecified: Secondary | ICD-10-CM

## 2019-11-15 DIAGNOSIS — E1122 Type 2 diabetes mellitus with diabetic chronic kidney disease: Secondary | ICD-10-CM

## 2019-11-15 MED ORDER — EMPAGLIFLOZIN 25 MG PO TABS: 25.0000 mg | ORAL_TABLET | Freq: Every day | ORAL | 3 refills | Status: DC

## 2019-11-16 ENCOUNTER — Other Ambulatory Visit: Payer: Self-pay | Admitting: Family Medicine

## 2019-11-19 ENCOUNTER — Encounter (INDEPENDENT_AMBULATORY_CARE_PROVIDER_SITE_OTHER): Payer: Medicare Other | Admitting: Ophthalmology

## 2019-11-19 ENCOUNTER — Other Ambulatory Visit: Payer: Self-pay

## 2019-11-19 DIAGNOSIS — H43813 Vitreous degeneration, bilateral: Secondary | ICD-10-CM

## 2019-11-19 DIAGNOSIS — E113311 Type 2 diabetes mellitus with moderate nonproliferative diabetic retinopathy with macular edema, right eye: Secondary | ICD-10-CM

## 2019-11-19 DIAGNOSIS — E113292 Type 2 diabetes mellitus with mild nonproliferative diabetic retinopathy without macular edema, left eye: Secondary | ICD-10-CM

## 2019-11-19 DIAGNOSIS — H35033 Hypertensive retinopathy, bilateral: Secondary | ICD-10-CM

## 2019-11-19 DIAGNOSIS — I10 Essential (primary) hypertension: Secondary | ICD-10-CM | POA: Diagnosis not present

## 2019-11-19 DIAGNOSIS — E11311 Type 2 diabetes mellitus with unspecified diabetic retinopathy with macular edema: Secondary | ICD-10-CM

## 2019-11-19 DIAGNOSIS — D3132 Benign neoplasm of left choroid: Secondary | ICD-10-CM

## 2019-11-29 DIAGNOSIS — G4733 Obstructive sleep apnea (adult) (pediatric): Secondary | ICD-10-CM | POA: Diagnosis not present

## 2019-12-24 ENCOUNTER — Encounter (INDEPENDENT_AMBULATORY_CARE_PROVIDER_SITE_OTHER): Payer: Medicare Other | Admitting: Ophthalmology

## 2019-12-26 ENCOUNTER — Other Ambulatory Visit: Payer: Self-pay | Admitting: *Deleted

## 2019-12-26 DIAGNOSIS — IMO0002 Reserved for concepts with insufficient information to code with codable children: Secondary | ICD-10-CM

## 2019-12-26 DIAGNOSIS — E1165 Type 2 diabetes mellitus with hyperglycemia: Secondary | ICD-10-CM

## 2019-12-26 DIAGNOSIS — E118 Type 2 diabetes mellitus with unspecified complications: Secondary | ICD-10-CM

## 2019-12-27 MED ORDER — INSULIN GLARGINE 100 UNIT/ML ~~LOC~~ SOLN: 85.0000 [IU] | Freq: Two times a day (BID) | SUBCUTANEOUS | 6 refills | Status: DC

## 2019-12-31 ENCOUNTER — Other Ambulatory Visit: Payer: Self-pay

## 2019-12-31 ENCOUNTER — Encounter (INDEPENDENT_AMBULATORY_CARE_PROVIDER_SITE_OTHER): Payer: Medicare Other | Admitting: Ophthalmology

## 2019-12-31 DIAGNOSIS — H2513 Age-related nuclear cataract, bilateral: Secondary | ICD-10-CM

## 2019-12-31 DIAGNOSIS — E11311 Type 2 diabetes mellitus with unspecified diabetic retinopathy with macular edema: Secondary | ICD-10-CM | POA: Diagnosis not present

## 2019-12-31 DIAGNOSIS — H35033 Hypertensive retinopathy, bilateral: Secondary | ICD-10-CM

## 2019-12-31 DIAGNOSIS — E113292 Type 2 diabetes mellitus with mild nonproliferative diabetic retinopathy without macular edema, left eye: Secondary | ICD-10-CM

## 2019-12-31 DIAGNOSIS — D3132 Benign neoplasm of left choroid: Secondary | ICD-10-CM

## 2019-12-31 DIAGNOSIS — I10 Essential (primary) hypertension: Secondary | ICD-10-CM | POA: Diagnosis not present

## 2019-12-31 DIAGNOSIS — H43813 Vitreous degeneration, bilateral: Secondary | ICD-10-CM

## 2019-12-31 DIAGNOSIS — E113311 Type 2 diabetes mellitus with moderate nonproliferative diabetic retinopathy with macular edema, right eye: Secondary | ICD-10-CM

## 2020-01-03 DIAGNOSIS — G4733 Obstructive sleep apnea (adult) (pediatric): Secondary | ICD-10-CM | POA: Diagnosis not present

## 2020-01-14 ENCOUNTER — Telehealth: Payer: Self-pay | Admitting: *Deleted

## 2020-01-14 NOTE — Telephone Encounter (Signed)
Received fax requesting a refill on  B-DPEN NDL MINI 31GX5MM(3/16)PRPL There are two on his list but neither really matched this one so I sent it to you this way.Adam [name], CMA

## 2020-01-16 NOTE — Telephone Encounter (Signed)
Checking to see if this has been completed.Raela Bohl Zimmerman Rumple, CMA

## 2020-01-22 ENCOUNTER — Telehealth: Payer: Self-pay | Admitting: *Deleted

## 2020-01-22 MED ORDER — BD PEN NEEDLE SHORT U/F 31G X 8 MM MISC: 1.0000 | Freq: Every day | 12 refills | Status: DC

## 2020-01-23 NOTE — Telephone Encounter (Signed)
April I did not see this form in my inbox?

## 2020-01-24 NOTE — Telephone Encounter (Signed)
Pens have been ordered. 01/22/2020. Ottis Stain, CMA

## 2020-02-04 ENCOUNTER — Encounter (INDEPENDENT_AMBULATORY_CARE_PROVIDER_SITE_OTHER): Payer: Medicare Other | Admitting: Ophthalmology

## 2020-02-08 ENCOUNTER — Other Ambulatory Visit: Payer: Self-pay | Admitting: *Deleted

## 2020-02-08 MED ORDER — "INSULIN SYRINGE-NEEDLE U-100 31G X 15/64"" 0.5 ML MISC": 12 refills | Status: DC

## 2020-02-08 NOTE — Telephone Encounter (Signed)
Pt requested to have the same syringes as wife.  Sent in with Dr. Serita Grit verbal permission. Christen Bame, CMA

## 2020-02-11 ENCOUNTER — Other Ambulatory Visit: Payer: Self-pay | Admitting: Family Medicine

## 2020-02-14 MED ORDER — "INSULIN SYRINGE-NEEDLE U-100 31G X 15/64"" 1 ML MISC": 12 refills | Status: DC

## 2020-02-14 NOTE — Telephone Encounter (Signed)
Patient calls nurse line stating the syringes that were called in only hold 50 units. I called the pharmacy to get clarification, as I am not familiar with syringes. Per pharmacy, the 97mL syringe holds 100units, we called 0.58mL. Resent.

## 2020-02-14 NOTE — Addendum Note (Signed)
Addended by: Dorna Bloom on: 02/14/2020 05:24 PM   Modules accepted: Orders

## 2020-02-18 ENCOUNTER — Encounter (INDEPENDENT_AMBULATORY_CARE_PROVIDER_SITE_OTHER): Payer: Medicare Other | Admitting: Ophthalmology

## 2020-02-18 DIAGNOSIS — E113311 Type 2 diabetes mellitus with moderate nonproliferative diabetic retinopathy with macular edema, right eye: Secondary | ICD-10-CM

## 2020-02-18 DIAGNOSIS — H35033 Hypertensive retinopathy, bilateral: Secondary | ICD-10-CM | POA: Diagnosis not present

## 2020-02-18 DIAGNOSIS — E11311 Type 2 diabetes mellitus with unspecified diabetic retinopathy with macular edema: Secondary | ICD-10-CM | POA: Diagnosis not present

## 2020-02-18 DIAGNOSIS — H43813 Vitreous degeneration, bilateral: Secondary | ICD-10-CM

## 2020-02-18 DIAGNOSIS — E113292 Type 2 diabetes mellitus with mild nonproliferative diabetic retinopathy without macular edema, left eye: Secondary | ICD-10-CM

## 2020-02-18 DIAGNOSIS — I10 Essential (primary) hypertension: Secondary | ICD-10-CM

## 2020-02-25 ENCOUNTER — Other Ambulatory Visit: Payer: Self-pay

## 2020-02-25 DIAGNOSIS — E1165 Type 2 diabetes mellitus with hyperglycemia: Secondary | ICD-10-CM

## 2020-02-25 DIAGNOSIS — IMO0002 Reserved for concepts with insufficient information to code with codable children: Secondary | ICD-10-CM

## 2020-02-25 MED ORDER — INSULIN GLARGINE 100 UNIT/ML ~~LOC~~ SOLN: 85.0000 [IU] | Freq: Two times a day (BID) | SUBCUTANEOUS | 6 refills | Status: DC

## 2020-02-25 NOTE — Telephone Encounter (Signed)
Patient calls nurse line stating he needs a 30 day supply of Lantus called in. Patient stated her has been getting on a 25 day supply. Please advise.

## 2020-03-05 ENCOUNTER — Other Ambulatory Visit: Payer: Self-pay

## 2020-03-05 DIAGNOSIS — I25118 Atherosclerotic heart disease of native coronary artery with other forms of angina pectoris: Secondary | ICD-10-CM

## 2020-03-06 MED ORDER — CARVEDILOL 3.125 MG PO TABS: 3.1250 mg | ORAL_TABLET | Freq: Two times a day (BID) | ORAL | 1 refills | Status: DC

## 2020-03-07 ENCOUNTER — Other Ambulatory Visit: Payer: Self-pay | Admitting: *Deleted

## 2020-03-07 MED ORDER — ESOMEPRAZOLE MAGNESIUM 40 MG PO CPDR: 40.0000 mg | DELAYED_RELEASE_CAPSULE | Freq: Every day | ORAL | 11 refills | Status: DC

## 2020-03-07 MED ORDER — SERTRALINE HCL 50 MG PO TABS: ORAL_TABLET | ORAL | 3 refills | Status: DC

## 2020-03-24 ENCOUNTER — Encounter (INDEPENDENT_AMBULATORY_CARE_PROVIDER_SITE_OTHER): Payer: Medicare Other | Admitting: Ophthalmology

## 2020-03-24 ENCOUNTER — Other Ambulatory Visit: Payer: Self-pay

## 2020-03-24 DIAGNOSIS — I10 Essential (primary) hypertension: Secondary | ICD-10-CM | POA: Diagnosis not present

## 2020-03-24 DIAGNOSIS — H43813 Vitreous degeneration, bilateral: Secondary | ICD-10-CM

## 2020-03-24 DIAGNOSIS — E113292 Type 2 diabetes mellitus with mild nonproliferative diabetic retinopathy without macular edema, left eye: Secondary | ICD-10-CM | POA: Diagnosis not present

## 2020-03-24 DIAGNOSIS — E113311 Type 2 diabetes mellitus with moderate nonproliferative diabetic retinopathy with macular edema, right eye: Secondary | ICD-10-CM | POA: Diagnosis not present

## 2020-03-24 DIAGNOSIS — H35033 Hypertensive retinopathy, bilateral: Secondary | ICD-10-CM

## 2020-03-24 DIAGNOSIS — E11311 Type 2 diabetes mellitus with unspecified diabetic retinopathy with macular edema: Secondary | ICD-10-CM | POA: Diagnosis not present

## 2020-04-02 DIAGNOSIS — G4733 Obstructive sleep apnea (adult) (pediatric): Secondary | ICD-10-CM | POA: Diagnosis not present

## 2020-04-06 ENCOUNTER — Other Ambulatory Visit: Payer: Self-pay | Admitting: Family Medicine

## 2020-04-10 ENCOUNTER — Other Ambulatory Visit: Payer: Self-pay

## 2020-04-10 DIAGNOSIS — IMO0002 Reserved for concepts with insufficient information to code with codable children: Secondary | ICD-10-CM

## 2020-04-11 MED ORDER — INSULIN GLARGINE 100 UNIT/ML ~~LOC~~ SOLN: 85.0000 [IU] | Freq: Two times a day (BID) | SUBCUTANEOUS | 6 refills | Status: DC

## 2020-04-28 ENCOUNTER — Other Ambulatory Visit: Payer: Self-pay

## 2020-04-28 ENCOUNTER — Encounter (INDEPENDENT_AMBULATORY_CARE_PROVIDER_SITE_OTHER): Payer: Medicare Other | Admitting: Ophthalmology

## 2020-04-28 DIAGNOSIS — H35033 Hypertensive retinopathy, bilateral: Secondary | ICD-10-CM | POA: Diagnosis not present

## 2020-04-28 DIAGNOSIS — I10 Essential (primary) hypertension: Secondary | ICD-10-CM | POA: Diagnosis not present

## 2020-04-28 DIAGNOSIS — E11311 Type 2 diabetes mellitus with unspecified diabetic retinopathy with macular edema: Secondary | ICD-10-CM | POA: Diagnosis not present

## 2020-04-28 DIAGNOSIS — H2513 Age-related nuclear cataract, bilateral: Secondary | ICD-10-CM

## 2020-04-28 DIAGNOSIS — E113392 Type 2 diabetes mellitus with moderate nonproliferative diabetic retinopathy without macular edema, left eye: Secondary | ICD-10-CM | POA: Diagnosis not present

## 2020-04-28 DIAGNOSIS — E113311 Type 2 diabetes mellitus with moderate nonproliferative diabetic retinopathy with macular edema, right eye: Secondary | ICD-10-CM | POA: Diagnosis not present

## 2020-04-28 DIAGNOSIS — H43813 Vitreous degeneration, bilateral: Secondary | ICD-10-CM

## 2020-04-28 DIAGNOSIS — D3132 Benign neoplasm of left choroid: Secondary | ICD-10-CM

## 2020-05-06 ENCOUNTER — Ambulatory Visit: Payer: Medicare Other | Admitting: Cardiovascular Disease

## 2020-05-15 ENCOUNTER — Telehealth: Payer: Self-pay | Admitting: Cardiovascular Disease

## 2020-05-15 NOTE — Telephone Encounter (Signed)
LVM for patient to return call to get follow up scheduled with Arida from recall list

## 2020-05-20 ENCOUNTER — Other Ambulatory Visit: Payer: Self-pay | Admitting: Family Medicine

## 2020-05-21 NOTE — Telephone Encounter (Signed)
Patient calling to check status of rx refill.   Talbot Grumbling, RN

## 2020-05-27 DIAGNOSIS — E113293 Type 2 diabetes mellitus with mild nonproliferative diabetic retinopathy without macular edema, bilateral: Secondary | ICD-10-CM | POA: Diagnosis not present

## 2020-05-27 DIAGNOSIS — H25813 Combined forms of age-related cataract, bilateral: Secondary | ICD-10-CM | POA: Diagnosis not present

## 2020-05-27 DIAGNOSIS — Z794 Long term (current) use of insulin: Secondary | ICD-10-CM | POA: Diagnosis not present

## 2020-05-27 DIAGNOSIS — H43813 Vitreous degeneration, bilateral: Secondary | ICD-10-CM | POA: Diagnosis not present

## 2020-06-09 ENCOUNTER — Other Ambulatory Visit: Payer: Self-pay

## 2020-06-09 ENCOUNTER — Encounter (INDEPENDENT_AMBULATORY_CARE_PROVIDER_SITE_OTHER): Payer: Medicare Other | Admitting: Ophthalmology

## 2020-06-09 DIAGNOSIS — I10 Essential (primary) hypertension: Secondary | ICD-10-CM

## 2020-06-09 DIAGNOSIS — E113311 Type 2 diabetes mellitus with moderate nonproliferative diabetic retinopathy with macular edema, right eye: Secondary | ICD-10-CM | POA: Diagnosis not present

## 2020-06-09 DIAGNOSIS — H35033 Hypertensive retinopathy, bilateral: Secondary | ICD-10-CM

## 2020-06-09 DIAGNOSIS — H2513 Age-related nuclear cataract, bilateral: Secondary | ICD-10-CM

## 2020-06-09 DIAGNOSIS — E11311 Type 2 diabetes mellitus with unspecified diabetic retinopathy with macular edema: Secondary | ICD-10-CM | POA: Diagnosis not present

## 2020-06-09 DIAGNOSIS — E113292 Type 2 diabetes mellitus with mild nonproliferative diabetic retinopathy without macular edema, left eye: Secondary | ICD-10-CM | POA: Diagnosis not present

## 2020-06-09 DIAGNOSIS — H43813 Vitreous degeneration, bilateral: Secondary | ICD-10-CM

## 2020-06-12 ENCOUNTER — Other Ambulatory Visit: Payer: Self-pay

## 2020-06-12 ENCOUNTER — Encounter: Payer: Self-pay | Admitting: Family Medicine

## 2020-06-12 ENCOUNTER — Ambulatory Visit (INDEPENDENT_AMBULATORY_CARE_PROVIDER_SITE_OTHER): Payer: Medicare Other | Admitting: Family Medicine

## 2020-06-12 VITALS — BP 134/62 | HR 70 | Ht 77.0 in | Wt 332.0 lb

## 2020-06-12 DIAGNOSIS — R200 Anesthesia of skin: Secondary | ICD-10-CM

## 2020-06-12 DIAGNOSIS — IMO0002 Reserved for concepts with insufficient information to code with codable children: Secondary | ICD-10-CM

## 2020-06-12 DIAGNOSIS — E1165 Type 2 diabetes mellitus with hyperglycemia: Secondary | ICD-10-CM | POA: Diagnosis not present

## 2020-06-12 DIAGNOSIS — E1122 Type 2 diabetes mellitus with diabetic chronic kidney disease: Secondary | ICD-10-CM | POA: Diagnosis not present

## 2020-06-12 DIAGNOSIS — R202 Paresthesia of skin: Secondary | ICD-10-CM

## 2020-06-12 DIAGNOSIS — N183 Chronic kidney disease, stage 3 unspecified: Secondary | ICD-10-CM | POA: Diagnosis not present

## 2020-06-12 DIAGNOSIS — Z794 Long term (current) use of insulin: Secondary | ICD-10-CM

## 2020-06-12 DIAGNOSIS — E118 Type 2 diabetes mellitus with unspecified complications: Secondary | ICD-10-CM

## 2020-06-12 LAB — POCT GLYCOSYLATED HEMOGLOBIN (HGB A1C): HbA1c, POC (controlled diabetic range): 7 % (ref 0.0–7.0)

## 2020-06-12 MED ORDER — INSULIN GLARGINE 100 UNIT/ML ~~LOC~~ SOLN: 80.0000 [IU] | Freq: Two times a day (BID) | SUBCUTANEOUS | 0 refills | Status: DC

## 2020-06-12 NOTE — Progress Notes (Signed)
    SUBJECTIVE:   CHIEF COMPLAINT / HPI:   Adam [name] is a 74 year old male who presents today for diabetes follow-up  Diabetes Patient takes Jardiance 25 mg and Lantus 85 units twice daily. Tolerating well without side effects. Denies vision changes, polyuria, polydipsia or abdominal pain. Is interested in starting GLP 1 agonist in the future to help with weight loss.  Right hand numbness and pain 3 month hx of right hand numbness and pain. Worse at night when lying flat and better during the day or when sitting up. Sleeps on his left side. It "hurts so bad". Has not tried analgesia for the pain. Denies myalgias or arthralgias. Something he feels his right shoulder clicking.  PERTINENT  PMH / PSH: Obesity, diabetes, obstructive sleep apnea, GERD, diabetic neuropathy  OBJECTIVE:   BP 134/62   Pulse 70   Ht 6\' 5"  (1.956 m)   Wt (!) 332 lb (150.6 kg)   SpO2 95%   BMI 39.37 kg/m    General: Alert, no acute distress, appears stated age, obese male  Cardio: well perfused Pulm: Normal respiratory effort Neuro: Cranial nerves grossly intact. 5/5 strength UE bilaterally, normal sensation. DTR +, Tinnels and phalens test negative   ASSESSMENT/PLAN:   DM (diabetes mellitus) ([place]) A1c at goal. Congratulated patient on his efforts. Continue Jardiance 25mg , decreased Lantus dose to 80 units BID. Follow up A1c in 3 months. Will consider switching to GLP1 agonist then.  Numbness and tingling in right hand Normal exam of upper extremities today. Normal strength and sensation. No numbness. Considering carpal tunnel syndrome as top differential. Considered stroke however chronic symptoms so less likely and no other neurological symptoms such as limb weakness, dysphasia, ataxia etc. Advised pt to wear a wrist splint at night to see if this helps with symptoms. Follow up with me if persistent symptoms.     Lattie Haw, MD [place]

## 2020-06-12 NOTE — Patient Instructions (Signed)
Mr Summer it was  [place] to see you today! Your diabetes is much improved. Well done keep up the great work!! For your hand numbness it could be carpal tunnel syndrome when your nerve gets compressed. Please wear a splint at night and see if this helps.  Please follow up with me in 2-3 weeks and we can review the hand numbness and also think about switching you to a different diabetes medicine.  Best wishes Dr Posey Pronto

## 2020-06-15 DIAGNOSIS — R200 Anesthesia of skin: Secondary | ICD-10-CM | POA: Insufficient documentation

## 2020-06-15 NOTE — Assessment & Plan Note (Signed)
Normal exam of upper extremities today. Normal strength and sensation. No numbness. Considering carpal tunnel syndrome as top differential. Considered stroke however chronic symptoms so less likely and no other neurological symptoms such as limb weakness, dysphasia, ataxia etc. Advised pt to wear a wrist splint at night to see if this helps with symptoms. Follow up with me if persistent symptoms.

## 2020-06-15 NOTE — Assessment & Plan Note (Signed)
A1c at goal. Congratulated patient on his efforts. Continue Jardiance 25mg , decreased Lantus dose to 80 units BID. Follow up A1c in 3 months. Will consider switching to GLP1 agonist then.

## 2020-06-17 ENCOUNTER — Ambulatory Visit (INDEPENDENT_AMBULATORY_CARE_PROVIDER_SITE_OTHER): Payer: Medicare Other | Admitting: Cardiovascular Disease

## 2020-06-17 ENCOUNTER — Other Ambulatory Visit: Payer: Self-pay

## 2020-06-17 ENCOUNTER — Encounter: Payer: Self-pay | Admitting: Cardiovascular Disease

## 2020-06-17 VITALS — BP 126/70 | HR 67 | Ht 77.0 in | Wt 334.6 lb

## 2020-06-17 DIAGNOSIS — E785 Hyperlipidemia, unspecified: Secondary | ICD-10-CM

## 2020-06-17 DIAGNOSIS — I739 Peripheral vascular disease, unspecified: Secondary | ICD-10-CM | POA: Diagnosis not present

## 2020-06-17 DIAGNOSIS — I2510 Atherosclerotic heart disease of native coronary artery without angina pectoris: Secondary | ICD-10-CM

## 2020-06-17 DIAGNOSIS — I10 Essential (primary) hypertension: Secondary | ICD-10-CM | POA: Diagnosis not present
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
## 2020-06-17 NOTE — Patient Instructions (Signed)
Medication Instructions:  No changes *If you need a refill on your cardiac medications before your next appointment, please call your pharmacy*   Lab Work: None Ordered If you have labs (blood work) drawn today and your tests are completely normal, you will receive your results only by: Marland Kitchen MyChart Message (if you have MyChart) OR . A paper copy in the mail If you have any lab test that is abnormal or we need to change your treatment, we will call you to review the results.   Testing/Procedures: Your physician has requested that you have a lower  extremity arterial duplex. This test is an ultrasound of the arteries in the legs or arms. It looks at arterial blood flow in the legs and arms. Allow one hour for Lower and Upper Arterial scans. There are no restrictions or special instructions  Your physician has requested that you have an ankle brachial index (ABI). During this test an ultrasound and blood pressure cuff are used to evaluate the arteries that supply the arms and legs with blood. Allow thirty minutes for this exam. There are no restrictions or special instructions. This will take place at [place], [address].      Follow-Up: At [facility], you and your health needs are our priority.  As part of our continuing mission to provide you with exceptional heart care, we have created designated Provider Care Teams.  These Care Teams include your primary Cardiologist (physician) and Advanced Practice Providers (APPs -  Physician Assistants and Nurse Practitioners) who all work together to provide you with the care you need, when you need it.  We recommend signing up for the patient portal called "MyChart".  Sign up information is provided on this After Visit Summary.  MyChart is used to connect with patients for Virtual Visits (Telemedicine).  Patients are able to view lab/test results, encounter notes, upcoming appointments, etc.  Non-urgent messages can be sent to your provider  as well.   To learn more about what you can do with MyChart, go to [email or web address].    Your next appointment:   6 month(s)  The format for your next appointment:   In Person  Provider:   Kathlyn Sacramento, MD   Other Instructions

## 2020-06-20 ENCOUNTER — Telehealth: Payer: Self-pay

## 2020-06-20 NOTE — Telephone Encounter (Signed)
Patient Adam [name] on nurse line requesting to be referred to a specialist for his right shoulder and hand numbness. Patient was last seen 8/12 by PCP and looks like this issue was mentioned. Please place a referral if appropriate.

## 2020-06-22 ENCOUNTER — Other Ambulatory Visit: Payer: Self-pay | Admitting: Family Medicine

## 2020-06-23 ENCOUNTER — Other Ambulatory Visit: Payer: Self-pay | Admitting: Family Medicine

## 2020-06-23 DIAGNOSIS — R200 Anesthesia of skin: Secondary | ICD-10-CM

## 2020-06-23 NOTE — Telephone Encounter (Signed)
Thank you I have placed a referral for sports medicine in [place].

## 2020-06-25 NOTE — Telephone Encounter (Signed)
Just wanted to confirm that this was a sports medicine referral. It was sent to ortho and pt already has an appointment them. Ivi Griffith Zimmerman Rumple, CMA

## 2020-06-25 NOTE — Telephone Encounter (Signed)
It was a sports medicine referral but orthopedics will be fine too. I am glad he already has an appointment. We can keep that! Thank you

## 2020-07-01 ENCOUNTER — Ambulatory Visit: Payer: Medicare Other | Admitting: Orthopaedic Surgery

## 2020-07-15 DIAGNOSIS — G4733 Obstructive sleep apnea (adult) (pediatric): Secondary | ICD-10-CM | POA: Diagnosis not present

## 2020-07-21 ENCOUNTER — Encounter (INDEPENDENT_AMBULATORY_CARE_PROVIDER_SITE_OTHER): Payer: Medicare Other | Admitting: Ophthalmology

## 2020-07-21 ENCOUNTER — Other Ambulatory Visit: Payer: Self-pay

## 2020-07-21 DIAGNOSIS — I10 Essential (primary) hypertension: Secondary | ICD-10-CM | POA: Diagnosis not present

## 2020-07-21 DIAGNOSIS — E113292 Type 2 diabetes mellitus with mild nonproliferative diabetic retinopathy without macular edema, left eye: Secondary | ICD-10-CM

## 2020-07-21 DIAGNOSIS — H35033 Hypertensive retinopathy, bilateral: Secondary | ICD-10-CM

## 2020-07-21 DIAGNOSIS — E113311 Type 2 diabetes mellitus with moderate nonproliferative diabetic retinopathy with macular edema, right eye: Secondary | ICD-10-CM

## 2020-07-21 DIAGNOSIS — E11311 Type 2 diabetes mellitus with unspecified diabetic retinopathy with macular edema: Secondary | ICD-10-CM | POA: Diagnosis not present

## 2020-07-21 DIAGNOSIS — D3132 Benign neoplasm of left choroid: Secondary | ICD-10-CM

## 2020-07-21 DIAGNOSIS — H43813 Vitreous degeneration, bilateral: Secondary | ICD-10-CM

## 2020-07-23 ENCOUNTER — Other Ambulatory Visit: Payer: Self-pay

## 2020-07-23 ENCOUNTER — Ambulatory Visit (HOSPITAL_COMMUNITY)
Admission: RE | Admit: 2020-07-23 | Discharge: 2020-07-23 | Disposition: A | Discharge status: Home / Self Care | Payer: Medicare Other | Source: Ambulatory Visit | Attending: Cardiovascular Disease | Admitting: Cardiovascular Disease

## 2020-07-23 DIAGNOSIS — I739 Peripheral vascular disease, unspecified: Secondary | ICD-10-CM | POA: Insufficient documentation

## 2020-08-25 ENCOUNTER — Other Ambulatory Visit: Payer: Self-pay

## 2020-08-25 DIAGNOSIS — IMO0002 Reserved for concepts with insufficient information to code with codable children: Secondary | ICD-10-CM

## 2020-08-25 DIAGNOSIS — E1165 Type 2 diabetes mellitus with hyperglycemia: Secondary | ICD-10-CM

## 2020-08-25 MED ORDER — INSULIN GLARGINE 100 UNIT/ML ~~LOC~~ SOLN: 80.0000 [IU] | Freq: Two times a day (BID) | SUBCUTANEOUS | 0 refills | Status: DC

## 2020-08-29 DIAGNOSIS — G4733 Obstructive sleep apnea (adult) (pediatric): Secondary | ICD-10-CM | POA: Diagnosis not present

## 2020-08-30 ENCOUNTER — Other Ambulatory Visit: Payer: Self-pay | Admitting: Family Medicine

## 2020-08-30 DIAGNOSIS — E119 Type 2 diabetes mellitus without complications: Secondary | ICD-10-CM

## 2020-09-01 ENCOUNTER — Other Ambulatory Visit: Payer: Self-pay

## 2020-09-01 ENCOUNTER — Encounter (INDEPENDENT_AMBULATORY_CARE_PROVIDER_SITE_OTHER): Payer: Medicare Other | Admitting: Ophthalmology

## 2020-09-01 DIAGNOSIS — E11311 Type 2 diabetes mellitus with unspecified diabetic retinopathy with macular edema: Secondary | ICD-10-CM | POA: Diagnosis not present

## 2020-09-01 DIAGNOSIS — E113311 Type 2 diabetes mellitus with moderate nonproliferative diabetic retinopathy with macular edema, right eye: Secondary | ICD-10-CM

## 2020-09-01 DIAGNOSIS — I10 Essential (primary) hypertension: Secondary | ICD-10-CM | POA: Diagnosis not present

## 2020-09-01 DIAGNOSIS — D3132 Benign neoplasm of left choroid: Secondary | ICD-10-CM

## 2020-09-01 DIAGNOSIS — H35033 Hypertensive retinopathy, bilateral: Secondary | ICD-10-CM

## 2020-09-01 DIAGNOSIS — E113292 Type 2 diabetes mellitus with mild nonproliferative diabetic retinopathy without macular edema, left eye: Secondary | ICD-10-CM | POA: Diagnosis not present

## 2020-09-01 DIAGNOSIS — H43813 Vitreous degeneration, bilateral: Secondary | ICD-10-CM

## 2020-09-26 ENCOUNTER — Other Ambulatory Visit: Payer: Self-pay | Admitting: Family Medicine

## 2020-09-26 DIAGNOSIS — I10 Essential (primary) hypertension: Secondary | ICD-10-CM

## 2020-09-29 ENCOUNTER — Other Ambulatory Visit: Payer: Self-pay

## 2020-09-29 DIAGNOSIS — IMO0002 Reserved for concepts with insufficient information to code with codable children: Secondary | ICD-10-CM

## 2020-09-30 MED ORDER — INSULIN GLARGINE 100 UNIT/ML ~~LOC~~ SOLN: 80.0000 [IU] | Freq: Two times a day (BID) | SUBCUTANEOUS | 0 refills | Status: DC

## 2020-10-06 ENCOUNTER — Encounter (INDEPENDENT_AMBULATORY_CARE_PROVIDER_SITE_OTHER): Payer: Medicare Other | Admitting: Ophthalmology

## 2020-10-06 ENCOUNTER — Other Ambulatory Visit: Payer: Self-pay

## 2020-10-06 DIAGNOSIS — I10 Essential (primary) hypertension: Secondary | ICD-10-CM | POA: Diagnosis not present

## 2020-10-06 DIAGNOSIS — E11311 Type 2 diabetes mellitus with unspecified diabetic retinopathy with macular edema: Secondary | ICD-10-CM | POA: Diagnosis not present

## 2020-10-06 DIAGNOSIS — H35033 Hypertensive retinopathy, bilateral: Secondary | ICD-10-CM

## 2020-10-06 DIAGNOSIS — H43813 Vitreous degeneration, bilateral: Secondary | ICD-10-CM

## 2020-10-06 DIAGNOSIS — E113392 Type 2 diabetes mellitus with moderate nonproliferative diabetic retinopathy without macular edema, left eye: Secondary | ICD-10-CM

## 2020-10-06 DIAGNOSIS — E113311 Type 2 diabetes mellitus with moderate nonproliferative diabetic retinopathy with macular edema, right eye: Secondary | ICD-10-CM | POA: Diagnosis not present

## 2020-10-06 DIAGNOSIS — D3132 Benign neoplasm of left choroid: Secondary | ICD-10-CM

## 2020-10-15 ENCOUNTER — Other Ambulatory Visit: Payer: Self-pay

## 2020-10-15 MED ORDER — TRAZODONE HCL 100 MG PO TABS: 100.0000 mg | ORAL_TABLET | Freq: Every evening | ORAL | 0 refills | Status: DC | PRN

## 2020-10-21 ENCOUNTER — Other Ambulatory Visit: Payer: Self-pay | Admitting: Family Medicine

## 2020-10-26 ENCOUNTER — Other Ambulatory Visit: Payer: Self-pay | Admitting: Family Medicine

## 2020-11-06 ENCOUNTER — Other Ambulatory Visit: Payer: Self-pay | Admitting: Family Medicine

## 2020-11-06 DIAGNOSIS — IMO0002 Reserved for concepts with insufficient information to code with codable children: Secondary | ICD-10-CM

## 2020-11-06 DIAGNOSIS — E1165 Type 2 diabetes mellitus with hyperglycemia: Secondary | ICD-10-CM

## 2020-11-09 ENCOUNTER — Other Ambulatory Visit: Payer: Self-pay | Admitting: Family Medicine

## 2020-11-10 ENCOUNTER — Encounter (INDEPENDENT_AMBULATORY_CARE_PROVIDER_SITE_OTHER): Payer: Medicare Other | Admitting: Ophthalmology

## 2020-11-15 ENCOUNTER — Other Ambulatory Visit: Payer: Self-pay | Admitting: Family Medicine

## 2020-11-15 DIAGNOSIS — IMO0002 Reserved for concepts with insufficient information to code with codable children: Secondary | ICD-10-CM

## 2020-11-15 DIAGNOSIS — E1165 Type 2 diabetes mellitus with hyperglycemia: Secondary | ICD-10-CM

## 2020-11-17 ENCOUNTER — Encounter (INDEPENDENT_AMBULATORY_CARE_PROVIDER_SITE_OTHER): Payer: Medicare Other | Admitting: Ophthalmology

## 2020-11-21 DIAGNOSIS — E1169 Type 2 diabetes mellitus with other specified complication: Secondary | ICD-10-CM | POA: Diagnosis not present

## 2020-11-21 DIAGNOSIS — Z1159 Encounter for screening for other viral diseases: Secondary | ICD-10-CM | POA: Diagnosis not present

## 2020-11-21 DIAGNOSIS — R202 Paresthesia of skin: Secondary | ICD-10-CM | POA: Diagnosis not present

## 2020-11-21 DIAGNOSIS — R252 Cramp and spasm: Secondary | ICD-10-CM | POA: Diagnosis not present

## 2020-11-24 ENCOUNTER — Encounter (INDEPENDENT_AMBULATORY_CARE_PROVIDER_SITE_OTHER): Payer: Medicare Other | Admitting: Ophthalmology

## 2020-11-25 ENCOUNTER — Other Ambulatory Visit: Payer: Self-pay | Admitting: Family Medicine

## 2020-11-25 DIAGNOSIS — I25118 Atherosclerotic heart disease of native coronary artery with other forms of angina pectoris: Secondary | ICD-10-CM

## 2020-11-27 DIAGNOSIS — G4733 Obstructive sleep apnea (adult) (pediatric): Secondary | ICD-10-CM | POA: Diagnosis not present

## 2020-12-01 DIAGNOSIS — G4733 Obstructive sleep apnea (adult) (pediatric): Secondary | ICD-10-CM | POA: Diagnosis not present

## 2020-12-01 DIAGNOSIS — U071 COVID-19: Secondary | ICD-10-CM | POA: Diagnosis not present

## 2020-12-08 ENCOUNTER — Encounter (INDEPENDENT_AMBULATORY_CARE_PROVIDER_SITE_OTHER): Payer: Medicare Other | Admitting: Ophthalmology

## 2020-12-08 ENCOUNTER — Other Ambulatory Visit: Payer: Self-pay | Admitting: Family Medicine

## 2020-12-08 ENCOUNTER — Other Ambulatory Visit: Payer: Self-pay

## 2020-12-08 DIAGNOSIS — H43813 Vitreous degeneration, bilateral: Secondary | ICD-10-CM

## 2020-12-08 DIAGNOSIS — E113311 Type 2 diabetes mellitus with moderate nonproliferative diabetic retinopathy with macular edema, right eye: Secondary | ICD-10-CM

## 2020-12-08 DIAGNOSIS — I10 Essential (primary) hypertension: Secondary | ICD-10-CM | POA: Diagnosis not present

## 2020-12-08 DIAGNOSIS — H35033 Hypertensive retinopathy, bilateral: Secondary | ICD-10-CM

## 2020-12-08 DIAGNOSIS — E113292 Type 2 diabetes mellitus with mild nonproliferative diabetic retinopathy without macular edema, left eye: Secondary | ICD-10-CM | POA: Diagnosis not present

## 2020-12-08 DIAGNOSIS — D3132 Benign neoplasm of left choroid: Secondary | ICD-10-CM

## 2020-12-08 LAB — HM DIABETES EYE EXAM

## 2020-12-09 ENCOUNTER — Ambulatory Visit (INDEPENDENT_AMBULATORY_CARE_PROVIDER_SITE_OTHER): Payer: Medicare Other | Admitting: Cardiovascular Disease

## 2020-12-09 ENCOUNTER — Encounter: Payer: Self-pay | Admitting: Cardiovascular Disease

## 2020-12-09 VITALS — BP 120/50 | HR 70 | Ht 77.0 in | Wt 334.0 lb

## 2020-12-09 DIAGNOSIS — I10 Essential (primary) hypertension: Secondary | ICD-10-CM

## 2020-12-09 DIAGNOSIS — I739 Peripheral vascular disease, unspecified: Secondary | ICD-10-CM

## 2020-12-09 DIAGNOSIS — I2510 Atherosclerotic heart disease of native coronary artery without angina pectoris: Secondary | ICD-10-CM | POA: Diagnosis not present

## 2020-12-09 DIAGNOSIS — E785 Hyperlipidemia, unspecified: Secondary | ICD-10-CM | POA: Diagnosis not present
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
## 2020-12-09 NOTE — Patient Instructions (Signed)
Medication Instructions:  Continue current medications  *If you need a refill on your cardiac medications before your next appointment, please call your pharmacy*   Lab Work: None Ordered  Testing/Procedures: None Ordered   Follow-Up: At [facility], you and your health needs are our priority.  As part of our continuing mission to provide you with exceptional heart care, we have created designated Provider Care Teams.  These Care Teams include your primary Cardiologist (physician) and Advanced Practice Providers (APPs -  Physician Assistants and Nurse Practitioners) who all work together to provide you with the care you need, when you need it.  We recommend signing up for the patient portal called "MyChart".  Sign up information is provided on this After Visit Summary.  MyChart is used to connect with patients for Virtual Visits (Telemedicine).  Patients are able to view lab/test results, encounter notes, upcoming appointments, etc.  Non-urgent messages can be sent to your provider as well.   To learn more about what you can do with MyChart, go to [email or web address].    Your next appointment:   1 year(s)  The format for your next appointment:   In Person  Provider:   You may see Dr Fletcher Anon or one of the following Advanced Practice Providers on your designated Care Team:    Kerin Ransom, PA-C  [place], [place]  Coletta Memos, [place]

## 2020-12-18 DIAGNOSIS — U071 COVID-19: Secondary | ICD-10-CM | POA: Diagnosis not present

## 2020-12-26 DIAGNOSIS — I5033 Acute on chronic diastolic (congestive) heart failure: Secondary | ICD-10-CM | POA: Diagnosis not present

## 2021-01-05 ENCOUNTER — Encounter (INDEPENDENT_AMBULATORY_CARE_PROVIDER_SITE_OTHER): Payer: Medicare Other | Admitting: Ophthalmology

## 2021-01-05 DIAGNOSIS — R112 Nausea with vomiting, unspecified: Secondary | ICD-10-CM | POA: Diagnosis not present

## 2021-01-15 DIAGNOSIS — N189 Chronic kidney disease, unspecified: Secondary | ICD-10-CM | POA: Diagnosis not present

## 2021-01-19 ENCOUNTER — Other Ambulatory Visit: Payer: Self-pay

## 2021-01-19 ENCOUNTER — Encounter (INDEPENDENT_AMBULATORY_CARE_PROVIDER_SITE_OTHER): Payer: Medicare Other | Admitting: Ophthalmology

## 2021-01-19 DIAGNOSIS — E113311 Type 2 diabetes mellitus with moderate nonproliferative diabetic retinopathy with macular edema, right eye: Secondary | ICD-10-CM

## 2021-01-19 DIAGNOSIS — H2513 Age-related nuclear cataract, bilateral: Secondary | ICD-10-CM | POA: Diagnosis not present

## 2021-01-19 DIAGNOSIS — D3132 Benign neoplasm of left choroid: Secondary | ICD-10-CM | POA: Diagnosis not present

## 2021-01-19 DIAGNOSIS — I10 Essential (primary) hypertension: Secondary | ICD-10-CM | POA: Diagnosis not present

## 2021-01-19 DIAGNOSIS — H43813 Vitreous degeneration, bilateral: Secondary | ICD-10-CM

## 2021-01-19 DIAGNOSIS — H35033 Hypertensive retinopathy, bilateral: Secondary | ICD-10-CM | POA: Diagnosis not present

## 2021-01-19 DIAGNOSIS — E113392 Type 2 diabetes mellitus with moderate nonproliferative diabetic retinopathy without macular edema, left eye: Secondary | ICD-10-CM | POA: Diagnosis not present

## 2021-01-20 ENCOUNTER — Telehealth: Payer: Self-pay | Admitting: *Deleted

## 2021-01-20 NOTE — Telephone Encounter (Signed)
Patient lvm requesting a referral to see kidney doctor.  There was no further explanation and I had to leave a message for patient to call us back.  Patient needs an appt to see pcp to follow up on his diabetes as well as discuss this referral.  Please assist patient with then when/if he calls back.    Thanks [employer]

## 2021-01-21 DIAGNOSIS — R252 Cramp and spasm: Secondary | ICD-10-CM | POA: Diagnosis not present

## 2021-01-21 DIAGNOSIS — R112 Nausea with vomiting, unspecified: Secondary | ICD-10-CM | POA: Diagnosis not present

## 2021-02-09 ENCOUNTER — Encounter: Payer: Self-pay | Admitting: Family Medicine

## 2021-02-09 ENCOUNTER — Other Ambulatory Visit: Payer: Self-pay

## 2021-02-09 ENCOUNTER — Ambulatory Visit (INDEPENDENT_AMBULATORY_CARE_PROVIDER_SITE_OTHER): Payer: Medicare Other | Admitting: Family Medicine

## 2021-02-09 VITALS — BP 142/62 | HR 60 | Ht 77.0 in | Wt 322.4 lb

## 2021-02-09 DIAGNOSIS — Z794 Long term (current) use of insulin: Secondary | ICD-10-CM

## 2021-02-09 DIAGNOSIS — N183 Chronic kidney disease, stage 3 unspecified: Secondary | ICD-10-CM

## 2021-02-09 DIAGNOSIS — I10 Essential (primary) hypertension: Secondary | ICD-10-CM

## 2021-02-09 DIAGNOSIS — Z8546 Personal history of malignant neoplasm of prostate: Secondary | ICD-10-CM | POA: Diagnosis not present

## 2021-02-09 DIAGNOSIS — E1122 Type 2 diabetes mellitus with diabetic chronic kidney disease: Secondary | ICD-10-CM

## 2021-02-09 DIAGNOSIS — E785 Hyperlipidemia, unspecified: Secondary | ICD-10-CM

## 2021-02-09 DIAGNOSIS — C61 Malignant neoplasm of prostate: Secondary | ICD-10-CM

## 2021-02-09 LAB — POCT GLYCOSYLATED HEMOGLOBIN (HGB A1C): HbA1c, POC (controlled diabetic range): 7 % (ref 0.0–7.0)

## 2021-02-09 MED ORDER — AMLODIPINE BESYLATE 5 MG PO TABS: 5.0000 mg | ORAL_TABLET | Freq: Every day | ORAL | 3 refills | Status: DC

## 2021-02-09 MED ORDER — OZEMPIC (0.25 OR 0.5 MG/DOSE) 2 MG/1.5ML ~~LOC~~ SOPN: 0.2500 mg | PEN_INJECTOR | SUBCUTANEOUS | 0 refills | Status: AC

## 2021-02-09 NOTE — Assessment & Plan Note (Deleted)
History of prostate cancer treated with radiation and Lupron.  Obtained PSA today per patient's request.

## 2021-02-09 NOTE — Assessment & Plan Note (Signed)
BP slightly elevated to 142/62.  He has also been having some elevated readings at home since discontinuing lisinopril for CKD.  He is awaiting nephrology appointment.  We will start 5 mg amlodipine today.  Follow-up in 2 to 4 weeks.

## 2021-02-09 NOTE — Assessment & Plan Note (Signed)
Obtained lipid panel today.  Continue rosuvastatin 40 mg.

## 2021-02-09 NOTE — Assessment & Plan Note (Signed)
History of prostate cancer treated with radiation and Lupron.  Obtained PSA today per patient's request.

## 2021-02-09 NOTE — Progress Notes (Signed)
     SUBJECTIVE:   CHIEF COMPLAINT / HPI:   Adam [name] is a 75 y.o. male presents for diabetes follow up  Hx of prostate cancer Would like PSA  Diabetes Patient's current diabetic medications include metformin and jardiance. Tolerating well without side effects.  Patient endorses compliance with these medications. CBG readings averaging in the 60-260 range.  Patient's last A1c was  Lab Results  Component Value Date   HGBA1C 7.0 02/09/2021   HGBA1C 7.0 06/12/2020   HGBA1C 7.2 (A) 10/18/2019     Denies abdominal pain, blurred vision, polyuria, polydipsia, hypoglycemia. Patient states they understand that diet and exercise can help with her diabetes.   Last Microalbumin, LDL, Creatinine: Lab Results  Component Value Date   MICROALBUR 5.94 (H) 02/25/2012   LDLCALC 55 10/18/2019   CREATININE 1.38 (H) 10/18/2019   HLD Patient is currently taking rosuvastin 40mg . Endorses compliance and tolerating well without side effects. Denies RUQ pain or myalgias.  Hypertension Patient's current antihypertensive  medications include: carvedilol. Compliant with medications and tolerating well without side effects.  Checking BP at home with readings between 130/71 and 160/84. Denies any SOB, CP, vision changes, LE edema, medication SEs, or symptoms of hypotension.   Most recent creatinine trend:  Lab Results  Component Value Date   CREATININE 1.38 (H) 10/18/2019   CREATININE 1.47 (H) 01/23/2019   CREATININE 1.26 06/26/2018     Patient has had a BMP in the past 1 year.   [place] Office Visit from 06/12/2020 in [place]  PHQ-9 Total Score 1       Health Maintenance Due  Topic  . COVID-19 Vaccine (1)  . COLONOSCOPY (Pts 45-55yrs Insurance coverage will need to be confirmed)   . FOOT EXAM       PERTINENT  PMH / PSH: DM, morbid obesity  OBJECTIVE:   BP (!) 142/62   Pulse 60   Ht 6\' 5"  (1.956 m)   Wt (!) 322 lb 6.4 oz (146.2 kg)   SpO2 94%    BMI 38.23 kg/m    General: Alert, no acute distress, well appearing, pleasant Cardio: Normal S1 and S2, RRR, no r/m/g Pulm: CTAB, normal work of breathing Extremities: No peripheral edema.  Neuro: Cranial nerves grossly intact   ASSESSMENT/PLAN:   DM (diabetes mellitus) ([place]) A1c 7 today and at goal.  Congratulated patient on his efforts.  He is also been actively trying to lose weight and has lost 12 pounds in the last 2 months.  Discussed option of starting Ozempic for added weight loss benefit which patient is happy to start.  Prescribe 0.25 mg Ozempic weekly.  Also obtained BMP today. Follow-up in 1 month.  Hyperlipidemia Obtained lipid panel today.  Continue rosuvastatin 40 mg.  History of prostate cancer History of prostate cancer treated with radiation and Lupron.  Obtained PSA today per patient's request.  HYPERTENSION, BENIGN SYSTEMIC BP slightly elevated to 142/62.  He has also been having some elevated readings at home since discontinuing lisinopril for CKD.  He is awaiting nephrology appointment.  We will start 5 mg amlodipine today.  Follow-up in 2 to 4 weeks.     Lattie Haw, MD PGY-2 [place]

## 2021-02-09 NOTE — Assessment & Plan Note (Addendum)
A1c 7 today and at goal.  Congratulated patient on his efforts.  He is also been actively trying to lose weight and has lost 12 pounds in the last 2 months.  Discussed option of starting Ozempic for added weight loss benefit which patient is happy to start.  Prescribe 0.25 mg Ozempic weekly.  Also obtained BMP today. Follow-up in 1 month.

## 2021-02-09 NOTE — Patient Instructions (Signed)
Thank you for coming to see me today. It was a pleasure. Today we discussed we discussed your diabetes. You are doing very well. Congratulations. I am starting you on OZEMPIC injections once weekly. Start on 0.25mg  weekly. I recommend follow up in 4 weeks.  BP is a little high. I will start amlodipine 5mg   We will get some labs today.  If they are abnormal or we need to do something about them, I will call you.  If they are normal, I will send you a message on MyChart (if it is active) or a letter in the mail.  If you don't hear from [place] in 2 weeks, please call the office at the number below.  Please follow-up with me in 4 weeks   If you have any questions or concerns, please do not hesitate to call the office at [phone].  Best wishes,   Dr Posey Pronto

## 2021-02-10 LAB — BASIC METABOLIC PANEL
BUN/Creatinine Ratio: 12 (ref 10–24)
BUN: 19 mg/dL (ref 8–27)
CO2: 21 mmol/L (ref 20–29)
Calcium: 9.1 mg/dL (ref 8.6–10.2)
Chloride: 99 mmol/L (ref 96–106)
Creatinine, Ser: 1.54 mg/dL — ABNORMAL HIGH (ref 0.76–1.27)
Glucose: 67 mg/dL (ref 65–99)
Potassium: 4 mmol/L (ref 3.5–5.2)
Sodium: 136 mmol/L (ref 134–144)
eGFR: 47 mL/min/{1.73_m2} — ABNORMAL LOW (ref >59)

## 2021-02-10 LAB — LIPID PANEL
Chol/HDL Ratio: 3.1 ratio (ref 0.0–5.0)
Cholesterol, Total: 122 mg/dL (ref 100–199)
HDL: 39 mg/dL — ABNORMAL LOW (ref >39)
LDL Chol Calc (NIH): 65 mg/dL (ref 0–99)
Triglycerides: 91 mg/dL (ref 0–149)
VLDL Cholesterol Cal: 18 mg/dL (ref 5–40)

## 2021-02-10 LAB — PSA: Prostate Specific Ag, Serum: 0.2 ng/mL (ref 0.0–4.0)

## 2021-02-12 ENCOUNTER — Other Ambulatory Visit: Payer: Self-pay | Admitting: Family Medicine

## 2021-02-19 DIAGNOSIS — I129 Hypertensive chronic kidney disease with stage 1 through stage 4 chronic kidney disease, or unspecified chronic kidney disease: Secondary | ICD-10-CM | POA: Diagnosis not present

## 2021-02-19 DIAGNOSIS — N179 Acute kidney failure, unspecified: Secondary | ICD-10-CM | POA: Diagnosis not present

## 2021-02-19 DIAGNOSIS — N1831 Chronic kidney disease, stage 3a: Secondary | ICD-10-CM | POA: Diagnosis not present

## 2021-02-19 DIAGNOSIS — N184 Chronic kidney disease, stage 4 (severe): Secondary | ICD-10-CM | POA: Diagnosis not present

## 2021-02-19 DIAGNOSIS — E1122 Type 2 diabetes mellitus with diabetic chronic kidney disease: Secondary | ICD-10-CM | POA: Diagnosis not present

## 2021-02-19 DIAGNOSIS — I2510 Atherosclerotic heart disease of native coronary artery without angina pectoris: Secondary | ICD-10-CM | POA: Diagnosis not present

## 2021-02-23 ENCOUNTER — Other Ambulatory Visit: Payer: Self-pay

## 2021-02-23 ENCOUNTER — Encounter (INDEPENDENT_AMBULATORY_CARE_PROVIDER_SITE_OTHER): Payer: Medicare Other | Admitting: Ophthalmology

## 2021-02-23 DIAGNOSIS — E113292 Type 2 diabetes mellitus with mild nonproliferative diabetic retinopathy without macular edema, left eye: Secondary | ICD-10-CM | POA: Diagnosis not present

## 2021-02-23 DIAGNOSIS — I10 Essential (primary) hypertension: Secondary | ICD-10-CM | POA: Diagnosis not present

## 2021-02-23 DIAGNOSIS — E113311 Type 2 diabetes mellitus with moderate nonproliferative diabetic retinopathy with macular edema, right eye: Secondary | ICD-10-CM

## 2021-02-23 DIAGNOSIS — D3132 Benign neoplasm of left choroid: Secondary | ICD-10-CM | POA: Diagnosis not present

## 2021-02-23 DIAGNOSIS — H35033 Hypertensive retinopathy, bilateral: Secondary | ICD-10-CM | POA: Diagnosis not present

## 2021-02-23 DIAGNOSIS — H43813 Vitreous degeneration, bilateral: Secondary | ICD-10-CM | POA: Diagnosis not present

## 2021-02-24 MED ORDER — ESOMEPRAZOLE MAGNESIUM 40 MG PO CPDR
40.0000 mg | DELAYED_RELEASE_CAPSULE | Freq: Every day | ORAL | 11 refills | Status: DC
Start: 2021-02-24 — End: 2021-02-26

## 2021-02-24 MED ORDER — CILOSTAZOL 100 MG PO TABS
ORAL_TABLET | ORAL | 3 refills | Status: DC
Start: 2021-02-24 — End: 2022-02-17

## 2021-02-24 MED ORDER — SERTRALINE HCL 50 MG PO TABS
ORAL_TABLET | ORAL | 3 refills | Status: DC
Start: 2021-02-24 — End: 2022-02-17

## 2021-02-24 NOTE — Telephone Encounter (Signed)
Patient calls nurse line regarding nexium rx. Patient is requesting 90 days supply, as this is what insurance covers. Please update rx to reflect 90 day quantity if appropriate.   Talbot Grumbling, RN

## 2021-02-25 DIAGNOSIS — G4733 Obstructive sleep apnea (adult) (pediatric): Secondary | ICD-10-CM | POA: Diagnosis not present

## 2021-02-26 ENCOUNTER — Other Ambulatory Visit: Payer: Self-pay | Admitting: Family Medicine

## 2021-02-26 MED ORDER — ESOMEPRAZOLE MAGNESIUM 40 MG PO CPDR: 40.0000 mg | DELAYED_RELEASE_CAPSULE | Freq: Every day | ORAL | 0 refills | Status: AC

## 2021-02-26 NOTE — Telephone Encounter (Signed)
Sent in 90 day nexium refill to pharmacy. Please could you inform the patient? Thank you.

## 2021-03-06 DIAGNOSIS — N1831 Chronic kidney disease, stage 3a: Secondary | ICD-10-CM | POA: Diagnosis not present

## 2021-03-13 ENCOUNTER — Ambulatory Visit: Payer: Medicare Other | Admitting: Family Medicine

## 2021-03-19 ENCOUNTER — Telehealth: Payer: Self-pay

## 2021-03-19 NOTE — Telephone Encounter (Signed)
Patient LVM on nurse line requesting an Ozempic refill. I do not see this on current medication list. Will forward to PCP for advisement.

## 2021-03-23 ENCOUNTER — Other Ambulatory Visit: Payer: Self-pay

## 2021-03-23 ENCOUNTER — Encounter (INDEPENDENT_AMBULATORY_CARE_PROVIDER_SITE_OTHER): Payer: Medicare Other | Admitting: Ophthalmology

## 2021-03-23 ENCOUNTER — Other Ambulatory Visit: Payer: Self-pay | Admitting: Family Medicine

## 2021-03-23 DIAGNOSIS — E1122 Type 2 diabetes mellitus with diabetic chronic kidney disease: Secondary | ICD-10-CM

## 2021-03-23 DIAGNOSIS — E113311 Type 2 diabetes mellitus with moderate nonproliferative diabetic retinopathy with macular edema, right eye: Secondary | ICD-10-CM | POA: Diagnosis not present

## 2021-03-23 DIAGNOSIS — H43813 Vitreous degeneration, bilateral: Secondary | ICD-10-CM | POA: Diagnosis not present

## 2021-03-23 DIAGNOSIS — E113292 Type 2 diabetes mellitus with mild nonproliferative diabetic retinopathy without macular edema, left eye: Secondary | ICD-10-CM | POA: Diagnosis not present

## 2021-03-23 DIAGNOSIS — Z794 Long term (current) use of insulin: Secondary | ICD-10-CM

## 2021-03-23 DIAGNOSIS — D3132 Benign neoplasm of left choroid: Secondary | ICD-10-CM

## 2021-03-23 DIAGNOSIS — I10 Essential (primary) hypertension: Secondary | ICD-10-CM | POA: Diagnosis not present

## 2021-03-23 DIAGNOSIS — H35033 Hypertensive retinopathy, bilateral: Secondary | ICD-10-CM | POA: Diagnosis not present

## 2021-03-23 MED ORDER — SEMAGLUTIDE(0.25 OR 0.5MG/DOS) 2 MG/1.5ML ~~LOC~~ SOPN: 0.5000 mg | PEN_INJECTOR | SUBCUTANEOUS | 3 refills | Status: DC

## 2021-03-26 DIAGNOSIS — N1831 Chronic kidney disease, stage 3a: Secondary | ICD-10-CM | POA: Diagnosis not present

## 2021-04-03 ENCOUNTER — Other Ambulatory Visit: Payer: Self-pay | Admitting: Family Medicine

## 2021-04-03 ENCOUNTER — Encounter: Payer: Self-pay | Admitting: Family Medicine

## 2021-04-03 ENCOUNTER — Ambulatory Visit (INDEPENDENT_AMBULATORY_CARE_PROVIDER_SITE_OTHER): Payer: Medicare Other | Admitting: Family Medicine

## 2021-04-03 ENCOUNTER — Other Ambulatory Visit: Payer: Self-pay

## 2021-04-03 DIAGNOSIS — Z794 Long term (current) use of insulin: Secondary | ICD-10-CM | POA: Diagnosis not present

## 2021-04-03 DIAGNOSIS — E1122 Type 2 diabetes mellitus with diabetic chronic kidney disease: Secondary | ICD-10-CM

## 2021-04-03 DIAGNOSIS — N183 Chronic kidney disease, stage 3 unspecified: Secondary | ICD-10-CM

## 2021-04-03 DIAGNOSIS — I10 Essential (primary) hypertension: Secondary | ICD-10-CM | POA: Diagnosis not present

## 2021-04-03 DIAGNOSIS — Z6836 Body mass index (BMI) 36.0-36.9, adult: Secondary | ICD-10-CM

## 2021-04-03 MED ORDER — LISINOPRIL 10 MG PO TABS: 10.0000 mg | ORAL_TABLET | Freq: Every day | ORAL | 3 refills | Status: DC

## 2021-04-03 MED ORDER — SEMAGLUTIDE(0.25 OR 0.5MG/DOS) 2 MG/1.5ML ~~LOC~~ SOPN: 0.5000 mg | PEN_INJECTOR | SUBCUTANEOUS | 0 refills | Status: DC

## 2021-04-03 NOTE — Assessment & Plan Note (Addendum)
Increased ozempic to 0.5mg  weekly. Follow up with me in 1 month. Recommended taking Lantus 20 units if CBGs >140. Would like pt to keep fasting CBG diary at home and we can assess the need for lantus based on these readings at the the next visit.

## 2021-04-03 NOTE — Progress Notes (Signed)
     SUBJECTIVE:   CHIEF COMPLAINT / HPI:   Jahmil Macleod is a 75 y.o. male presents for weight loss follow up  Weight loss encounter Started on ozempic 0.25mg  2 months ago. Lost 11lb. Tolerating well without side effects. Not eating as large quantities as before.   Hypertension Patient's current antihypertensive  medications include: lisinopril and coreg.  Compliant with medications and tolerating well without side effects.  Checking BP at home with readings 120/60.  Denies any SOB, CP, vision changes, LE edema, medication SEs, or symptoms of hypotension.   Most recent creatinine trend:  Lab Results  Component Value Date   CREATININE 1.54 (H) 02/09/2021   CREATININE 1.38 (H) 10/18/2019   CREATININE 1.47 (H) 01/23/2019     Patient has had a BMP in the past 1 year.   Diabetes Patient's current diabetic medications include lantus 50 units PRN based on CBG readings. Stopped humalog.  Patient endorses compliance with these medications. CBG readings averaging in the 120s range.  Patient's last A1c was  Lab Results  Component Value Date   HGBA1C 7.0 02/09/2021   HGBA1C 7.0 06/12/2020   HGBA1C 7.2 (A) 10/18/2019   Denies abdominal pain, blurred vision, polyuria, polydipsia, hypoglycemia. Patient states they understand that diet and exercise can help with her diabetes.  Last Microalbumin, LDL, Creatinine: Lab Results  Component Value Date   MICROALBUR 5.94 (H) 02/25/2012   LDLCALC 65 02/09/2021   CREATININE 1.54 (H) 02/09/2021    Flowsheet Row Office Visit from 04/03/2021 in [place]  PHQ-9 Total Score 0       Health Maintenance Due  Topic  . Pneumococcal Vaccine 26-66 Years old (1 of 4 - PCV13)  . Zoster Vaccines- Shingrix (1 of 2)  . COVID-19 Vaccine (4 - Booster for Moderna series)      PERTINENT  PMH / PSH: Diabetes, CAD, OSA   OBJECTIVE:   BP (!) 148/61   Pulse (!) 55   Ht 6\' 5"  (1.956 m)   Wt (!) 311 lb (141.1 kg)   SpO2 97%    BMI 36.88 kg/m    General: Alert, no acute distress Cardio: well perfused  Pulm: normal work of breathing Extremities: No peripheral edema.  Neuro: Cranial nerves grossly intact   Diabetic foot exam was performed.  No deformities or other abnormal visual findings.  Posterior tibialis and dorsalis pulse intact bilaterally.  Intact to touch and monofilament testing bilaterally.   ASSESSMENT/PLAN:   OBESITY Congratulated pt in losing 11lb since last visit. He is now able to move much easier. He is eating well and is well motivated. He will also start at the gym. Discussed referring to [place]. He will think about it and let me know.   HYPERTENSION, BENIGN SYSTEMIC BP slightly elevated today 14/61. Home readings have been in the 124 systolic. Nephrologist restarted Lisinopril 10mg . Provided pt with refill.   DM (diabetes mellitus) ([place]) Increased ozempic to 0.5mg  weekly. Follow up with me in 1 month. Recommended taking Lantus 20 units if CBGs >140. Would like pt to keep fasting CBG diary at home and we can assess the need for lantus based on these readings at the the next visit.      Lattie Haw, MD PGY-2 [place]

## 2021-04-03 NOTE — Assessment & Plan Note (Signed)
BP slightly elevated today 14/61. Home readings have been in the 937 systolic. Nephrologist restarted Lisinopril 10mg . Provided pt with refill.

## 2021-04-03 NOTE — Assessment & Plan Note (Signed)
Congratulated pt in losing 11lb since last visit. He is now able to move much easier. He is eating well and is well motivated. He will also start at the gym. Discussed referring to [place]. He will think about it and let me know.

## 2021-04-03 NOTE — Patient Instructions (Addendum)
Thank you for coming to see me today. It was a pleasure. Today we discussed your weight loss. Congratulations on losing 11lb. I am proud of you!  Keep up the great work.  I recommend increasing the dose 0.5mg  Ozempic every week. Continue lisinopril.   Keep blood sugar diary at home of fasting sugars. Bring with you at the next visit.   Please follow-up with me in 4 weeks   If you have any questions or concerns, please do not hesitate to call the office at [phone].  Best wishes,   Dr Posey Pronto

## 2021-04-14 ENCOUNTER — Ambulatory Visit: Payer: Medicare Other | Admitting: Licensed Clinical Social Worker

## 2021-04-14 NOTE — Chronic Care Management (AMB) (Signed)
  Care Management   Clinical Social Work Phone Note  04/14/2021 Name: Adam [name] MRN: [id number] DOB: December 08, 1945 Adam [name] is a 75 y.o. year old male who sees Adam Haw, MD for primary care.    The Care Management team was consulted to assess patient's needs after disconnection with services provided by [facility].  Assessment: Patient's wife provided all information during this encounter. Reports patient is doing well, [facility] was good when they needed however no longer needs the service at this time. Recommendation: none at this time  Follow up Plan: No follow up scheduled with CCM team at this time. Will call office if needed.   Review of patient status, including review of consultants reports, relevant laboratory and other test results, and collaboration with appropriate care team members and the patient's provider was performed as part of comprehensive patient evaluation and provision of chronic care management services.    Casimer Lanius, [place] / [place]   [phone] 12:26 PM

## 2021-04-17 ENCOUNTER — Other Ambulatory Visit: Payer: Self-pay

## 2021-04-18 MED ORDER — TRAZODONE HCL 100 MG PO TABS: 100.0000 mg | ORAL_TABLET | Freq: Every evening | ORAL | 0 refills | Status: DC | PRN

## 2021-04-27 ENCOUNTER — Encounter (INDEPENDENT_AMBULATORY_CARE_PROVIDER_SITE_OTHER): Payer: Medicare Other | Admitting: Ophthalmology

## 2021-04-27 ENCOUNTER — Other Ambulatory Visit: Payer: Self-pay

## 2021-04-27 DIAGNOSIS — H43813 Vitreous degeneration, bilateral: Secondary | ICD-10-CM | POA: Diagnosis not present

## 2021-04-27 DIAGNOSIS — I10 Essential (primary) hypertension: Secondary | ICD-10-CM | POA: Diagnosis not present

## 2021-04-27 DIAGNOSIS — E113392 Type 2 diabetes mellitus with moderate nonproliferative diabetic retinopathy without macular edema, left eye: Secondary | ICD-10-CM | POA: Diagnosis not present

## 2021-04-27 DIAGNOSIS — E113311 Type 2 diabetes mellitus with moderate nonproliferative diabetic retinopathy with macular edema, right eye: Secondary | ICD-10-CM

## 2021-04-27 DIAGNOSIS — D3132 Benign neoplasm of left choroid: Secondary | ICD-10-CM

## 2021-04-27 DIAGNOSIS — H35033 Hypertensive retinopathy, bilateral: Secondary | ICD-10-CM | POA: Diagnosis not present

## 2021-05-04 NOTE — Progress Notes (Addendum)
     SUBJECTIVE:   CHIEF COMPLAINT / HPI:   Adam [name] is a 75 y.o. male presents for weight loss counseling  Weight loss counseling  Pt is on Ozempic 0.5mg  weekly. Weight today 305lb 9.6 oz. Weight at previous visit was 311lb. Tolerating medication well without side effects. Eating a high protein, low carb diet.    Diabetes Patient's current diabetic medications include Jardiance, metformin, ozempic and PRN lantus. Has stopped short acting insulin due to improvement in CBGs. Tolerating well without side effects.  Patient endorses compliance with these medications. CBG readings averaging in the 78-112 range.  Patient's last A1c was  Lab Results  Component Value Date   HGBA1C 6.2 05/05/2021   HGBA1C 7.0 02/09/2021   HGBA1C 7.0 06/12/2020  Denies abdominal pain, blurred vision, polyuria, polydipsia, hypoglycemia.   Last Microalbumin, LDL, Creatinine: Lab Results  Component Value Date   MICROALBUR 5.94 (H) 02/25/2012   LDLCALC 65 02/09/2021   CREATININE 1.54 (H) 02/09/2021    Flowsheet Row Office Visit from 05/05/2021 in [place] [facility]  PHQ-9 Total Score 0        Hypertension: Patient's current antihypertensive  medications include: Lisinopril 10mg . Compliant with medication however experiencing dizziness recently. Home readings in 371G systolic. Currently asymptomatic.  Most recent creatinine trend:  Lab Results  Component Value Date   CREATININE 1.54 (H) 02/09/2021   CREATININE 1.38 (H) 10/18/2019   CREATININE 1.47 (H) 01/23/2019     Patient  had a BMP in the past 1 year.   PERTINENT  PMH / PSH: DM, MI, CAD  OBJECTIVE:   BP (!) 116/54   Pulse (!) 59   Ht 6\' 5"  (1.956 m)   Wt (!) 305 lb 9.6 oz (138.6 kg)   SpO2 94%   BMI 36.24 kg/m    General: Alert, no acute distress Cardio: well perfused Pulm: normal work of breathing Neuro: Cranial nerves grossly intact   ASSESSMENT/PLAN:   DM (diabetes mellitus) ([facility]) A1c 6.2, excellent  control. Goal 7-8. Congratulated pt on his excellent efforts. Recommend stopping metformin, lantus and short acting insulin. Continue Ozempic at increased dose 1mg  and Jardiance 25mg . Diabetic follow up in 3 months.  OBESITY Further 6lb weight loss since previous visit. Congratulated pt. Ozempic increased to 1mg  today. Follow up in 1 month.  HYPERTENSION, BENIGN SYSTEMIC BP 116/54, his home readings have been soft too in 100s. Pt is feeling dizzy on current dose of Lisinopril. Reduced to 5mg  today. Recommended stopping this medication if he continues to feel dizzy. Recommended he inform his nephrologist.     Lattie Haw, MD PGY-3 [place]

## 2021-05-05 ENCOUNTER — Other Ambulatory Visit: Payer: Self-pay

## 2021-05-05 ENCOUNTER — Ambulatory Visit (INDEPENDENT_AMBULATORY_CARE_PROVIDER_SITE_OTHER): Payer: Medicare Other | Admitting: Family Medicine

## 2021-05-05 ENCOUNTER — Encounter: Payer: Self-pay | Admitting: Family Medicine

## 2021-05-05 VITALS — BP 116/54 | HR 59 | Ht 77.0 in | Wt 305.6 lb

## 2021-05-05 DIAGNOSIS — Z6836 Body mass index (BMI) 36.0-36.9, adult: Secondary | ICD-10-CM

## 2021-05-05 DIAGNOSIS — Z794 Long term (current) use of insulin: Secondary | ICD-10-CM | POA: Diagnosis not present

## 2021-05-05 DIAGNOSIS — N183 Chronic kidney disease, stage 3 unspecified: Secondary | ICD-10-CM

## 2021-05-05 DIAGNOSIS — I10 Essential (primary) hypertension: Secondary | ICD-10-CM

## 2021-05-05 DIAGNOSIS — E1122 Type 2 diabetes mellitus with diabetic chronic kidney disease: Secondary | ICD-10-CM

## 2021-05-05 LAB — POCT GLYCOSYLATED HEMOGLOBIN (HGB A1C): HbA1c, POC (controlled diabetic range): 6.2 % (ref 0.0–7.0)

## 2021-05-05 MED ORDER — LISINOPRIL 5 MG PO TABS: 5.0000 mg | ORAL_TABLET | Freq: Every day | ORAL | 3 refills | Status: DC

## 2021-05-05 MED ORDER — OZEMPIC (0.25 OR 0.5 MG/DOSE) 2 MG/1.5ML ~~LOC~~ SOPN: 0.7500 mg | PEN_INJECTOR | SUBCUTANEOUS | 0 refills | Status: DC

## 2021-05-05 NOTE — Patient Instructions (Addendum)
Thank you for coming to see me today. It was a pleasure. Today we discussed your diabetes and weight loss. You are doing fantastic, you should be so proud! I recommend: -STOP metformin, STOP any insulin -Increase ozempic 0.75mg   in 2 weeks -Continue Jardiance  -Reduced Lisinopril to 5mg , if you are still feeling dizzy then sto, let your kidney dr know. We will check this again next week  Please follow-up with me in 1 week  If you have any questions or concerns, please do not hesitate to call the office at [phone].  Best wishes,   Dr Posey Pronto

## 2021-05-06 ENCOUNTER — Telehealth: Payer: Self-pay

## 2021-05-06 NOTE — Telephone Encounter (Signed)
Received phone call from pharmacy regarding increase in Ozempic dosage. Per pharmacist, insurance will not cover .75mg , they will only cover 0.5 and 1.0 mg weekly.   Please advise which dosage should be dispensed.   Talbot Grumbling, RN

## 2021-05-07 ENCOUNTER — Other Ambulatory Visit: Payer: Self-pay | Admitting: Family Medicine

## 2021-05-07 DIAGNOSIS — E1169 Type 2 diabetes mellitus with other specified complication: Secondary | ICD-10-CM

## 2021-05-07 MED ORDER — OZEMPIC (1 MG/DOSE) 4 MG/3ML ~~LOC~~ SOPN: 1.0000 mg | PEN_INJECTOR | SUBCUTANEOUS | 0 refills | Status: DC

## 2021-05-07 NOTE — Telephone Encounter (Signed)
Sent in correct dose of ozempic. Please could you inform the patient? Thank you!

## 2021-05-07 NOTE — Telephone Encounter (Signed)
Pt informed. Adraine Biffle T Geramy Lamorte, CMA  

## 2021-05-07 NOTE — Assessment & Plan Note (Signed)
A1c 6.2, excellent control. Goal 7-8. Congratulated pt on his excellent efforts. Recommend stopping metformin, lantus and short acting insulin. Continue Ozempic at increased dose 1mg  and Jardiance 25mg . Diabetic follow up in 3 months.

## 2021-05-07 NOTE — Assessment & Plan Note (Signed)
Further 6lb weight loss since previous visit. Congratulated pt. Ozempic increased to 1mg  today. Follow up in 1 month.

## 2021-05-07 NOTE — Assessment & Plan Note (Signed)
BP 116/54, his home readings have been soft too in 100s. Pt is feeling dizzy on current dose of Lisinopril. Reduced to 5mg  today. Recommended stopping this medication if he continues to feel dizzy. Recommended he inform his nephrologist.

## 2021-05-12 ENCOUNTER — Encounter: Payer: Self-pay | Admitting: Family Medicine

## 2021-05-12 ENCOUNTER — Telehealth: Payer: Self-pay

## 2021-05-12 ENCOUNTER — Ambulatory Visit (INDEPENDENT_AMBULATORY_CARE_PROVIDER_SITE_OTHER): Payer: Medicare Other | Admitting: Family Medicine

## 2021-05-12 ENCOUNTER — Other Ambulatory Visit: Payer: Self-pay

## 2021-05-12 DIAGNOSIS — L304 Erythema intertrigo: Secondary | ICD-10-CM | POA: Diagnosis not present

## 2021-05-12 DIAGNOSIS — Z794 Long term (current) use of insulin: Secondary | ICD-10-CM | POA: Diagnosis not present

## 2021-05-12 DIAGNOSIS — B0230 Zoster ocular disease, unspecified: Secondary | ICD-10-CM | POA: Diagnosis not present

## 2021-05-12 DIAGNOSIS — R252 Cramp and spasm: Secondary | ICD-10-CM | POA: Diagnosis not present

## 2021-05-12 DIAGNOSIS — E1122 Type 2 diabetes mellitus with diabetic chronic kidney disease: Secondary | ICD-10-CM

## 2021-05-12 DIAGNOSIS — N183 Chronic kidney disease, stage 3 unspecified: Secondary | ICD-10-CM | POA: Diagnosis not present

## 2021-05-12 DIAGNOSIS — G4733 Obstructive sleep apnea (adult) (pediatric): Secondary | ICD-10-CM

## 2021-05-12 DIAGNOSIS — C61 Malignant neoplasm of prostate: Secondary | ICD-10-CM

## 2021-05-12 DIAGNOSIS — F419 Anxiety disorder, unspecified: Secondary | ICD-10-CM | POA: Diagnosis not present

## 2021-05-12 MED ORDER — TERBINAFINE HCL 1 % EX CREA: 1.0000 "application " | TOPICAL_CREAM | Freq: Two times a day (BID) | CUTANEOUS | 0 refills | Status: AC

## 2021-05-12 MED ORDER — POTASSIUM CHLORIDE CRYS ER 20 MEQ PO TBCR: 40.0000 meq | EXTENDED_RELEASE_TABLET | ORAL | 0 refills | Status: DC | PRN

## 2021-05-12 MED ORDER — GABAPENTIN 300 MG PO CAPS: 300.0000 mg | ORAL_CAPSULE | Freq: Three times a day (TID) | ORAL | 3 refills | Status: DC

## 2021-05-12 NOTE — Progress Notes (Addendum)
     SUBJECTIVE:   CHIEF COMPLAINT / HPI:   Adam [name] is a 76 y.o. male presents to [facility]   Personal goals/current concerns -Weight loss, currently on ozempic for diabetes. Total weight loss 18.4lb so far. Also changed his diet to low carb, high protein   -Leg cramps-he notices bilateral leg cramp when he takes torsemide a few times month for peripheral edema. Gabapentin helps sometimes. Takes gabapentin 300mg , 300mg  afternoon, 600mg  evening  -Rash under abdomen, not responding to nystatin   H/O prostate cancer In remission. No urinary sx. Would like his PSA checked every year.  Anxiety Takes sertaline 50mg . Following loss of 2 family members. Doing well. Will aim to wean off this in the future.  OSA Uses CPAP at night. Tolerating well.  [facility] Office Visit from 05/12/2021 in [place]  PHQ-9 Total Score 0         Medication reviewed and updated  PERTINENT  PMH / PSH: DM, obesity, prostate cancer  OBJECTIVE:   BP 118/60   Pulse 82   Ht 6\' 5"  (1.956 m)   Wt (!) 304 lb (137.9 kg)   SpO2 98%   BMI 36.05 kg/m    General: Alert, no acute distress, pleasant  Cardio: well perfused  Pulm: normal work of breathing Abdomen: cord shaped hypertrophied skin lesions with areas of hyperpigmentation  Neuro: Cranial nerves grossly intact   ASSESSMENT/PLAN:   PROSTATE CANCER In remission. Most recent PSA 0.2 in April 22. Continue to monitor yearly.   Leg cramps Possibly due to hypokalemia when taking Torsemide for peripheral edema. Recommended 7meq KCl when he takes Toresmide. Continue Gabapentin 300mg  am, 300mg  afternoon, 600mg  evening.  Anxiety Stable, continue Sertraline 50mg . Aim in wean off this in the future per patient's request.   OBSTRUCTIVE SLEEP APNEA Stable. Continue CPAP.  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 Continue acyclovir 400mg  BID. Follows Dr Katy Fitch.   DM (diabetes mellitus)  ([place]) Congratulated pt on excellent diabetic control and weight loss. Diabetic teaching on target CBG levels and explained reasoning for stopping insulin. Increase ozempic 1mg  in 2 weeks.   Intertrigo Prescribed lamisil cream to apply to affected areas under abdomen. Follow up as needed.      Adam Haw, MD PGY-3 [place]

## 2021-05-12 NOTE — Telephone Encounter (Signed)
Signature needs to be corrected and then resent to pharmacy per Dr. Owens Shark. Nico Rogness Zimmerman Rumple, CMA

## 2021-05-12 NOTE — Telephone Encounter (Signed)
Gabapentin 300mg  am, 300mg  afternoon and 600mg  evening. I did write this in the comment section for the pharmacy. Please could you call the pharmacy? Thank you

## 2021-05-12 NOTE — Patient Instructions (Addendum)
Thank you for coming to see me today. It was a pleasure. Today we discussed multiple things: -Rash under belly, could be a fungal infection. Try lamisal cream -Leg cramps, continue Gabapentin, might be due to low potassium. I will prescribe you potassium pills to take when you take Torsemide only -We will check PSA next year, it was normal this year  Please follow-up with me in 4-5 weeks   Keep up the great work! I am proud of you!  If you have any questions or concerns, please do not hesitate to call the office at [phone].  Best wishes,   Dr Posey Pronto

## 2021-05-12 NOTE — Telephone Encounter (Signed)
Pharmacy calls nurse line requesting clarification on Gabapentin prescription. Pharmacist reports conflicting information. Please call pharmacy to clarify.

## 2021-05-13 ENCOUNTER — Other Ambulatory Visit: Payer: Self-pay | Admitting: Family Medicine

## 2021-05-13 MED ORDER — GABAPENTIN 300 MG PO CAPS: 300.0000 mg | ORAL_CAPSULE | Freq: Two times a day (BID) | ORAL | 3 refills | Status: DC

## 2021-05-13 MED ORDER — GABAPENTIN 300 MG PO CAPS: 600.0000 mg | ORAL_CAPSULE | Freq: Every evening | ORAL | 3 refills | Status: DC

## 2021-05-13 NOTE — Telephone Encounter (Signed)
I have sent in 2 separate prescriptions as he takes a higher dose in the evening.

## 2021-05-17 DIAGNOSIS — F419 Anxiety disorder, unspecified: Secondary | ICD-10-CM | POA: Insufficient documentation

## 2021-05-17 DIAGNOSIS — B0230 Zoster ocular disease, unspecified: Secondary | ICD-10-CM | POA: Insufficient documentation

## 2021-05-17 NOTE — Assessment & Plan Note (Signed)
Stable.  -Continue CPAP

## 2021-05-17 NOTE — Assessment & Plan Note (Addendum)
Possibly due to hypokalemia when taking Torsemide for peripheral edema. Recommended 64meq KCl when he takes Toresmide. Continue Gabapentin 300mg  am, 300mg  afternoon, 600mg  evening.

## 2021-05-17 NOTE — Assessment & Plan Note (Signed)
In remission. Most recent PSA 0.2 in April 22. Continue to monitor yearly.

## 2021-05-17 NOTE — Assessment & Plan Note (Signed)
Occular herpes of left eye. Chronic, stable. Continue acyclovir 400mg  BID. Follows Dr Katy Fitch.

## 2021-05-17 NOTE — Assessment & Plan Note (Signed)
Congratulated pt on excellent diabetic control and weight loss. Diabetic teaching on target CBG levels and explained reasoning for stopping insulin. Increase ozempic 1mg  in 2 weeks.

## 2021-05-17 NOTE — Assessment & Plan Note (Signed)
Stable, continue Sertraline 50mg . Aim in wean off this in the future per patient's request.

## 2021-05-18 DIAGNOSIS — L304 Erythema intertrigo: Secondary | ICD-10-CM | POA: Insufficient documentation

## 2021-05-18 NOTE — Assessment & Plan Note (Signed)
Prescribed lamisil cream to apply to affected areas under abdomen. Follow up as needed.

## 2021-05-22 ENCOUNTER — Other Ambulatory Visit: Payer: Self-pay | Admitting: Family Medicine

## 2021-05-22 DIAGNOSIS — I25118 Atherosclerotic heart disease of native coronary artery with other forms of angina pectoris: Secondary | ICD-10-CM

## 2021-05-25 ENCOUNTER — Other Ambulatory Visit: Payer: Self-pay

## 2021-05-25 ENCOUNTER — Encounter (INDEPENDENT_AMBULATORY_CARE_PROVIDER_SITE_OTHER): Payer: Medicare Other | Admitting: Ophthalmology

## 2021-05-25 DIAGNOSIS — E113311 Type 2 diabetes mellitus with moderate nonproliferative diabetic retinopathy with macular edema, right eye: Secondary | ICD-10-CM | POA: Diagnosis not present

## 2021-05-25 DIAGNOSIS — I10 Essential (primary) hypertension: Secondary | ICD-10-CM

## 2021-05-25 DIAGNOSIS — H35033 Hypertensive retinopathy, bilateral: Secondary | ICD-10-CM | POA: Diagnosis not present

## 2021-05-25 DIAGNOSIS — H43813 Vitreous degeneration, bilateral: Secondary | ICD-10-CM

## 2021-05-25 DIAGNOSIS — E113392 Type 2 diabetes mellitus with moderate nonproliferative diabetic retinopathy without macular edema, left eye: Secondary | ICD-10-CM

## 2021-05-25 DIAGNOSIS — H2513 Age-related nuclear cataract, bilateral: Secondary | ICD-10-CM | POA: Diagnosis not present

## 2021-05-25 DIAGNOSIS — D3132 Benign neoplasm of left choroid: Secondary | ICD-10-CM | POA: Diagnosis not present

## 2021-05-26 DIAGNOSIS — G4733 Obstructive sleep apnea (adult) (pediatric): Secondary | ICD-10-CM | POA: Diagnosis not present

## 2021-05-27 ENCOUNTER — Other Ambulatory Visit: Payer: Self-pay | Admitting: Family Medicine

## 2021-05-29 ENCOUNTER — Other Ambulatory Visit: Payer: Self-pay | Admitting: Family Medicine

## 2021-06-03 ENCOUNTER — Other Ambulatory Visit: Payer: Self-pay | Admitting: Family Medicine

## 2021-06-18 ENCOUNTER — Other Ambulatory Visit: Payer: Self-pay | Admitting: Family Medicine

## 2021-06-19 ENCOUNTER — Ambulatory Visit: Payer: Medicare Other | Admitting: Family Medicine

## 2021-06-22 ENCOUNTER — Other Ambulatory Visit: Payer: Self-pay

## 2021-06-22 ENCOUNTER — Encounter (INDEPENDENT_AMBULATORY_CARE_PROVIDER_SITE_OTHER): Payer: Medicare Other | Admitting: Ophthalmology

## 2021-06-22 DIAGNOSIS — E113311 Type 2 diabetes mellitus with moderate nonproliferative diabetic retinopathy with macular edema, right eye: Secondary | ICD-10-CM

## 2021-06-22 DIAGNOSIS — H35033 Hypertensive retinopathy, bilateral: Secondary | ICD-10-CM | POA: Diagnosis not present

## 2021-06-22 DIAGNOSIS — H43813 Vitreous degeneration, bilateral: Secondary | ICD-10-CM

## 2021-06-22 DIAGNOSIS — E113392 Type 2 diabetes mellitus with moderate nonproliferative diabetic retinopathy without macular edema, left eye: Secondary | ICD-10-CM

## 2021-06-22 DIAGNOSIS — I10 Essential (primary) hypertension: Secondary | ICD-10-CM

## 2021-06-22 DIAGNOSIS — D3132 Benign neoplasm of left choroid: Secondary | ICD-10-CM | POA: Diagnosis not present

## 2021-06-22 DIAGNOSIS — G4733 Obstructive sleep apnea (adult) (pediatric): Secondary | ICD-10-CM | POA: Diagnosis not present

## 2021-07-01 ENCOUNTER — Other Ambulatory Visit: Payer: Self-pay | Admitting: Family Medicine

## 2021-07-01 DIAGNOSIS — H25813 Combined forms of age-related cataract, bilateral: Secondary | ICD-10-CM | POA: Diagnosis not present

## 2021-07-01 DIAGNOSIS — Z794 Long term (current) use of insulin: Secondary | ICD-10-CM | POA: Diagnosis not present

## 2021-07-01 DIAGNOSIS — E113293 Type 2 diabetes mellitus with mild nonproliferative diabetic retinopathy without macular edema, bilateral: Secondary | ICD-10-CM | POA: Diagnosis not present

## 2021-07-01 DIAGNOSIS — H43813 Vitreous degeneration, bilateral: Secondary | ICD-10-CM | POA: Diagnosis not present

## 2021-07-17 ENCOUNTER — Other Ambulatory Visit: Payer: Self-pay | Admitting: Family Medicine

## 2021-07-20 ENCOUNTER — Other Ambulatory Visit: Payer: Self-pay

## 2021-07-20 ENCOUNTER — Encounter (INDEPENDENT_AMBULATORY_CARE_PROVIDER_SITE_OTHER): Payer: Medicare Other | Admitting: Ophthalmology

## 2021-07-20 DIAGNOSIS — D3132 Benign neoplasm of left choroid: Secondary | ICD-10-CM

## 2021-07-20 DIAGNOSIS — E113311 Type 2 diabetes mellitus with moderate nonproliferative diabetic retinopathy with macular edema, right eye: Secondary | ICD-10-CM

## 2021-07-20 DIAGNOSIS — H43813 Vitreous degeneration, bilateral: Secondary | ICD-10-CM | POA: Diagnosis not present

## 2021-07-20 DIAGNOSIS — I10 Essential (primary) hypertension: Secondary | ICD-10-CM | POA: Diagnosis not present

## 2021-07-20 DIAGNOSIS — H35033 Hypertensive retinopathy, bilateral: Secondary | ICD-10-CM

## 2021-07-20 DIAGNOSIS — E113392 Type 2 diabetes mellitus with moderate nonproliferative diabetic retinopathy without macular edema, left eye: Secondary | ICD-10-CM | POA: Diagnosis not present

## 2021-07-24 ENCOUNTER — Other Ambulatory Visit: Payer: Self-pay | Admitting: Family Medicine

## 2021-07-28 ENCOUNTER — Other Ambulatory Visit: Payer: Self-pay

## 2021-07-28 ENCOUNTER — Ambulatory Visit (INDEPENDENT_AMBULATORY_CARE_PROVIDER_SITE_OTHER): Payer: Medicare Other | Admitting: Family Medicine

## 2021-07-28 ENCOUNTER — Other Ambulatory Visit: Payer: Self-pay | Admitting: Family Medicine

## 2021-07-28 VITALS — BP 143/72 | HR 64 | Wt 280.6 lb

## 2021-07-28 DIAGNOSIS — Z0000 Encounter for general adult medical examination without abnormal findings: Secondary | ICD-10-CM | POA: Diagnosis not present

## 2021-07-28 DIAGNOSIS — N183 Chronic kidney disease, stage 3 unspecified: Secondary | ICD-10-CM | POA: Diagnosis not present

## 2021-07-28 DIAGNOSIS — E1122 Type 2 diabetes mellitus with diabetic chronic kidney disease: Secondary | ICD-10-CM

## 2021-07-28 DIAGNOSIS — Z23 Encounter for immunization: Secondary | ICD-10-CM | POA: Diagnosis not present

## 2021-07-28 DIAGNOSIS — Z794 Long term (current) use of insulin: Secondary | ICD-10-CM

## 2021-07-28 LAB — POCT GLYCOSYLATED HEMOGLOBIN (HGB A1C): HbA1c, POC (controlled diabetic range): 8.2 % — AB (ref 0.0–7.0)

## 2021-07-28 MED ORDER — INSULIN DETEMIR 100 UNIT/ML ~~LOC~~ SOLN: 5.0000 [IU] | Freq: Every day | SUBCUTANEOUS | 11 refills | Status: DC

## 2021-07-28 MED ORDER — INSULIN GLARGINE 100 UNIT/ML ~~LOC~~ SOLN: 5.0000 [IU] | SUBCUTANEOUS | 11 refills | Status: AC

## 2021-07-28 NOTE — Progress Notes (Signed)
     SUBJECTIVE:   CHIEF COMPLAINT / HPI:   Adam [name] is a 75 y.o. male presents for diabetes follow up    Diabetes Patient's current diabetic medications include Jardiance and Ozempic. Tolerating well without side effects.  Patient endorses compliance with these medications.   Patient's last A1c was  Lab Results  Component Value Date   HGBA1C 8.2 (A) 07/28/2021   HGBA1C 6.2 05/05/2021   HGBA1C 7.0 02/09/2021  Denies abdominal pain, blurred vision, polyuria, polydipsia, hypoglycemia. Patient states they understand that diet and exercise can help with her diabetes.    Last Microalbumin, LDL, Creatinine: Lab Results  Component Value Date   MICROALBUR 5.94 (H) 02/25/2012   LDLCALC 65 02/09/2021   CREATININE 1.54 (H) 02/09/2021     Flowsheet Row Office Visit from 05/12/2021 in [facility]  PHQ-9 Total Score 0       PERTINENT  PMH / PSH: DM, HLD   OBJECTIVE:   BP (!) 143/72   Pulse 64   Wt 280 lb 9.6 oz (127.3 kg)   SpO2 95%   BMI 33.27 kg/m    General: Alert, no acute distress Cardio: well pefused  Pulm: normal work of breathing Neuro: Cranial nerves grossly intact   ASSESSMENT/PLAN:   DM (diabetes mellitus) ([facility]) A1c 8.2 today, worse from 6.2 2 months ago. Likely in setting if being off insulin. This is unusual as pt continues to lose weight on Ozempic and adheres low carbohydrate diet. Restarted lantus at 5 units daily. Recommended pt increases lantus by 2 units daily when CBG >150. Follow up in 2 weeks with CBG diary. Continue Jardiance and Ozempix.  Health care maintenance Pt received flu vaccine today.    Lattie Haw, MD PGY-3 [place]

## 2021-07-28 NOTE — Patient Instructions (Signed)
Thank you for coming to see me today. It was a pleasure. Today we discussed your diabetes. Your a1c is a little high. Start lantus 5 units, for every day that you are above 150 add 2 units lantus. Keep a diary of fasting sugars.  Please follow-up with me in 2 weeks   If you have any questions or concerns, please do not hesitate to call the office at [phone].  Best wishes,   Dr Posey Pronto

## 2021-07-30 ENCOUNTER — Other Ambulatory Visit: Payer: Self-pay | Admitting: Family Medicine

## 2021-08-02 ENCOUNTER — Encounter: Payer: Self-pay | Admitting: Family Medicine

## 2021-08-02 NOTE — Assessment & Plan Note (Signed)
Pt received flu vaccine today.

## 2021-08-02 NOTE — Assessment & Plan Note (Signed)
A1c 8.2 today, worse from 6.2 2 months ago. Likely in setting if being off insulin. This is unusual as pt continues to lose weight on Ozempic and adheres low carbohydrate diet. Restarted lantus at 5 units daily. Recommended pt increases lantus by 2 units daily when CBG >150. Follow up in 2 weeks with CBG diary. Continue Jardiance and Ozempix.

## 2021-08-13 ENCOUNTER — Telehealth: Payer: Self-pay

## 2021-08-13 NOTE — Telephone Encounter (Signed)
Patient calls nurse line to inform provider that he has had to increase Lantus to 35-40 units to have adequate blood sugar control.   Patient also reports that he can not come into the office at this time due to daughter testing positive for COVID.   Talbot Grumbling, RN

## 2021-08-17 ENCOUNTER — Other Ambulatory Visit: Payer: Self-pay

## 2021-08-17 ENCOUNTER — Encounter (INDEPENDENT_AMBULATORY_CARE_PROVIDER_SITE_OTHER): Payer: Medicare Other | Admitting: Ophthalmology

## 2021-08-17 DIAGNOSIS — H43813 Vitreous degeneration, bilateral: Secondary | ICD-10-CM

## 2021-08-17 DIAGNOSIS — E113292 Type 2 diabetes mellitus with mild nonproliferative diabetic retinopathy without macular edema, left eye: Secondary | ICD-10-CM

## 2021-08-17 DIAGNOSIS — D3132 Benign neoplasm of left choroid: Secondary | ICD-10-CM

## 2021-08-17 DIAGNOSIS — I10 Essential (primary) hypertension: Secondary | ICD-10-CM | POA: Diagnosis not present

## 2021-08-17 DIAGNOSIS — E113311 Type 2 diabetes mellitus with moderate nonproliferative diabetic retinopathy with macular edema, right eye: Secondary | ICD-10-CM | POA: Diagnosis not present

## 2021-08-17 DIAGNOSIS — H35033 Hypertensive retinopathy, bilateral: Secondary | ICD-10-CM | POA: Diagnosis not present

## 2021-08-21 ENCOUNTER — Other Ambulatory Visit: Payer: Self-pay | Admitting: Family Medicine

## 2021-08-21 DIAGNOSIS — I25118 Atherosclerotic heart disease of native coronary artery with other forms of angina pectoris: Secondary | ICD-10-CM

## 2021-08-24 ENCOUNTER — Other Ambulatory Visit: Payer: Self-pay | Admitting: Family Medicine

## 2021-08-27 ENCOUNTER — Other Ambulatory Visit: Payer: Self-pay | Admitting: Family Medicine

## 2021-08-27 DIAGNOSIS — H2511 Age-related nuclear cataract, right eye: Secondary | ICD-10-CM | POA: Diagnosis not present

## 2021-09-14 ENCOUNTER — Other Ambulatory Visit: Payer: Self-pay

## 2021-09-14 ENCOUNTER — Encounter (INDEPENDENT_AMBULATORY_CARE_PROVIDER_SITE_OTHER): Payer: Medicare Other | Admitting: Ophthalmology

## 2021-09-14 DIAGNOSIS — H35033 Hypertensive retinopathy, bilateral: Secondary | ICD-10-CM | POA: Diagnosis not present

## 2021-09-14 DIAGNOSIS — I10 Essential (primary) hypertension: Secondary | ICD-10-CM

## 2021-09-14 DIAGNOSIS — E113292 Type 2 diabetes mellitus with mild nonproliferative diabetic retinopathy without macular edema, left eye: Secondary | ICD-10-CM | POA: Diagnosis not present

## 2021-09-14 DIAGNOSIS — E113311 Type 2 diabetes mellitus with moderate nonproliferative diabetic retinopathy with macular edema, right eye: Secondary | ICD-10-CM

## 2021-09-14 DIAGNOSIS — D3132 Benign neoplasm of left choroid: Secondary | ICD-10-CM

## 2021-09-14 DIAGNOSIS — H43813 Vitreous degeneration, bilateral: Secondary | ICD-10-CM | POA: Diagnosis not present

## 2021-09-18 ENCOUNTER — Other Ambulatory Visit: Payer: Self-pay | Admitting: Family Medicine

## 2021-09-21 DIAGNOSIS — G4733 Obstructive sleep apnea (adult) (pediatric): Secondary | ICD-10-CM | POA: Diagnosis not present

## 2021-09-23 ENCOUNTER — Other Ambulatory Visit: Payer: Self-pay | Admitting: Family Medicine

## 2021-10-02 ENCOUNTER — Other Ambulatory Visit: Payer: Self-pay | Admitting: Family Medicine

## 2021-10-16 ENCOUNTER — Other Ambulatory Visit: Payer: Self-pay

## 2021-10-16 ENCOUNTER — Encounter (INDEPENDENT_AMBULATORY_CARE_PROVIDER_SITE_OTHER): Payer: Medicare Other | Admitting: Ophthalmology

## 2021-10-16 DIAGNOSIS — E113311 Type 2 diabetes mellitus with moderate nonproliferative diabetic retinopathy with macular edema, right eye: Secondary | ICD-10-CM

## 2021-10-16 DIAGNOSIS — H35033 Hypertensive retinopathy, bilateral: Secondary | ICD-10-CM | POA: Diagnosis not present

## 2021-10-16 DIAGNOSIS — E113292 Type 2 diabetes mellitus with mild nonproliferative diabetic retinopathy without macular edema, left eye: Secondary | ICD-10-CM | POA: Diagnosis not present

## 2021-10-16 DIAGNOSIS — D3132 Benign neoplasm of left choroid: Secondary | ICD-10-CM

## 2021-10-16 DIAGNOSIS — H43813 Vitreous degeneration, bilateral: Secondary | ICD-10-CM | POA: Diagnosis not present

## 2021-10-16 DIAGNOSIS — I10 Essential (primary) hypertension: Secondary | ICD-10-CM | POA: Diagnosis not present

## 2021-10-19 ENCOUNTER — Encounter (INDEPENDENT_AMBULATORY_CARE_PROVIDER_SITE_OTHER): Payer: Medicare Other | Admitting: Ophthalmology

## 2021-10-19 ENCOUNTER — Other Ambulatory Visit: Payer: Self-pay | Admitting: Family Medicine

## 2021-10-23 DIAGNOSIS — H2512 Age-related nuclear cataract, left eye: Secondary | ICD-10-CM | POA: Diagnosis not present

## 2021-10-28 ENCOUNTER — Other Ambulatory Visit: Payer: Self-pay | Admitting: Family Medicine

## 2021-10-29 DIAGNOSIS — H2512 Age-related nuclear cataract, left eye: Secondary | ICD-10-CM | POA: Diagnosis not present

## 2021-10-30 ENCOUNTER — Other Ambulatory Visit: Payer: Self-pay | Admitting: Family Medicine

## 2021-11-04 ENCOUNTER — Ambulatory Visit: Payer: Medicare Other

## 2021-11-11 DIAGNOSIS — Z7984 Long term (current) use of oral hypoglycemic drugs: Secondary | ICD-10-CM | POA: Diagnosis not present

## 2021-11-11 DIAGNOSIS — K219 Gastro-esophageal reflux disease without esophagitis: Secondary | ICD-10-CM | POA: Diagnosis not present

## 2021-11-11 DIAGNOSIS — Z7985 Long-term (current) use of injectable non-insulin antidiabetic drugs: Secondary | ICD-10-CM | POA: Diagnosis not present

## 2021-11-11 DIAGNOSIS — I129 Hypertensive chronic kidney disease with stage 1 through stage 4 chronic kidney disease, or unspecified chronic kidney disease: Secondary | ICD-10-CM | POA: Diagnosis not present

## 2021-11-11 DIAGNOSIS — Z794 Long term (current) use of insulin: Secondary | ICD-10-CM | POA: Diagnosis not present

## 2021-11-13 ENCOUNTER — Other Ambulatory Visit: Payer: Self-pay | Admitting: Family Medicine

## 2021-11-17 ENCOUNTER — Ambulatory Visit (INDEPENDENT_AMBULATORY_CARE_PROVIDER_SITE_OTHER): Payer: Medicare Other

## 2021-11-17 ENCOUNTER — Other Ambulatory Visit: Payer: Self-pay

## 2021-11-17 VITALS — Ht 76.0 in | Wt 274.0 lb

## 2021-11-17 DIAGNOSIS — Z0000 Encounter for general adult medical examination without abnormal findings: Secondary | ICD-10-CM
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
## 2021-11-17 NOTE — Patient Instructions (Addendum)
You spoke to Adam [name], [place] over the phone for your annual wellness visit.  We discussed goals:   Goals       Acknowledge receipt of Insurance underwriter provided.      HEMOGLOBIN A1C < 7.6       We also discussed recommended health maintenance.  As discussed, you are due for:  Health Maintenance  Topic Date Due   Zoster Vaccines- Shingrix (1 of 2) Never done   COVID-19 Vaccine (4 - Booster for Moderna series) 11/17/2020   OPHTHALMOLOGY EXAM  12/08/2021   HEMOGLOBIN A1C  01/25/2022   FOOT EXAM  04/03/2022   COLONOSCOPY (Pts 45-85yrs Insurance coverage will need to be confirmed)  11/01/2022   TETANUS/TDAP  10/09/2024   Pneumonia Vaccine 18+ Years old  Completed   INFLUENZA VACCINE  Completed   Hepatitis C Screening  Completed   HPV VACCINES  Aged Out   PCP apt scheduled for 2/2 for diabetes management.  Bivalent Booster due.  Fill out advance directive.  Eye exam scheduled for 1/27. Preventive Care 15 Years and Older, Male Preventive care refers to lifestyle choices and visits with your health care provider that can promote health and wellness. Preventive care visits are also called wellness exams. What can I expect for my preventive care visit? Counseling During your preventive care visit, your health care provider may ask about your: Medical history, including: Past medical problems. Family medical history. History of falls. Current health, including: Emotional well-being. Home life and relationship well-being. Sexual activity. Memory and ability to understand (cognition). Lifestyle, including: Alcohol, nicotine or tobacco, and drug use. Access to firearms. Diet, exercise, and sleep habits. Work and work Statistician. Sunscreen use. Safety issues such as seatbelt and bike helmet use. Physical exam Your health care provider will check your: Height and weight. These may be used to calculate your BMI (body mass index). BMI is a measurement  that tells if you are at a healthy weight. Waist circumference. This measures the distance around your waistline. This measurement also tells if you are at a healthy weight and may help predict your risk of certain diseases, such as type 2 diabetes and high blood pressure. Heart rate and blood pressure. Body temperature. Skin for abnormal spots. What immunizations do I need? Vaccines are usually given at various ages, according to a schedule. Your health care provider will recommend vaccines for you based on your age, medical history, and lifestyle or other factors, such as travel or where you work. What tests do I need? Screening Your health care provider may recommend screening tests for certain conditions. This may include: Lipid and cholesterol levels. Diabetes screening. This is done by checking your blood sugar (glucose) after you have not eaten for a while (fasting). Hepatitis C test. Hepatitis B test. HIV (human immunodeficiency virus) test. STI (sexually transmitted infection) testing, if you are at risk. Lung cancer screening. Colorectal cancer screening. Prostate cancer screening. Abdominal aortic aneurysm (AAA) screening. You may need this if you are a current or former smoker. Talk with your health care provider about your test results, treatment options, and if necessary, the need for more tests. Follow these instructions at home: Eating and drinking  Eat a diet that includes fresh fruits and vegetables, whole grains, lean protein, and low-fat dairy products. Limit your intake of foods with high amounts of sugar, saturated fats, and salt. Take vitamin and mineral supplements as recommended by your health care provider. Do not  drink alcohol if your health care provider tells you not to drink. If you drink alcohol: Limit how much you have to 0-2 drinks a day. Know how much alcohol is in your drink. In the [place] one drink equals one 12 oz bottle of beer (355 mL), one 5 oz  glass of wine (148 mL), or one 1 oz glass of hard liquor (44 mL). Lifestyle Brush your teeth every morning and night with fluoride toothpaste. Floss one time each day. Exercise for at least 30 minutes 5 or more days each week. Do not use any products that contain nicotine or tobacco. These products include cigarettes, chewing tobacco, and vaping devices, such as e-cigarettes. If you need help quitting, ask your health care provider. Do not use drugs. If you are sexually active, practice safe sex. Use a condom or other form of protection to prevent STIs. Take aspirin only as told by your health care provider. Make sure that you understand how much to take and what form to take. Work with your health care provider to find out whether it is safe and beneficial for you to take aspirin daily. Ask your health care provider if you need to take a cholesterol-lowering medicine (statin). Find healthy ways to manage stress, such as: Meditation, yoga, or listening to music. Journaling. Talking to a trusted person. Spending time with friends and family. Safety Always wear your seat belt while driving or riding in a vehicle. Do not drive: If you have been drinking alcohol. Do not ride with someone who has been drinking. When you are tired or distracted. While texting. If you have been using any mind-altering substances or drugs. Wear a helmet and other protective equipment during sports activities. If you have firearms in your house, make sure you follow all gun safety procedures. Minimize exposure to UV radiation to reduce your risk of skin cancer. What's next? Visit your health care provider once a year for an annual wellness visit. Ask your health care provider how often you should have your eyes and teeth checked. Stay up to date on all vaccines. This information is not intended to replace advice given to you by your health care provider. Make sure you discuss any questions you have with your  health care provider. Document Revised: 04/15/2021 Document Reviewed: 04/15/2021 Elsevier Patient Education  2022 [place] [facility]'s number is [phone]. Please call with questions or concerns about what we discussed today.

## 2021-11-17 NOTE — Progress Notes (Signed)
I have reviewed this visit and agree with the documentation.   

## 2021-11-19 ENCOUNTER — Other Ambulatory Visit: Payer: Self-pay | Admitting: Family Medicine

## 2021-11-19 DIAGNOSIS — I25118 Atherosclerotic heart disease of native coronary artery with other forms of angina pectoris: Secondary | ICD-10-CM

## 2021-11-25 DIAGNOSIS — N1832 Chronic kidney disease, stage 3b: Secondary | ICD-10-CM | POA: Diagnosis not present

## 2021-11-25 DIAGNOSIS — I509 Heart failure, unspecified: Secondary | ICD-10-CM | POA: Diagnosis not present

## 2021-11-25 DIAGNOSIS — Z7982 Long term (current) use of aspirin: Secondary | ICD-10-CM | POA: Diagnosis not present

## 2021-11-25 DIAGNOSIS — E1169 Type 2 diabetes mellitus with other specified complication: Secondary | ICD-10-CM | POA: Diagnosis not present

## 2021-11-25 DIAGNOSIS — Z7985 Long-term (current) use of injectable non-insulin antidiabetic drugs: Secondary | ICD-10-CM | POA: Diagnosis not present

## 2021-11-25 DIAGNOSIS — I130 Hypertensive heart and chronic kidney disease with heart failure and stage 1 through stage 4 chronic kidney disease, or unspecified chronic kidney disease: Secondary | ICD-10-CM | POA: Diagnosis not present

## 2021-11-25 DIAGNOSIS — Z87891 Personal history of nicotine dependence: Secondary | ICD-10-CM | POA: Diagnosis not present

## 2021-11-25 DIAGNOSIS — Z7984 Long term (current) use of oral hypoglycemic drugs: Secondary | ICD-10-CM | POA: Diagnosis not present

## 2021-11-26 DIAGNOSIS — I509 Heart failure, unspecified: Secondary | ICD-10-CM | POA: Diagnosis not present

## 2021-11-26 DIAGNOSIS — I2510 Atherosclerotic heart disease of native coronary artery without angina pectoris: Secondary | ICD-10-CM | POA: Diagnosis not present

## 2021-11-26 DIAGNOSIS — E1122 Type 2 diabetes mellitus with diabetic chronic kidney disease: Secondary | ICD-10-CM | POA: Diagnosis not present

## 2021-11-27 ENCOUNTER — Other Ambulatory Visit: Payer: Self-pay

## 2021-11-27 ENCOUNTER — Encounter (INDEPENDENT_AMBULATORY_CARE_PROVIDER_SITE_OTHER): Payer: Medicare Other | Admitting: Ophthalmology

## 2021-11-27 DIAGNOSIS — E113292 Type 2 diabetes mellitus with mild nonproliferative diabetic retinopathy without macular edema, left eye: Secondary | ICD-10-CM | POA: Diagnosis not present

## 2021-11-27 DIAGNOSIS — E113391 Type 2 diabetes mellitus with moderate nonproliferative diabetic retinopathy without macular edema, right eye: Secondary | ICD-10-CM | POA: Diagnosis not present

## 2021-11-27 DIAGNOSIS — H35033 Hypertensive retinopathy, bilateral: Secondary | ICD-10-CM | POA: Diagnosis not present

## 2021-11-27 DIAGNOSIS — H43813 Vitreous degeneration, bilateral: Secondary | ICD-10-CM | POA: Diagnosis not present

## 2021-11-27 DIAGNOSIS — I10 Essential (primary) hypertension: Secondary | ICD-10-CM

## 2021-11-27 DIAGNOSIS — D3132 Benign neoplasm of left choroid: Secondary | ICD-10-CM | POA: Diagnosis not present

## 2021-12-02 DIAGNOSIS — Z794 Long term (current) use of insulin: Secondary | ICD-10-CM | POA: Diagnosis not present

## 2021-12-02 DIAGNOSIS — K219 Gastro-esophageal reflux disease without esophagitis: Secondary | ICD-10-CM | POA: Diagnosis not present

## 2021-12-02 DIAGNOSIS — E1169 Type 2 diabetes mellitus with other specified complication: Secondary | ICD-10-CM | POA: Diagnosis not present

## 2021-12-02 DIAGNOSIS — E1151 Type 2 diabetes mellitus with diabetic peripheral angiopathy without gangrene: Secondary | ICD-10-CM | POA: Diagnosis not present

## 2021-12-02 NOTE — Progress Notes (Deleted)
° ° ° °  SUBJECTIVE:   CHIEF COMPLAINT / HPI:   Adam [name] is a 76 y.o. male presents for diabetes follow up   Diabetes Patient's current diabetic medications include***. Tolerating well without side effects.  Patient endorses compliance with these medications. CBG readings averaging in the *** range.  Patient's last A1c was  Lab Results  Component Value Date   HGBA1C 8.2 (A) 07/28/2021   HGBA1C 6.2 05/05/2021   HGBA1C 7.0 02/09/2021   on **.  Current A1c today is***.  Denies abdominal pain, blurred vision, polyuria, polydipsia, hypoglycemia ***. Patient states they understand that diet and exercise can help with her diabetes.***.    Last Microalbumin, LDL, Creatinine: Lab Results  Component Value Date   MICROALBUR 5.94 (H) 02/25/2012   LDLCALC 65 02/09/2021   CREATININE 1.54 (H) 02/09/2021     ***  Flowsheet Row Clinical Support from 11/17/2021 in [facility]  PHQ-9 Total Score 0        Health Maintenance Due  Topic   Zoster Vaccines- Shingrix (1 of 2)   COVID-19 Vaccine (4 - Booster for Moderna series)      PERTINENT  PMH / PSH:   OBJECTIVE:   There were no vitals taken for this visit.   General: Alert, no acute distress Cardio: Normal S1 and S2, RRR, no r/m/g Pulm: CTAB, normal work of breathing Abdomen: Bowel sounds normal. Abdomen soft and non-tender.  Extremities: No peripheral edema.  Neuro: Cranial nerves grossly intact   ASSESSMENT/PLAN:   No problem-specific Assessment & Plan notes found for this encounter.    Lattie Haw, MD PGY-3 [place]

## 2021-12-03 ENCOUNTER — Ambulatory Visit: Payer: Medicare Other | Admitting: Family Medicine

## 2021-12-15 ENCOUNTER — Other Ambulatory Visit: Payer: Self-pay

## 2021-12-15 ENCOUNTER — Ambulatory Visit (INDEPENDENT_AMBULATORY_CARE_PROVIDER_SITE_OTHER): Payer: Medicare Other | Admitting: Cardiovascular Disease

## 2021-12-15 VITALS — BP 126/68 | HR 64 | Ht 77.0 in | Wt 268.0 lb

## 2021-12-15 DIAGNOSIS — E785 Hyperlipidemia, unspecified: Secondary | ICD-10-CM | POA: Diagnosis not present

## 2021-12-15 DIAGNOSIS — I2510 Atherosclerotic heart disease of native coronary artery without angina pectoris: Secondary | ICD-10-CM

## 2021-12-15 DIAGNOSIS — I739 Peripheral vascular disease, unspecified: Secondary | ICD-10-CM | POA: Diagnosis not present

## 2021-12-15 DIAGNOSIS — I10 Essential (primary) hypertension: Secondary | ICD-10-CM
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
## 2021-12-15 NOTE — Patient Instructions (Addendum)
Medication Instructions:  No changes *If you need a refill on your cardiac medications before your next appointment, please call your pharmacy*   Lab Work: None ordered If you have labs (blood work) drawn today and your tests are completely normal, you will receive your results only by: [place] (if you have MyChart) OR A paper copy in the mail If you have any lab test that is abnormal or we need to change your treatment, we will call you to review the results.   Testing/Procedures: None ordered   Follow-Up: At [facility] you and your health needs are our priority.  As part of our continuing mission to provide you with exceptional heart care, we have created designated Provider Care Teams.  These Care Teams include your primary Cardiologist (physician) and Advanced Practice Providers (APPs -  Physician Assistants and Nurse Practitioners) who all work together to provide you with the care you need, when you need it.  We recommend signing up for the patient portal called "MyChart".  Sign up information is provided on this After Visit Summary.  MyChart is used to connect with patients for Virtual Visits (Telemedicine).  Patients are able to view lab/test results, encounter notes, upcoming appointments, etc.  Non-urgent messages can be sent to your provider as well.   To learn more about what you can do with MyChart, go to [email or web address].    Your next appointment:   12 month(s)  The format for your next appointment:   In Person  Provider:   Dr. Fletcher Anon

## 2021-12-17 DIAGNOSIS — G4700 Insomnia, unspecified: Secondary | ICD-10-CM | POA: Diagnosis not present

## 2021-12-26 DIAGNOSIS — G4733 Obstructive sleep apnea (adult) (pediatric): Secondary | ICD-10-CM | POA: Diagnosis not present

## 2021-12-29 DIAGNOSIS — I2510 Atherosclerotic heart disease of native coronary artery without angina pectoris: Secondary | ICD-10-CM | POA: Diagnosis not present

## 2021-12-29 DIAGNOSIS — I130 Hypertensive heart and chronic kidney disease with heart failure and stage 1 through stage 4 chronic kidney disease, or unspecified chronic kidney disease: Secondary | ICD-10-CM | POA: Diagnosis not present

## 2021-12-29 DIAGNOSIS — I509 Heart failure, unspecified: Secondary | ICD-10-CM | POA: Diagnosis not present

## 2021-12-30 DIAGNOSIS — K219 Gastro-esophageal reflux disease without esophagitis: Secondary | ICD-10-CM | POA: Diagnosis not present

## 2021-12-30 DIAGNOSIS — G4700 Insomnia, unspecified: Secondary | ICD-10-CM | POA: Diagnosis not present

## 2021-12-30 DIAGNOSIS — Z794 Long term (current) use of insulin: Secondary | ICD-10-CM | POA: Diagnosis not present

## 2021-12-30 DIAGNOSIS — Z7985 Long-term (current) use of injectable non-insulin antidiabetic drugs: Secondary | ICD-10-CM | POA: Diagnosis not present

## 2021-12-30 DIAGNOSIS — I10 Essential (primary) hypertension: Secondary | ICD-10-CM | POA: Diagnosis not present

## 2021-12-30 DIAGNOSIS — Z7984 Long term (current) use of oral hypoglycemic drugs: Secondary | ICD-10-CM | POA: Diagnosis not present

## 2021-12-30 DIAGNOSIS — E119 Type 2 diabetes mellitus without complications: Secondary | ICD-10-CM | POA: Diagnosis not present

## 2021-12-31 ENCOUNTER — Other Ambulatory Visit: Payer: Self-pay | Admitting: Family Medicine

## 2022-01-06 DIAGNOSIS — G4700 Insomnia, unspecified: Secondary | ICD-10-CM | POA: Diagnosis not present

## 2022-01-06 DIAGNOSIS — K219 Gastro-esophageal reflux disease without esophagitis: Secondary | ICD-10-CM | POA: Diagnosis not present

## 2022-01-06 DIAGNOSIS — E1122 Type 2 diabetes mellitus with diabetic chronic kidney disease: Secondary | ICD-10-CM | POA: Diagnosis not present

## 2022-01-06 DIAGNOSIS — N183 Chronic kidney disease, stage 3 unspecified: Secondary | ICD-10-CM | POA: Diagnosis not present

## 2022-01-06 DIAGNOSIS — E1169 Type 2 diabetes mellitus with other specified complication: Secondary | ICD-10-CM | POA: Diagnosis not present

## 2022-01-06 DIAGNOSIS — E1151 Type 2 diabetes mellitus with diabetic peripheral angiopathy without gangrene: Secondary | ICD-10-CM | POA: Diagnosis not present

## 2022-01-06 DIAGNOSIS — Z794 Long term (current) use of insulin: Secondary | ICD-10-CM | POA: Diagnosis not present

## 2022-01-08 ENCOUNTER — Other Ambulatory Visit: Payer: Self-pay

## 2022-01-08 ENCOUNTER — Encounter (INDEPENDENT_AMBULATORY_CARE_PROVIDER_SITE_OTHER): Payer: Medicare Other | Admitting: Ophthalmology

## 2022-01-08 DIAGNOSIS — H43813 Vitreous degeneration, bilateral: Secondary | ICD-10-CM

## 2022-01-08 DIAGNOSIS — E113292 Type 2 diabetes mellitus with mild nonproliferative diabetic retinopathy without macular edema, left eye: Secondary | ICD-10-CM

## 2022-01-08 DIAGNOSIS — D3132 Benign neoplasm of left choroid: Secondary | ICD-10-CM | POA: Diagnosis not present

## 2022-01-08 DIAGNOSIS — E113391 Type 2 diabetes mellitus with moderate nonproliferative diabetic retinopathy without macular edema, right eye: Secondary | ICD-10-CM

## 2022-01-08 DIAGNOSIS — H35033 Hypertensive retinopathy, bilateral: Secondary | ICD-10-CM | POA: Diagnosis not present

## 2022-01-08 DIAGNOSIS — I10 Essential (primary) hypertension: Secondary | ICD-10-CM

## 2022-01-19 DIAGNOSIS — I130 Hypertensive heart and chronic kidney disease with heart failure and stage 1 through stage 4 chronic kidney disease, or unspecified chronic kidney disease: Secondary | ICD-10-CM | POA: Diagnosis not present

## 2022-01-19 DIAGNOSIS — Z7985 Long-term (current) use of injectable non-insulin antidiabetic drugs: Secondary | ICD-10-CM | POA: Diagnosis not present

## 2022-01-19 DIAGNOSIS — E1151 Type 2 diabetes mellitus with diabetic peripheral angiopathy without gangrene: Secondary | ICD-10-CM | POA: Diagnosis not present

## 2022-01-19 DIAGNOSIS — Z794 Long term (current) use of insulin: Secondary | ICD-10-CM | POA: Diagnosis not present

## 2022-01-19 DIAGNOSIS — Z7984 Long term (current) use of oral hypoglycemic drugs: Secondary | ICD-10-CM | POA: Diagnosis not present

## 2022-01-27 DIAGNOSIS — I5033 Acute on chronic diastolic (congestive) heart failure: Secondary | ICD-10-CM | POA: Diagnosis not present

## 2022-01-27 DIAGNOSIS — I2510 Atherosclerotic heart disease of native coronary artery without angina pectoris: Secondary | ICD-10-CM | POA: Diagnosis not present

## 2022-01-27 DIAGNOSIS — N189 Chronic kidney disease, unspecified: Secondary | ICD-10-CM | POA: Diagnosis not present

## 2022-01-27 DIAGNOSIS — E1122 Type 2 diabetes mellitus with diabetic chronic kidney disease: Secondary | ICD-10-CM | POA: Diagnosis not present

## 2022-02-03 ENCOUNTER — Other Ambulatory Visit: Payer: Self-pay | Admitting: Family Medicine

## 2022-02-17 ENCOUNTER — Other Ambulatory Visit: Payer: Self-pay | Admitting: Family Medicine

## 2022-02-17 DIAGNOSIS — I25118 Atherosclerotic heart disease of native coronary artery with other forms of angina pectoris: Secondary | ICD-10-CM

## 2022-02-25 ENCOUNTER — Other Ambulatory Visit: Payer: Self-pay | Admitting: Family Medicine

## 2022-03-12 ENCOUNTER — Encounter (INDEPENDENT_AMBULATORY_CARE_PROVIDER_SITE_OTHER): Payer: Medicare Other | Admitting: Ophthalmology

## 2022-03-26 ENCOUNTER — Other Ambulatory Visit: Payer: Self-pay | Admitting: Family Medicine

## 2022-04-06 ENCOUNTER — Encounter: Payer: Self-pay | Admitting: *Deleted

## 2022-04-12 ENCOUNTER — Ambulatory Visit (INDEPENDENT_AMBULATORY_CARE_PROVIDER_SITE_OTHER): Payer: Medicare Other | Admitting: Podiatry

## 2022-04-12 ENCOUNTER — Ambulatory Visit (INDEPENDENT_AMBULATORY_CARE_PROVIDER_SITE_OTHER): Payer: Medicare Other

## 2022-04-12 DIAGNOSIS — L600 Ingrowing nail: Secondary | ICD-10-CM

## 2022-04-12 DIAGNOSIS — L989 Disorder of the skin and subcutaneous tissue, unspecified: Secondary | ICD-10-CM
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
## 2022-04-27 ENCOUNTER — Other Ambulatory Visit: Payer: Self-pay | Admitting: Family Medicine

## 2022-05-24 ENCOUNTER — Other Ambulatory Visit: Payer: Self-pay | Admitting: Family Medicine

## 2022-07-09 ENCOUNTER — Other Ambulatory Visit: Payer: Self-pay | Admitting: Student

## 2022-07-26 ENCOUNTER — Other Ambulatory Visit: Payer: Self-pay | Admitting: Family Medicine

## 2022-08-26 ENCOUNTER — Other Ambulatory Visit: Payer: Self-pay | Admitting: Student

## 2022-10-08 ENCOUNTER — Other Ambulatory Visit: Payer: Self-pay | Admitting: Student

## 2022-11-25 ENCOUNTER — Other Ambulatory Visit: Payer: Self-pay | Admitting: Student

## 2023-01-07 ENCOUNTER — Other Ambulatory Visit: Payer: Self-pay | Admitting: Student

## 2023-02-22 ENCOUNTER — Encounter: Payer: Self-pay | Admitting: Cardiovascular Disease

## 2023-02-22 ENCOUNTER — Ambulatory Visit: Payer: 59 | Attending: Cardiovascular Disease | Admitting: Cardiovascular Disease

## 2023-02-22 VITALS — BP 132/86 | HR 71 | Ht 76.0 in | Wt 263.4 lb

## 2023-02-22 DIAGNOSIS — I10 Essential (primary) hypertension: Secondary | ICD-10-CM

## 2023-02-22 DIAGNOSIS — E785 Hyperlipidemia, unspecified: Secondary | ICD-10-CM | POA: Diagnosis not present

## 2023-02-22 DIAGNOSIS — I739 Peripheral vascular disease, unspecified: Secondary | ICD-10-CM | POA: Diagnosis not present

## 2023-02-22 DIAGNOSIS — I2510 Atherosclerotic heart disease of native coronary artery without angina pectoris: Secondary | ICD-10-CM
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
## 2023-02-22 NOTE — Patient Instructions (Signed)
Medication Instructions:  No changes *If you need a refill on your cardiac medications before your next appointment, please call your pharmacy*   Lab Work: None ordered If you have labs (blood work) drawn today and your tests are completely normal, you will receive your results only by: MyChart Message (if you have MyChart) OR A paper copy in the mail If you have any lab test that is abnormal or we need to change your treatment, we will call you to review the results.   Testing/Procedures: None ordered   Follow-Up: At [facility], you and your health needs are our priority.  As part of our continuing mission to provide you with exceptional heart care, we have created designated Provider Care Teams.  These Care Teams include your primary Cardiologist (physician) and Advanced Practice Providers (APPs -  Physician Assistants and Nurse Practitioners) who all work together to provide you with the care you need, when you need it.  We recommend signing up for the patient portal called "MyChart".  Sign up information is provided on this After Visit Summary.  MyChart is used to connect with patients for Virtual Visits (Telemedicine).  Patients are able to view lab/test results, encounter notes, upcoming appointments, etc.  Non-urgent messages can be sent to your provider as well.   To learn more about what you can do with MyChart, go to [email or web address].    Your next appointment:   12 month(s)  Provider:   Muhammad Arida, MD       

## 2023-02-23 ENCOUNTER — Other Ambulatory Visit: Payer: Self-pay | Admitting: Student

## 2023-04-08 ENCOUNTER — Other Ambulatory Visit: Payer: Self-pay | Admitting: Student

## 2023-04-15 ENCOUNTER — Other Ambulatory Visit: Payer: Self-pay | Admitting: Student

## 2023-05-24 ENCOUNTER — Encounter: Payer: Self-pay | Admitting: Cardiovascular Disease

## 2023-06-07 ENCOUNTER — Other Ambulatory Visit: Payer: Self-pay | Admitting: Student

## 2023-07-22 ENCOUNTER — Other Ambulatory Visit: Payer: Self-pay | Admitting: Student

## 2023-07-25 ENCOUNTER — Other Ambulatory Visit: Payer: Self-pay | Admitting: Student

## 2023-09-05 ENCOUNTER — Other Ambulatory Visit: Payer: Self-pay | Admitting: Student

## 2023-10-19 ENCOUNTER — Other Ambulatory Visit: Payer: Self-pay | Admitting: Student

## 2023-11-22 ENCOUNTER — Ambulatory Visit: Payer: 59 | Attending: Cardiovascular Disease | Admitting: Cardiovascular Disease

## 2023-11-22 ENCOUNTER — Encounter: Payer: Self-pay | Admitting: Cardiovascular Disease

## 2023-11-22 VITALS — BP 124/86 | HR 70 | Ht 76.0 in | Wt 263.4 lb

## 2023-11-22 DIAGNOSIS — I739 Peripheral vascular disease, unspecified: Secondary | ICD-10-CM

## 2023-11-22 DIAGNOSIS — E785 Hyperlipidemia, unspecified: Secondary | ICD-10-CM

## 2023-11-22 DIAGNOSIS — I10 Essential (primary) hypertension: Secondary | ICD-10-CM

## 2023-11-22 DIAGNOSIS — I2510 Atherosclerotic heart disease of native coronary artery without angina pectoris: Secondary | ICD-10-CM

## 2023-11-22 NOTE — Patient Instructions (Signed)
Medication Instructions:  No changes *If you need a refill on your cardiac medications before your next appointment, please call your pharmacy*   Lab Work: None ordered If you have labs (blood work) drawn today and your tests are completely normal, you will receive your results only by: [place] (if you have MyChart) OR A paper copy in the mail If you have any lab test that is abnormal or we need to change your treatment, we will call you to review the results.   Testing/Procedures: None ordered   Follow-Up: At [facility], you and your health needs are our priority.  As part of our continuing mission to provide you with exceptional heart care, we have created designated Provider Care Teams.  These Care Teams include your primary Cardiologist (physician) and Advanced Practice Providers (APPs -  Physician Assistants and Nurse Practitioners) who all work together to provide you with the care you need, when you need it.  We recommend signing up for the patient portal called "MyChart".  Sign up information is provided on this After Visit Summary.  MyChart is used to connect with patients for Virtual Visits (Telemedicine).  Patients are able to view lab/test results, encounter notes, upcoming appointments, etc.  Non-urgent messages can be sent to your provider as well.   To learn more about what you can do with MyChart, go to [email or web address].    Your next appointment:   6 month(s)  Provider:   Kathlyn Sacramento, MD
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
## 2023-12-02 ENCOUNTER — Other Ambulatory Visit: Payer: Self-pay | Admitting: Student

## 2024-02-29 ENCOUNTER — Other Ambulatory Visit: Payer: Self-pay | Admitting: Student

## 2024-03-22 ENCOUNTER — Encounter: Payer: Self-pay | Admitting: Cardiovascular Disease

## 2024-04-10 ENCOUNTER — Encounter: Payer: Self-pay | Admitting: *Deleted

## 2024-08-07 ENCOUNTER — Ambulatory Visit: Attending: Cardiovascular Disease | Admitting: Cardiovascular Disease

## 2024-08-07 VITALS — BP 122/64 | HR 64 | Ht 76.0 in | Wt 254.0 lb

## 2024-08-07 DIAGNOSIS — I739 Peripheral vascular disease, unspecified: Secondary | ICD-10-CM

## 2024-08-07 DIAGNOSIS — E785 Hyperlipidemia, unspecified: Secondary | ICD-10-CM

## 2024-08-07 DIAGNOSIS — I2510 Atherosclerotic heart disease of native coronary artery without angina pectoris: Secondary | ICD-10-CM

## 2024-08-07 DIAGNOSIS — I10 Essential (primary) hypertension: Secondary | ICD-10-CM
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
## 2024-08-07 NOTE — Patient Instructions (Signed)
 Medication Instructions:  No changes *If you need a refill on your cardiac medications before your next appointment, please call your pharmacy*  Lab Work: None ordered  If you have labs (blood work) drawn today and your tests are completely normal, you will receive your results only by: MyChart Message (if you have MyChart) OR A paper copy in the mail If you have any lab test that is abnormal or we need to change your treatment, we will call you to review the results.  Testing/Procedures: None ordered  Follow-Up: At [facility], you and your health needs are our priority.  As part of our continuing mission to provide you with exceptional heart care, our providers are all part of one team.  This team includes your primary Cardiologist (physician) and Advanced Practice Providers or APPs (Physician Assistants and Nurse Practitioners) who all work together to provide you with the care you need, when you need it.  Your next appointment:   12 month(s)  Provider:   Antionette Kirks, MD    We recommend signing up for the patient portal called "MyChart".  Sign up information is provided on this After Visit Summary.  MyChart is used to connect with patients for Virtual Visits (Telemedicine).  Patients are able to view lab/test results, encounter notes, upcoming appointments, etc.  Non-urgent messages can be sent to your provider as well.   To learn more about what you can do with MyChart, go to [email or web address].
